# Patient Record
Sex: Female | Born: 1940
Health system: Southern US, Community
[De-identification: ages and names within clinical notes are randomized; demographics above are authoritative.]

## PROBLEM LIST (undated history)

## (undated) DIAGNOSIS — J309 Allergic rhinitis, unspecified: Secondary | ICD-10-CM

## (undated) DIAGNOSIS — E119 Type 2 diabetes mellitus without complications: Secondary | ICD-10-CM

## (undated) DIAGNOSIS — I10 Essential (primary) hypertension: Secondary | ICD-10-CM

## (undated) DIAGNOSIS — E785 Hyperlipidemia, unspecified: Secondary | ICD-10-CM

## (undated) HISTORY — DX: Allergic rhinitis, unspecified: J30.9

## (undated) HISTORY — PX: ABDOMINAL HYSTERECTOMY: SHX81

## (undated) HISTORY — PX: CATARACT EXTRACTION: SUR2

## (undated) HISTORY — DX: Hyperlipidemia, unspecified: E78.5

## (undated) HISTORY — PX: CHOLECYSTECTOMY: SHX55

## (undated) HISTORY — PX: ROTATOR CUFF REPAIR: SHX139

## (undated) HISTORY — DX: Type 2 diabetes mellitus without complications: E11.9

## (undated) HISTORY — DX: Essential (primary) hypertension: I10

---

## 2008-09-10 ENCOUNTER — Inpatient Hospital Stay (HOSPITAL_COMMUNITY): Admission: RE | Admit: 2008-09-10 | Discharge: 2008-09-13 | Payer: Self-pay | Admitting: Neurosurgery

## 2010-06-21 LAB — GLUCOSE, CAPILLARY
Glucose-Capillary: 138 mg/dL — ABNORMAL HIGH (ref 70–99)
Glucose-Capillary: 154 mg/dL — ABNORMAL HIGH (ref 70–99)
Glucose-Capillary: 196 mg/dL — ABNORMAL HIGH (ref 70–99)
Glucose-Capillary: 208 mg/dL — ABNORMAL HIGH (ref 70–99)
Glucose-Capillary: 243 mg/dL — ABNORMAL HIGH (ref 70–99)
Glucose-Capillary: 264 mg/dL — ABNORMAL HIGH (ref 70–99)

## 2010-06-22 LAB — CBC
MCV: 87.4 fL (ref 78.0–100.0)
Platelets: 278 10*3/uL (ref 150–400)
RBC: 4.63 MIL/uL (ref 3.87–5.11)
WBC: 7.7 10*3/uL (ref 4.0–10.5)

## 2010-06-22 LAB — GLUCOSE, CAPILLARY
Glucose-Capillary: 122 mg/dL — ABNORMAL HIGH (ref 70–99)
Glucose-Capillary: 125 mg/dL — ABNORMAL HIGH (ref 70–99)
Glucose-Capillary: 178 mg/dL — ABNORMAL HIGH (ref 70–99)
Glucose-Capillary: 381 mg/dL — ABNORMAL HIGH (ref 70–99)

## 2010-06-22 LAB — BASIC METABOLIC PANEL
BUN: 9 mg/dL (ref 6–23)
Chloride: 105 mEq/L (ref 96–112)
Creatinine, Ser: 0.68 mg/dL (ref 0.4–1.2)
GFR calc Af Amer: >60 mL/min (ref >60)
GFR calc non Af Amer: >60 mL/min (ref >60)
Potassium: 4.4 mEq/L (ref 3.5–5.1)

## 2010-07-28 NOTE — Discharge Summary (Signed)
NAME:  Rebecca Hodge, Rebecca Hodge               ACCOUNT NO.:  [id number]   MEDICAL RECORD NO.:  [id number]          PATIENT TYPE:  INP   LOCATION:  3031                         FACILITY:  [facility]   PHYSICIAN:  Hilda Lias, M.D.   DATE OF BIRTH:  06-15-1940   DATE OF ADMISSION:  09/10/2008  DATE OF DISCHARGE:  09/13/2008                               DISCHARGE SUMMARY   ADMISSION DIAGNOSIS:  Left L3-L4 herniated disk.   FINAL DIAGNOSIS:  Left L3-L4 herniated disk.   CLINICAL HISTORY:  The patient was in the office because of back and  left leg pain.  The patient has quite a bit of weakness of the left  quadriceps.  X-ray showed large herniated disk at L3-4.  Surgery was  advised.  Laboratory normal.   COURSE IN THE HOSPITAL:  The patient was taken to surgery and left L3-4  decompression was done.  After surgery, the patient had quite of pain  which resolved in 24 hours.  Today, she is walking, and she has minimal  back discomfort and no left leg pain.  She is being discharged today to  be followed by me in my office.   CONDITION ON DISCHARGE:  Improvement.   MEDICATIONS:  Percocet, diazepam, Neurontin.   DIET:  She will continue with preop diet.   ACTIVITY:  Not to drive for at least 2 weeks.   FOLLOWUP:  She will be seen in my office in 4 weeks or before as needed.            ______________________________  Hilda Lias, M.D.     EB/MEDQ  D:  09/13/2008  T:  09/14/2008  Job:  [id number]

## 2010-07-28 NOTE — Op Note (Signed)
NAME:  Rebecca Hodge, Rebecca Hodge               ACCOUNT NO.:  [id number]   MEDICAL RECORD NO.:  [id number]          PATIENT TYPE:  INP   LOCATION:  3031                         FACILITY:  [facility]   PHYSICIAN:  Hilda Lias, M.D.   DATE OF BIRTH:  05/26/1940   DATE OF PROCEDURE:  09/10/2008  DATE OF DISCHARGE:                               OPERATIVE REPORT   PREOPERATIVE DIAGNOSIS:  Left L3-L4 herniated disk.   POSTOPERATIVE DIAGNOSIS:  Left L3-L4 herniated disk.   PROCEDURES:  Left L3-L4 decompression of the L3 nerve root,  foraminotomy, removal of loose facet.  Microscope.   SURGEON:  Hilda Lias, MD.   CLINICAL HISTORY:  [name] is a lady who was seem in my office  yesterday complaining of back pain radiating to the left leg.  She had  failed conservative treatment.  X-ray showed that she has a herniated  disk at L3-4.  The patient wants to proceed with surgery because the  patient was telling me that the medication was not helping and she was  getting worse.  The surgery was fully explained to her and her daughter  including the possibility of no improvement, need for further surgery,  scar tissue, and CSF leak.  Also, possibility of infection.   PROCEDURE IN DETAIL:  The patient was taken to the OR and after  intubation, she was positioned on a prone manner.  The back was cleaned  with DuraPrep.  A midline incision from L3-L4 was made and muscle  retracted laterally.  X-ray was taken and showed that the clips were at  the level of L3-4 and the probe was at the level of L3-4.  We brought  the microscope into the area.  Immediately, we found that the L3 facet  was loose.  We did a laminotomy to remove the lower part of L3, the  upper of L4, and thick yellow ligament.  We tried to preserve the facet,  but it was loose and it was pushing backwards.  Removal was done.  Then,  we found that the L3 nerve root was displaced laterally.  There was a  fragment of disk plus a thick calcified  ligament compromising one of the  thecal sac as well that three L3 nerve root.  Removal of the ligament  plus some fragment was done.  We repeated the x-ray and indeed we were  in the right area.  With the probe, with look underneath and the thecal  sac of L3-1 one below L3 nerve root as well as down to L4, and there was  no evidence of any herniated disk or fragment.  There was quite a bit of  bulging disk and after we had the good decompression, there was no need  to do any diskectomy.  The area was irrigated.  Valsalva maneuver was  negative.  Then, fentanyl and Depo-Medrol were left in the epidural area  and the wound was closed with Vicryl and Steri-Strips.           ______________________________  Hilda Lias, M.D.     EB/MEDQ  D:  09/10/2008  T:  09/11/2008  Job:  [id number]

## 2010-08-13 IMAGING — CR DG CHEST 2V
2 series · 2 of 2 positions shown · non-contrast
Comparison: None

CLINICAL DATA: Preop for lumbar spine surgery

CHEST - 2 VIEW

[view not recorded (1 of 2)]
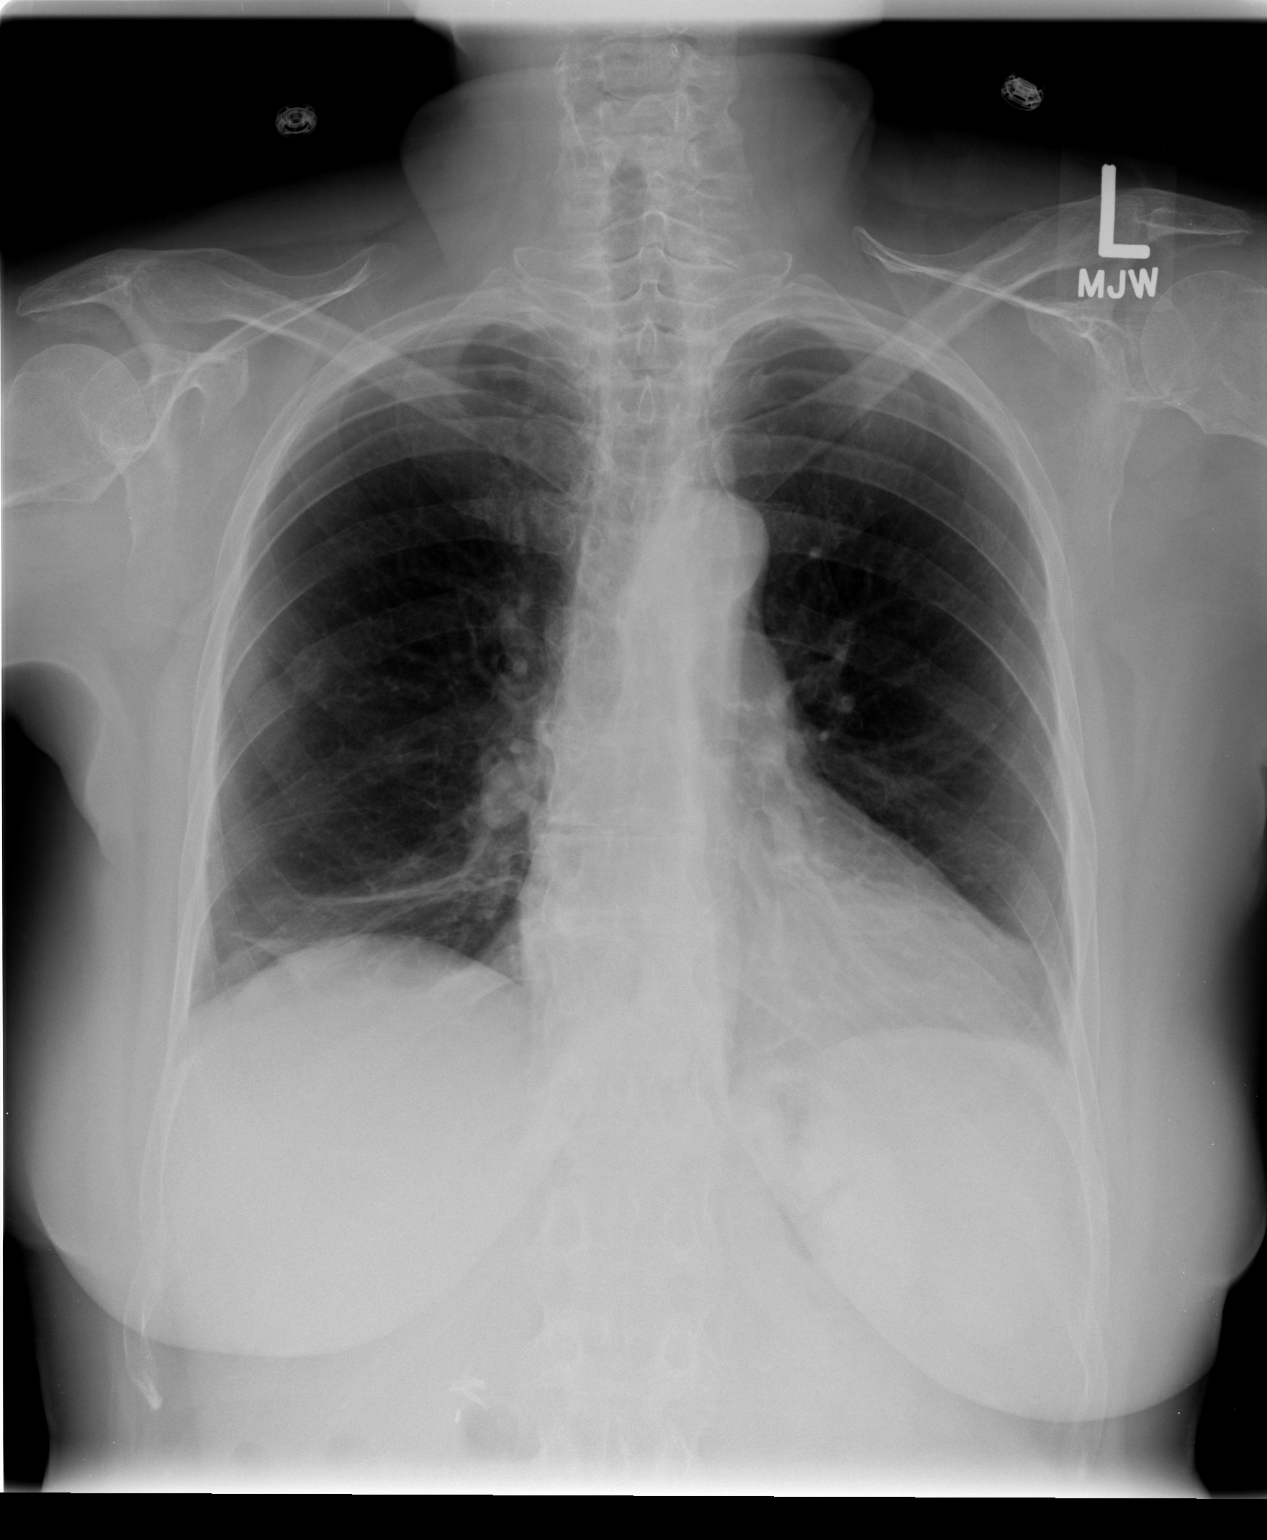

[view not recorded (2 of 2)]
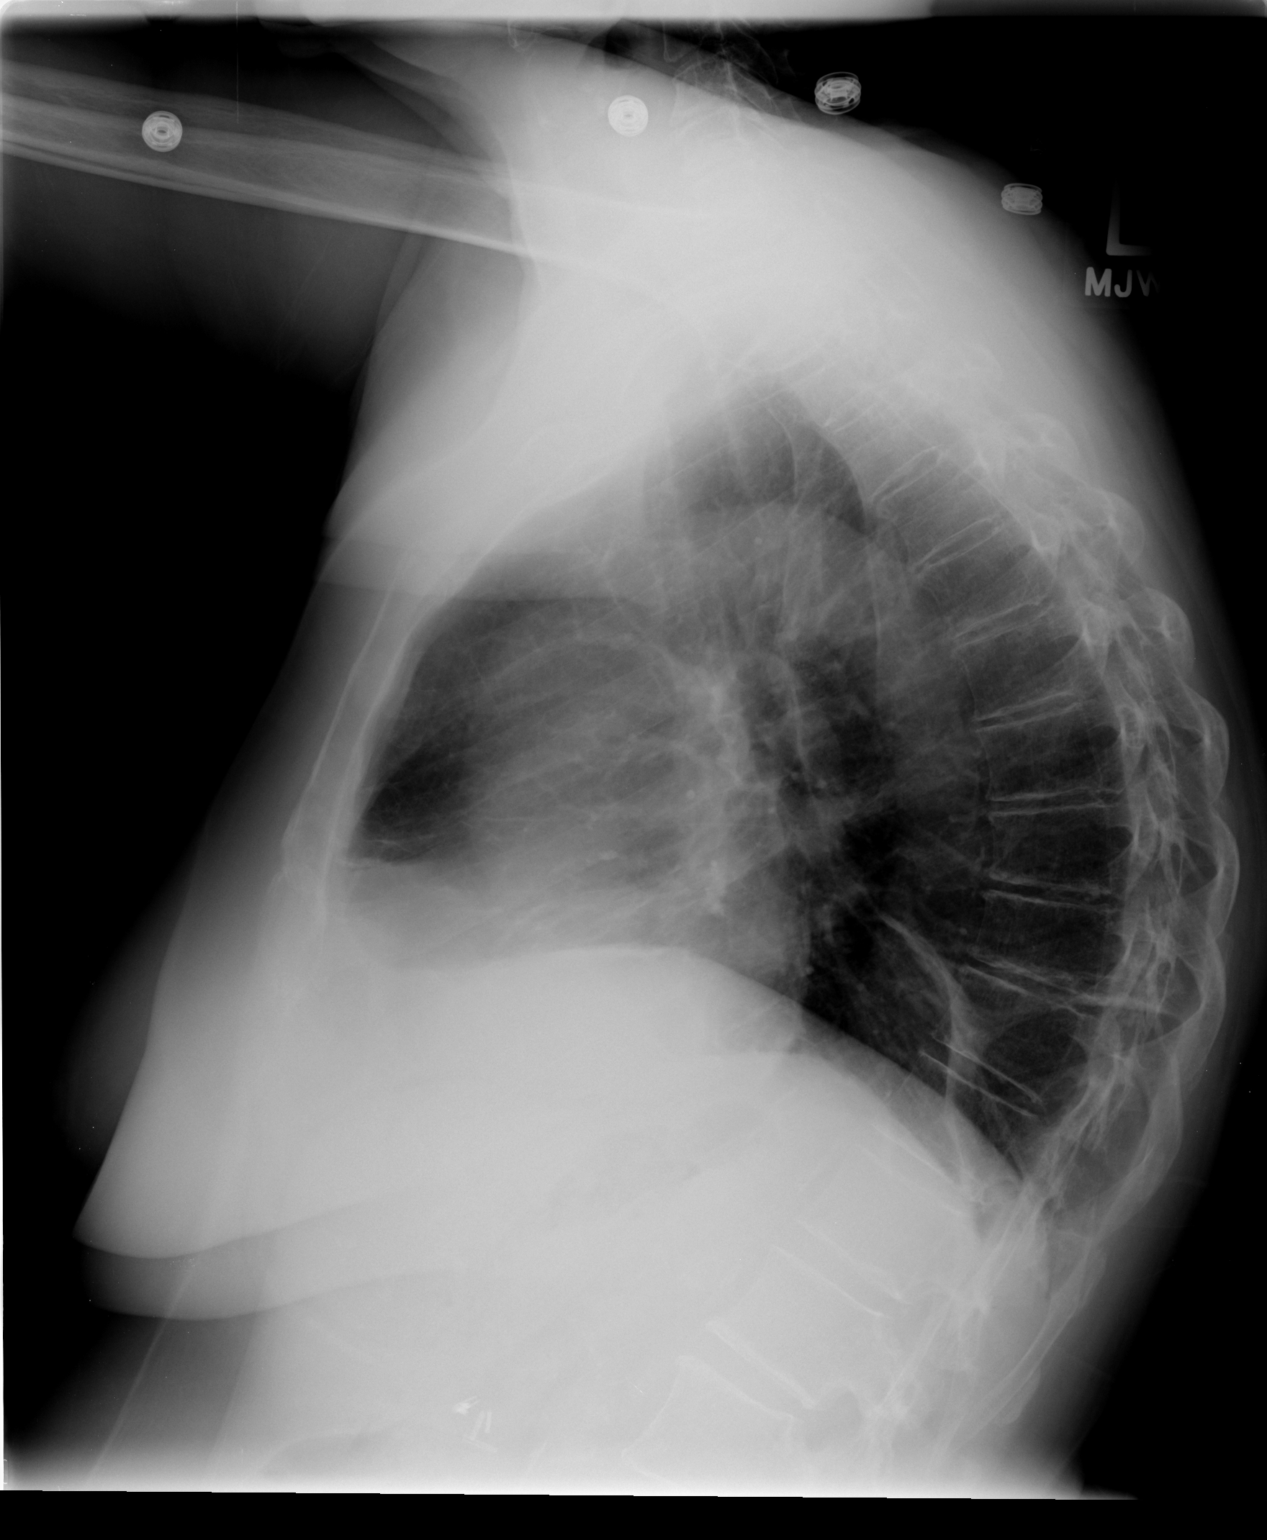

[2 of 2 positions shown; findings below may reference images not displayed]

FINDINGS: Linear opacity is noted at the lung bases most consistent
with atelectasis or scarring.  No focal infiltrate or effusion is
seen.  The heart is within upper limits of normal in size.  No
acute bony abnormality is seen.
IMPRESSION: Basilar opacities consistent with linear atelectasis or scarring.
No definite active process.

## 2010-08-13 IMAGING — CR DG LUMBAR SPINE 2-3V
1 series · 1 of 1 positions shown · non-contrast
Comparison: None

CLINICAL DATA: Herniated L3-4 disc.  Radiculopathy.

LUMBAR SPINE - 2-3 VIEW

[view not recorded]
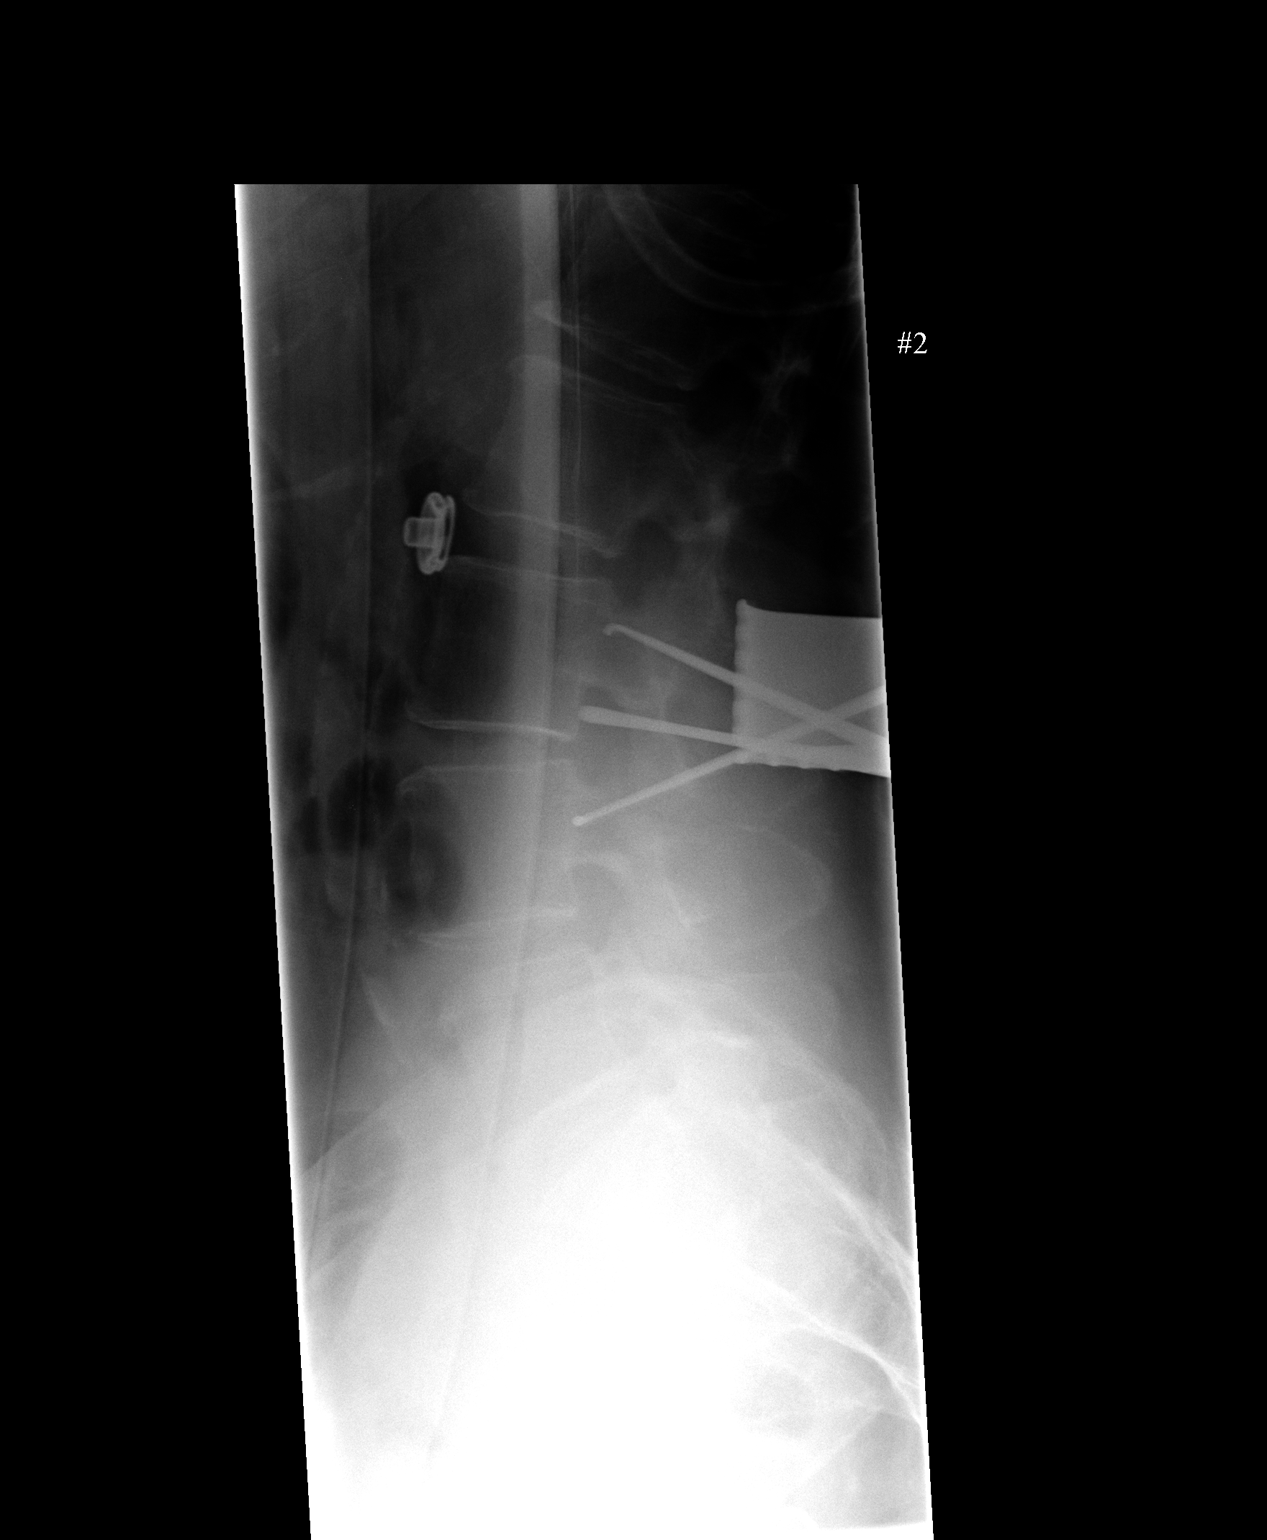

[1 of 1 positions shown; findings below may reference images not displayed]

FINDINGS: Radiograph #1 at [DATE] p.m. demonstrates instruments at
the L3-4 level.  Radiograph #2 at [DATE] p.m. demonstrates
instruments extending from the top of the pedicle of L3 to the mid
pedicle of L4.
IMPRESSION: Instruments at L3-4.

## 2011-03-17 DIAGNOSIS — E1169 Type 2 diabetes mellitus with other specified complication: Secondary | ICD-10-CM | POA: Diagnosis not present

## 2011-05-11 DIAGNOSIS — J441 Chronic obstructive pulmonary disease with (acute) exacerbation: Secondary | ICD-10-CM | POA: Diagnosis not present

## 2011-05-11 DIAGNOSIS — I10 Essential (primary) hypertension: Secondary | ICD-10-CM | POA: Diagnosis not present

## 2011-05-11 DIAGNOSIS — E785 Hyperlipidemia, unspecified: Secondary | ICD-10-CM | POA: Diagnosis not present

## 2011-05-11 DIAGNOSIS — E1169 Type 2 diabetes mellitus with other specified complication: Secondary | ICD-10-CM | POA: Diagnosis not present

## 2011-06-15 DIAGNOSIS — N390 Urinary tract infection, site not specified: Secondary | ICD-10-CM | POA: Diagnosis not present

## 2011-06-30 DIAGNOSIS — N390 Urinary tract infection, site not specified: Secondary | ICD-10-CM | POA: Diagnosis not present

## 2011-06-30 DIAGNOSIS — I739 Peripheral vascular disease, unspecified: Secondary | ICD-10-CM | POA: Diagnosis not present

## 2011-07-02 DIAGNOSIS — I739 Peripheral vascular disease, unspecified: Secondary | ICD-10-CM | POA: Diagnosis not present

## 2011-07-14 DIAGNOSIS — R252 Cramp and spasm: Secondary | ICD-10-CM | POA: Diagnosis not present

## 2011-08-13 DIAGNOSIS — E1149 Type 2 diabetes mellitus with other diabetic neurological complication: Secondary | ICD-10-CM | POA: Diagnosis not present

## 2011-08-13 DIAGNOSIS — Z79899 Other long term (current) drug therapy: Secondary | ICD-10-CM | POA: Diagnosis not present

## 2011-08-13 DIAGNOSIS — I10 Essential (primary) hypertension: Secondary | ICD-10-CM | POA: Diagnosis not present

## 2011-08-13 DIAGNOSIS — E1142 Type 2 diabetes mellitus with diabetic polyneuropathy: Secondary | ICD-10-CM | POA: Diagnosis not present

## 2011-08-13 DIAGNOSIS — E785 Hyperlipidemia, unspecified: Secondary | ICD-10-CM | POA: Diagnosis not present

## 2011-08-13 DIAGNOSIS — E782 Mixed hyperlipidemia: Secondary | ICD-10-CM | POA: Diagnosis not present

## 2011-08-31 DIAGNOSIS — I739 Peripheral vascular disease, unspecified: Secondary | ICD-10-CM | POA: Diagnosis not present

## 2011-08-31 DIAGNOSIS — I10 Essential (primary) hypertension: Secondary | ICD-10-CM | POA: Diagnosis not present

## 2011-11-17 DIAGNOSIS — E039 Hypothyroidism, unspecified: Secondary | ICD-10-CM | POA: Diagnosis not present

## 2011-11-17 DIAGNOSIS — I10 Essential (primary) hypertension: Secondary | ICD-10-CM | POA: Diagnosis not present

## 2011-11-17 DIAGNOSIS — Z79899 Other long term (current) drug therapy: Secondary | ICD-10-CM | POA: Diagnosis not present

## 2011-11-17 DIAGNOSIS — E1149 Type 2 diabetes mellitus with other diabetic neurological complication: Secondary | ICD-10-CM | POA: Diagnosis not present

## 2011-11-17 DIAGNOSIS — E785 Hyperlipidemia, unspecified: Secondary | ICD-10-CM | POA: Diagnosis not present

## 2011-11-17 DIAGNOSIS — E7800 Pure hypercholesterolemia, unspecified: Secondary | ICD-10-CM | POA: Diagnosis not present

## 2011-12-24 DIAGNOSIS — F0390 Unspecified dementia without behavioral disturbance: Secondary | ICD-10-CM | POA: Diagnosis not present

## 2012-02-23 DIAGNOSIS — I10 Essential (primary) hypertension: Secondary | ICD-10-CM | POA: Diagnosis not present

## 2012-02-23 DIAGNOSIS — E785 Hyperlipidemia, unspecified: Secondary | ICD-10-CM | POA: Diagnosis not present

## 2012-02-23 DIAGNOSIS — Z23 Encounter for immunization: Secondary | ICD-10-CM | POA: Diagnosis not present

## 2012-02-23 DIAGNOSIS — E1149 Type 2 diabetes mellitus with other diabetic neurological complication: Secondary | ICD-10-CM | POA: Diagnosis not present

## 2012-02-23 DIAGNOSIS — E119 Type 2 diabetes mellitus without complications: Secondary | ICD-10-CM | POA: Diagnosis not present

## 2012-02-23 DIAGNOSIS — I739 Peripheral vascular disease, unspecified: Secondary | ICD-10-CM | POA: Diagnosis not present

## 2012-02-23 DIAGNOSIS — E1142 Type 2 diabetes mellitus with diabetic polyneuropathy: Secondary | ICD-10-CM | POA: Diagnosis not present

## 2012-03-02 DIAGNOSIS — M542 Cervicalgia: Secondary | ICD-10-CM | POA: Diagnosis not present

## 2012-06-01 DIAGNOSIS — E785 Hyperlipidemia, unspecified: Secondary | ICD-10-CM | POA: Diagnosis not present

## 2012-06-01 DIAGNOSIS — E1149 Type 2 diabetes mellitus with other diabetic neurological complication: Secondary | ICD-10-CM | POA: Diagnosis not present

## 2012-06-01 DIAGNOSIS — I10 Essential (primary) hypertension: Secondary | ICD-10-CM | POA: Diagnosis not present

## 2012-06-01 DIAGNOSIS — E1142 Type 2 diabetes mellitus with diabetic polyneuropathy: Secondary | ICD-10-CM | POA: Diagnosis not present

## 2012-06-01 DIAGNOSIS — E119 Type 2 diabetes mellitus without complications: Secondary | ICD-10-CM | POA: Diagnosis not present

## 2012-06-02 DIAGNOSIS — Z1231 Encounter for screening mammogram for malignant neoplasm of breast: Secondary | ICD-10-CM | POA: Diagnosis not present

## 2012-06-07 DIAGNOSIS — E785 Hyperlipidemia, unspecified: Secondary | ICD-10-CM | POA: Diagnosis not present

## 2012-06-07 DIAGNOSIS — E119 Type 2 diabetes mellitus without complications: Secondary | ICD-10-CM | POA: Diagnosis not present

## 2012-06-07 DIAGNOSIS — I10 Essential (primary) hypertension: Secondary | ICD-10-CM | POA: Diagnosis not present

## 2012-06-07 DIAGNOSIS — E1149 Type 2 diabetes mellitus with other diabetic neurological complication: Secondary | ICD-10-CM | POA: Diagnosis not present

## 2012-06-13 DIAGNOSIS — K5289 Other specified noninfective gastroenteritis and colitis: Secondary | ICD-10-CM | POA: Diagnosis not present

## 2012-06-22 DIAGNOSIS — J309 Allergic rhinitis, unspecified: Secondary | ICD-10-CM | POA: Diagnosis not present

## 2012-07-03 DIAGNOSIS — R042 Hemoptysis: Secondary | ICD-10-CM | POA: Diagnosis not present

## 2012-07-24 DIAGNOSIS — J441 Chronic obstructive pulmonary disease with (acute) exacerbation: Secondary | ICD-10-CM | POA: Diagnosis not present

## 2012-07-31 DIAGNOSIS — J441 Chronic obstructive pulmonary disease with (acute) exacerbation: Secondary | ICD-10-CM | POA: Diagnosis not present

## 2012-08-02 DIAGNOSIS — I77810 Thoracic aortic ectasia: Secondary | ICD-10-CM | POA: Diagnosis not present

## 2012-08-02 DIAGNOSIS — R042 Hemoptysis: Secondary | ICD-10-CM | POA: Diagnosis not present

## 2012-10-06 DIAGNOSIS — E1142 Type 2 diabetes mellitus with diabetic polyneuropathy: Secondary | ICD-10-CM | POA: Diagnosis not present

## 2012-10-06 DIAGNOSIS — J449 Chronic obstructive pulmonary disease, unspecified: Secondary | ICD-10-CM | POA: Diagnosis not present

## 2012-10-06 DIAGNOSIS — E785 Hyperlipidemia, unspecified: Secondary | ICD-10-CM | POA: Diagnosis not present

## 2012-10-06 DIAGNOSIS — N390 Urinary tract infection, site not specified: Secondary | ICD-10-CM | POA: Diagnosis not present

## 2012-10-06 DIAGNOSIS — I10 Essential (primary) hypertension: Secondary | ICD-10-CM | POA: Diagnosis not present

## 2012-10-06 DIAGNOSIS — E1149 Type 2 diabetes mellitus with other diabetic neurological complication: Secondary | ICD-10-CM | POA: Diagnosis not present

## 2012-11-07 DIAGNOSIS — J449 Chronic obstructive pulmonary disease, unspecified: Secondary | ICD-10-CM | POA: Diagnosis not present

## 2012-11-07 DIAGNOSIS — I739 Peripheral vascular disease, unspecified: Secondary | ICD-10-CM | POA: Diagnosis not present

## 2012-11-07 DIAGNOSIS — K2970 Gastritis, unspecified, without bleeding: Secondary | ICD-10-CM | POA: Diagnosis not present

## 2012-11-07 DIAGNOSIS — N390 Urinary tract infection, site not specified: Secondary | ICD-10-CM | POA: Diagnosis not present

## 2012-11-07 DIAGNOSIS — IMO0001 Reserved for inherently not codable concepts without codable children: Secondary | ICD-10-CM | POA: Diagnosis not present

## 2012-11-07 DIAGNOSIS — R109 Unspecified abdominal pain: Secondary | ICD-10-CM | POA: Diagnosis not present

## 2012-11-07 DIAGNOSIS — R5381 Other malaise: Secondary | ICD-10-CM | POA: Diagnosis not present

## 2012-11-07 DIAGNOSIS — E1149 Type 2 diabetes mellitus with other diabetic neurological complication: Secondary | ICD-10-CM | POA: Diagnosis not present

## 2012-11-07 DIAGNOSIS — I10 Essential (primary) hypertension: Secondary | ICD-10-CM | POA: Diagnosis not present

## 2012-11-07 DIAGNOSIS — K449 Diaphragmatic hernia without obstruction or gangrene: Secondary | ICD-10-CM | POA: Diagnosis not present

## 2012-11-07 DIAGNOSIS — Z79899 Other long term (current) drug therapy: Secondary | ICD-10-CM | POA: Diagnosis not present

## 2012-11-07 DIAGNOSIS — E869 Volume depletion, unspecified: Secondary | ICD-10-CM | POA: Diagnosis not present

## 2012-11-07 DIAGNOSIS — R112 Nausea with vomiting, unspecified: Secondary | ICD-10-CM | POA: Diagnosis not present

## 2012-11-07 DIAGNOSIS — R1084 Generalized abdominal pain: Secondary | ICD-10-CM | POA: Diagnosis not present

## 2012-11-07 DIAGNOSIS — B91 Sequelae of poliomyelitis: Secondary | ICD-10-CM | POA: Diagnosis not present

## 2012-11-07 DIAGNOSIS — E0590 Thyrotoxicosis, unspecified without thyrotoxic crisis or storm: Secondary | ICD-10-CM | POA: Diagnosis not present

## 2012-11-07 DIAGNOSIS — R911 Solitary pulmonary nodule: Secondary | ICD-10-CM | POA: Diagnosis not present

## 2012-11-08 DIAGNOSIS — E869 Volume depletion, unspecified: Secondary | ICD-10-CM | POA: Diagnosis not present

## 2012-11-08 DIAGNOSIS — N390 Urinary tract infection, site not specified: Secondary | ICD-10-CM | POA: Diagnosis not present

## 2012-11-10 DIAGNOSIS — E869 Volume depletion, unspecified: Secondary | ICD-10-CM | POA: Diagnosis not present

## 2012-11-10 DIAGNOSIS — N390 Urinary tract infection, site not specified: Secondary | ICD-10-CM | POA: Diagnosis not present

## 2012-12-13 ENCOUNTER — Encounter: Payer: Self-pay | Admitting: Pulmonary Disease

## 2012-12-13 ENCOUNTER — Ambulatory Visit (INDEPENDENT_AMBULATORY_CARE_PROVIDER_SITE_OTHER): Payer: Medicare Other | Admitting: Pulmonary Disease

## 2012-12-13 VITALS — BP 120/82 | HR 62 | Temp 97.5°F | Ht 64.5 in | Wt 160.0 lb

## 2012-12-13 DIAGNOSIS — R911 Solitary pulmonary nodule: Secondary | ICD-10-CM | POA: Diagnosis not present

## 2012-12-13 NOTE — Assessment & Plan Note (Signed)
The patient has a 7 mm nodule noted in her right lower lobe on CT of her abdomen and pelvis, but it is well-circumscribed and does not have malignant characteristics.  The patient is essentially a never smoker, and is considered to be in a low risk fleischner group.  I have explained to her this is too small to biopsy, too small to characterize by PET scanning, and surgery would not be the best approach.  I think surveillance is her best option, and would recommend a followup scan in 6 months according to the Fleischner criteria.  Patient is agreeable to this approach.

## 2012-12-13 NOTE — Patient Instructions (Addendum)
Will schedule for a followup scan of your chest in 6mos.  If no change in the spot, would do one more a year or so after that.

## 2012-12-13 NOTE — Progress Notes (Signed)
  Subjective:    Patient ID: Rebecca Hodge, female    DOB: 1941/02/06, 72 y.o.   MRN: [id number]  HPI The patient is a 72 year old female who I've been asked to see for management of a pulmonary nodule.  She underwent a CT of her chest in May of this year, but did not show a definite pulmonary nodule.  She then had a CT of her abdomen and pelvis this month which clearly shows a 7 mm right lower lobe density.  In hindsight, this was probably present right at the top of the diaphragm on her chest CT in May.  The patient has only smoked for 5 years total in her life, and has never lived in [place] or [address].  She has no history of TB exposure, and has had a negative skin test in the past.  She tells me that her health maintenance is due at this time, but did have a colonoscopy approximately 5 years ago.  She does have a history of uterine cancer, but had a hysterectomy 20+ years ago.  The patient states that she is eating well, and has had stable weight.  She denies any cough or hemoptysis.   Review of Systems  Constitutional: Negative for fever and unexpected weight change.  HENT: Negative for ear pain, nosebleeds, congestion, sore throat, rhinorrhea, sneezing, trouble swallowing, dental problem, postnasal drip and sinus pressure.   Eyes: Negative for redness and itching.  Respiratory: Positive for shortness of breath. Negative for cough, chest tightness and wheezing.   Cardiovascular: Negative for palpitations and leg swelling.  Gastrointestinal: Negative for nausea and vomiting.  Genitourinary: Negative for dysuria.  Musculoskeletal: Positive for joint swelling.  Skin: Negative for rash.  Neurological: Negative for headaches.  Hematological: Does not bruise/bleed easily.  Psychiatric/Behavioral: Negative for dysphoric mood. The patient is not nervous/anxious.        Objective:   Physical Exam Constitutional:  Well developed, no acute distress  HENT:  Nares patent without  discharge  Oropharynx without exudate, palate and uvula are normal  Eyes:  Perrla, eomi, no scleral icterus  Neck:  No JVD, no TMG  Cardiovascular:  Normal rate, regular rhythm, no rubs or gallops.  No murmurs        Intact distal pulses  Pulmonary :  Normal breath sounds, no stridor or respiratory distress   No rales, rhonchi, or wheezing  Abdominal:  Soft, nondistended, bowel sounds present.  No tenderness noted.   Musculoskeletal: 1+ lower extremity edema noted.  Lymph Nodes:  No cervical lymphadenopathy noted  Skin:  No cyanosis noted  Neurologic:  Alert, appropriate, moves all 4 extremities without obvious deficit.         Assessment & Plan:

## 2013-01-18 DIAGNOSIS — S83429A Sprain of lateral collateral ligament of unspecified knee, initial encounter: Secondary | ICD-10-CM | POA: Diagnosis not present

## 2013-02-02 DIAGNOSIS — B9789 Other viral agents as the cause of diseases classified elsewhere: Secondary | ICD-10-CM | POA: Diagnosis not present

## 2013-02-06 DIAGNOSIS — J441 Chronic obstructive pulmonary disease with (acute) exacerbation: Secondary | ICD-10-CM | POA: Diagnosis not present

## 2013-02-12 DIAGNOSIS — M1710 Unilateral primary osteoarthritis, unspecified knee: Secondary | ICD-10-CM | POA: Diagnosis not present

## 2013-02-12 DIAGNOSIS — Z23 Encounter for immunization: Secondary | ICD-10-CM | POA: Diagnosis not present

## 2013-02-12 DIAGNOSIS — E785 Hyperlipidemia, unspecified: Secondary | ICD-10-CM | POA: Diagnosis not present

## 2013-02-12 DIAGNOSIS — N390 Urinary tract infection, site not specified: Secondary | ICD-10-CM | POA: Diagnosis not present

## 2013-02-12 DIAGNOSIS — R0789 Other chest pain: Secondary | ICD-10-CM | POA: Diagnosis not present

## 2013-02-12 DIAGNOSIS — I10 Essential (primary) hypertension: Secondary | ICD-10-CM | POA: Diagnosis not present

## 2013-02-12 DIAGNOSIS — E1149 Type 2 diabetes mellitus with other diabetic neurological complication: Secondary | ICD-10-CM | POA: Diagnosis not present

## 2013-02-19 DIAGNOSIS — Z0000 Encounter for general adult medical examination without abnormal findings: Secondary | ICD-10-CM | POA: Diagnosis not present

## 2013-02-19 DIAGNOSIS — R079 Chest pain, unspecified: Secondary | ICD-10-CM | POA: Diagnosis not present

## 2013-02-19 DIAGNOSIS — E785 Hyperlipidemia, unspecified: Secondary | ICD-10-CM | POA: Diagnosis not present

## 2013-02-19 DIAGNOSIS — K449 Diaphragmatic hernia without obstruction or gangrene: Secondary | ICD-10-CM | POA: Diagnosis not present

## 2013-02-19 DIAGNOSIS — Z809 Family history of malignant neoplasm, unspecified: Secondary | ICD-10-CM | POA: Diagnosis not present

## 2013-02-19 DIAGNOSIS — I10 Essential (primary) hypertension: Secondary | ICD-10-CM | POA: Diagnosis not present

## 2013-02-19 DIAGNOSIS — R0602 Shortness of breath: Secondary | ICD-10-CM | POA: Diagnosis not present

## 2013-02-19 DIAGNOSIS — E119 Type 2 diabetes mellitus without complications: Secondary | ICD-10-CM | POA: Diagnosis not present

## 2013-02-19 DIAGNOSIS — Z8249 Family history of ischemic heart disease and other diseases of the circulatory system: Secondary | ICD-10-CM | POA: Diagnosis not present

## 2013-02-23 DIAGNOSIS — E785 Hyperlipidemia, unspecified: Secondary | ICD-10-CM | POA: Diagnosis not present

## 2013-02-23 DIAGNOSIS — R0602 Shortness of breath: Secondary | ICD-10-CM | POA: Diagnosis not present

## 2013-02-23 DIAGNOSIS — Z886 Allergy status to analgesic agent status: Secondary | ICD-10-CM | POA: Diagnosis not present

## 2013-02-23 DIAGNOSIS — E119 Type 2 diabetes mellitus without complications: Secondary | ICD-10-CM | POA: Diagnosis not present

## 2013-02-23 DIAGNOSIS — I10 Essential (primary) hypertension: Secondary | ICD-10-CM | POA: Diagnosis not present

## 2013-02-23 DIAGNOSIS — R079 Chest pain, unspecified: Secondary | ICD-10-CM | POA: Diagnosis not present

## 2013-02-23 DIAGNOSIS — R072 Precordial pain: Secondary | ICD-10-CM | POA: Diagnosis not present

## 2013-02-23 DIAGNOSIS — M069 Rheumatoid arthritis, unspecified: Secondary | ICD-10-CM | POA: Diagnosis not present

## 2013-02-23 DIAGNOSIS — Z79899 Other long term (current) drug therapy: Secondary | ICD-10-CM | POA: Diagnosis not present

## 2013-02-23 DIAGNOSIS — Z9889 Other specified postprocedural states: Secondary | ICD-10-CM | POA: Diagnosis not present

## 2013-02-23 DIAGNOSIS — I209 Angina pectoris, unspecified: Secondary | ICD-10-CM | POA: Diagnosis not present

## 2013-02-23 DIAGNOSIS — Z882 Allergy status to sulfonamides status: Secondary | ICD-10-CM | POA: Diagnosis not present

## 2013-03-14 DIAGNOSIS — E119 Type 2 diabetes mellitus without complications: Secondary | ICD-10-CM | POA: Diagnosis not present

## 2013-03-14 DIAGNOSIS — R079 Chest pain, unspecified: Secondary | ICD-10-CM | POA: Diagnosis not present

## 2013-03-14 DIAGNOSIS — I2510 Atherosclerotic heart disease of native coronary artery without angina pectoris: Secondary | ICD-10-CM | POA: Diagnosis not present

## 2013-03-14 DIAGNOSIS — I10 Essential (primary) hypertension: Secondary | ICD-10-CM | POA: Diagnosis not present

## 2013-03-14 DIAGNOSIS — E785 Hyperlipidemia, unspecified: Secondary | ICD-10-CM | POA: Diagnosis not present

## 2013-03-27 DIAGNOSIS — L2089 Other atopic dermatitis: Secondary | ICD-10-CM | POA: Diagnosis not present

## 2013-04-30 DIAGNOSIS — E0590 Thyrotoxicosis, unspecified without thyrotoxic crisis or storm: Secondary | ICD-10-CM | POA: Diagnosis not present

## 2013-04-30 DIAGNOSIS — I10 Essential (primary) hypertension: Secondary | ICD-10-CM | POA: Diagnosis not present

## 2013-04-30 DIAGNOSIS — R1084 Generalized abdominal pain: Secondary | ICD-10-CM | POA: Diagnosis not present

## 2013-04-30 DIAGNOSIS — R197 Diarrhea, unspecified: Secondary | ICD-10-CM | POA: Diagnosis not present

## 2013-04-30 DIAGNOSIS — Z79899 Other long term (current) drug therapy: Secondary | ICD-10-CM | POA: Diagnosis not present

## 2013-04-30 DIAGNOSIS — N390 Urinary tract infection, site not specified: Secondary | ICD-10-CM | POA: Diagnosis not present

## 2013-04-30 DIAGNOSIS — K449 Diaphragmatic hernia without obstruction or gangrene: Secondary | ICD-10-CM | POA: Diagnosis not present

## 2013-04-30 DIAGNOSIS — J449 Chronic obstructive pulmonary disease, unspecified: Secondary | ICD-10-CM | POA: Diagnosis not present

## 2013-04-30 DIAGNOSIS — R112 Nausea with vomiting, unspecified: Secondary | ICD-10-CM | POA: Diagnosis not present

## 2013-05-14 DIAGNOSIS — A088 Other specified intestinal infections: Secondary | ICD-10-CM | POA: Diagnosis not present

## 2013-05-14 DIAGNOSIS — N390 Urinary tract infection, site not specified: Secondary | ICD-10-CM | POA: Diagnosis not present

## 2013-06-05 DIAGNOSIS — K449 Diaphragmatic hernia without obstruction or gangrene: Secondary | ICD-10-CM | POA: Diagnosis not present

## 2013-06-05 DIAGNOSIS — J984 Other disorders of lung: Secondary | ICD-10-CM | POA: Diagnosis not present

## 2013-06-05 DIAGNOSIS — I700 Atherosclerosis of aorta: Secondary | ICD-10-CM | POA: Diagnosis not present

## 2013-06-05 DIAGNOSIS — I77810 Thoracic aortic ectasia: Secondary | ICD-10-CM | POA: Diagnosis not present

## 2013-06-05 DIAGNOSIS — R911 Solitary pulmonary nodule: Secondary | ICD-10-CM | POA: Diagnosis not present

## 2013-06-11 DIAGNOSIS — L2089 Other atopic dermatitis: Secondary | ICD-10-CM | POA: Diagnosis not present

## 2013-06-19 DIAGNOSIS — L2089 Other atopic dermatitis: Secondary | ICD-10-CM | POA: Diagnosis not present

## 2013-06-20 DIAGNOSIS — R911 Solitary pulmonary nodule: Secondary | ICD-10-CM | POA: Diagnosis not present

## 2013-06-22 ENCOUNTER — Telehealth: Payer: Self-pay | Admitting: *Deleted

## 2013-06-22 ENCOUNTER — Encounter: Payer: Self-pay | Admitting: Pulmonary Disease

## 2013-06-22 NOTE — Telephone Encounter (Signed)
Unable to reach patient ATC x 2 # D/C-- no alternative contact.  Letter mailed to have patient contact our office ASAP for results.

## 2013-06-22 NOTE — Telephone Encounter (Signed)
CT report received from [facility] of patients 29mo f/u scan.  This is in your Brettney Ficken folder for review.

## 2013-06-22 NOTE — Telephone Encounter (Signed)
Let pt know that her ct chest shows the nodule to be unchanged.  Great news.  Needs one more followup in one year.  If stable, no further.  Have her call us next April to set up at [place].

## 2013-06-28 DIAGNOSIS — L981 Factitial dermatitis: Secondary | ICD-10-CM | POA: Diagnosis not present

## 2013-06-28 DIAGNOSIS — L259 Unspecified contact dermatitis, unspecified cause: Secondary | ICD-10-CM | POA: Diagnosis not present

## 2013-07-13 ENCOUNTER — Ambulatory Visit (INDEPENDENT_AMBULATORY_CARE_PROVIDER_SITE_OTHER): Payer: Medicare Other | Admitting: Pulmonary Disease

## 2013-07-13 ENCOUNTER — Encounter: Payer: Self-pay | Admitting: Pulmonary Disease

## 2013-07-13 VITALS — BP 142/86 | HR 77 | Ht 64.5 in | Wt 155.0 lb

## 2013-07-13 DIAGNOSIS — R911 Solitary pulmonary nodule: Secondary | ICD-10-CM | POA: Diagnosis not present

## 2013-07-13 NOTE — Patient Instructions (Signed)
You will need one more followup scan of your chest in one year.  Please call us next march/april and we can get this ordered.  Will call you with results.

## 2013-07-13 NOTE — Progress Notes (Signed)
   Subjective:    Patient ID: Rebecca Hodge, female    DOB: 07-Feb-1941, 73 y.o.   MRN: [id number]  HPI The patient comes in today for followup of her known pulmonary nodule. She has had a recent followup scan at the one-year [facility], and this shows no change in her right lower lobe nodule. I have reviewed the study with her in detail, and answered all of her questions.   Review of Systems  Constitutional: Negative for fever and unexpected weight change.  HENT: Negative for congestion, dental problem, ear pain, nosebleeds, postnasal drip, rhinorrhea, sinus pressure, sneezing, sore throat and trouble swallowing.   Eyes: Negative for redness and itching.  Respiratory: Positive for cough and shortness of breath. Negative for chest tightness and wheezing.   Cardiovascular: Negative for palpitations and leg swelling.  Gastrointestinal: Negative for nausea and vomiting.  Genitourinary: Negative for dysuria.  Musculoskeletal: Negative for joint swelling.  Skin: Negative for rash.  Neurological: Negative for headaches.  Hematological: Does not bruise/bleed easily.  Psychiatric/Behavioral: Negative for dysphoric mood. The patient is not nervous/anxious.        Objective:   Physical Exam Thin female in no acute distress Nose without purulence or discharge noted Lower extremities without edema, no cyanosis Alert and oriented, moves all 4 extremities.       Assessment & Plan:

## 2013-07-13 NOTE — Assessment & Plan Note (Signed)
The patient's nodule in the right lower lobe is stable from last year, and therefore needs one more followup next year. I've asked the patient to call us in March or April of next year to set this up, and also to keep up with her health maintenance.

## 2013-07-16 ENCOUNTER — Encounter: Payer: Self-pay | Admitting: Pulmonary Disease

## 2013-07-27 DIAGNOSIS — B91 Sequelae of poliomyelitis: Secondary | ICD-10-CM | POA: Diagnosis not present

## 2013-07-27 DIAGNOSIS — M1710 Unilateral primary osteoarthritis, unspecified knee: Secondary | ICD-10-CM | POA: Diagnosis not present

## 2013-07-27 DIAGNOSIS — Z9181 History of falling: Secondary | ICD-10-CM | POA: Diagnosis not present

## 2013-07-27 DIAGNOSIS — M6281 Muscle weakness (generalized): Secondary | ICD-10-CM | POA: Diagnosis not present

## 2013-07-27 DIAGNOSIS — IMO0002 Reserved for concepts with insufficient information to code with codable children: Secondary | ICD-10-CM | POA: Diagnosis not present

## 2013-08-13 DIAGNOSIS — I824Z9 Acute embolism and thrombosis of unspecified deep veins of unspecified distal lower extremity: Secondary | ICD-10-CM | POA: Diagnosis not present

## 2013-08-13 DIAGNOSIS — M79609 Pain in unspecified limb: Secondary | ICD-10-CM | POA: Diagnosis not present

## 2013-08-13 DIAGNOSIS — E119 Type 2 diabetes mellitus without complications: Secondary | ICD-10-CM | POA: Diagnosis not present

## 2013-08-13 DIAGNOSIS — Z79899 Other long term (current) drug therapy: Secondary | ICD-10-CM | POA: Diagnosis not present

## 2013-08-13 DIAGNOSIS — J449 Chronic obstructive pulmonary disease, unspecified: Secondary | ICD-10-CM | POA: Diagnosis not present

## 2013-08-13 DIAGNOSIS — Z794 Long term (current) use of insulin: Secondary | ICD-10-CM | POA: Diagnosis not present

## 2013-08-13 DIAGNOSIS — I10 Essential (primary) hypertension: Secondary | ICD-10-CM | POA: Diagnosis not present

## 2013-08-13 DIAGNOSIS — E0590 Thyrotoxicosis, unspecified without thyrotoxic crisis or storm: Secondary | ICD-10-CM | POA: Diagnosis not present

## 2013-08-13 DIAGNOSIS — I82409 Acute embolism and thrombosis of unspecified deep veins of unspecified lower extremity: Secondary | ICD-10-CM | POA: Diagnosis not present

## 2013-08-13 DIAGNOSIS — K219 Gastro-esophageal reflux disease without esophagitis: Secondary | ICD-10-CM | POA: Diagnosis not present

## 2013-08-15 DIAGNOSIS — I824Z9 Acute embolism and thrombosis of unspecified deep veins of unspecified distal lower extremity: Secondary | ICD-10-CM | POA: Diagnosis not present

## 2013-08-22 DIAGNOSIS — N390 Urinary tract infection, site not specified: Secondary | ICD-10-CM | POA: Diagnosis not present

## 2013-08-22 DIAGNOSIS — I82409 Acute embolism and thrombosis of unspecified deep veins of unspecified lower extremity: Secondary | ICD-10-CM | POA: Diagnosis not present

## 2013-08-30 DIAGNOSIS — I824Z9 Acute embolism and thrombosis of unspecified deep veins of unspecified distal lower extremity: Secondary | ICD-10-CM | POA: Diagnosis not present

## 2013-08-30 DIAGNOSIS — Z79899 Other long term (current) drug therapy: Secondary | ICD-10-CM | POA: Diagnosis not present

## 2013-09-07 DIAGNOSIS — L538 Other specified erythematous conditions: Secondary | ICD-10-CM | POA: Diagnosis not present

## 2013-09-07 DIAGNOSIS — Z79899 Other long term (current) drug therapy: Secondary | ICD-10-CM | POA: Diagnosis not present

## 2013-09-07 DIAGNOSIS — L0100 Impetigo, unspecified: Secondary | ICD-10-CM | POA: Diagnosis not present

## 2013-09-07 DIAGNOSIS — I824Z9 Acute embolism and thrombosis of unspecified deep veins of unspecified distal lower extremity: Secondary | ICD-10-CM | POA: Diagnosis not present

## 2013-09-19 DIAGNOSIS — I824Z9 Acute embolism and thrombosis of unspecified deep veins of unspecified distal lower extremity: Secondary | ICD-10-CM | POA: Diagnosis not present

## 2013-09-19 DIAGNOSIS — Z6829 Body mass index (BMI) 29.0-29.9, adult: Secondary | ICD-10-CM | POA: Diagnosis not present

## 2013-09-19 DIAGNOSIS — Z79899 Other long term (current) drug therapy: Secondary | ICD-10-CM | POA: Diagnosis not present

## 2013-10-16 DIAGNOSIS — Z1231 Encounter for screening mammogram for malignant neoplasm of breast: Secondary | ICD-10-CM | POA: Diagnosis not present

## 2013-10-17 DIAGNOSIS — I824Z9 Acute embolism and thrombosis of unspecified deep veins of unspecified distal lower extremity: Secondary | ICD-10-CM | POA: Diagnosis not present

## 2013-11-05 DIAGNOSIS — I824Z9 Acute embolism and thrombosis of unspecified deep veins of unspecified distal lower extremity: Secondary | ICD-10-CM | POA: Diagnosis not present

## 2013-12-27 DIAGNOSIS — E785 Hyperlipidemia, unspecified: Secondary | ICD-10-CM | POA: Diagnosis not present

## 2013-12-27 DIAGNOSIS — Z6828 Body mass index (BMI) 28.0-28.9, adult: Secondary | ICD-10-CM | POA: Diagnosis not present

## 2013-12-27 DIAGNOSIS — I10 Essential (primary) hypertension: Secondary | ICD-10-CM | POA: Diagnosis not present

## 2013-12-27 DIAGNOSIS — E1140 Type 2 diabetes mellitus with diabetic neuropathy, unspecified: Secondary | ICD-10-CM | POA: Diagnosis not present

## 2013-12-27 DIAGNOSIS — B91 Sequelae of poliomyelitis: Secondary | ICD-10-CM | POA: Diagnosis not present

## 2013-12-27 DIAGNOSIS — E1142 Type 2 diabetes mellitus with diabetic polyneuropathy: Secondary | ICD-10-CM | POA: Diagnosis not present

## 2013-12-27 DIAGNOSIS — Z23 Encounter for immunization: Secondary | ICD-10-CM | POA: Diagnosis not present

## 2013-12-29 DIAGNOSIS — Z23 Encounter for immunization: Secondary | ICD-10-CM | POA: Diagnosis not present

## 2014-01-21 DIAGNOSIS — I82409 Acute embolism and thrombosis of unspecified deep veins of unspecified lower extremity: Secondary | ICD-10-CM | POA: Diagnosis not present

## 2014-01-25 DIAGNOSIS — N390 Urinary tract infection, site not specified: Secondary | ICD-10-CM | POA: Diagnosis not present

## 2014-01-25 DIAGNOSIS — Z6828 Body mass index (BMI) 28.0-28.9, adult: Secondary | ICD-10-CM | POA: Diagnosis not present

## 2014-01-25 DIAGNOSIS — R413 Other amnesia: Secondary | ICD-10-CM | POA: Diagnosis not present

## 2014-01-25 DIAGNOSIS — R300 Dysuria: Secondary | ICD-10-CM | POA: Diagnosis not present

## 2014-01-25 DIAGNOSIS — R17 Unspecified jaundice: Secondary | ICD-10-CM | POA: Diagnosis not present

## 2014-02-08 DIAGNOSIS — K839 Disease of biliary tract, unspecified: Secondary | ICD-10-CM | POA: Diagnosis not present

## 2014-02-08 DIAGNOSIS — R17 Unspecified jaundice: Secondary | ICD-10-CM | POA: Diagnosis not present

## 2014-02-11 DIAGNOSIS — K862 Cyst of pancreas: Secondary | ICD-10-CM | POA: Diagnosis not present

## 2014-02-11 DIAGNOSIS — Z6828 Body mass index (BMI) 28.0-28.9, adult: Secondary | ICD-10-CM | POA: Diagnosis not present

## 2014-02-13 DIAGNOSIS — K449 Diaphragmatic hernia without obstruction or gangrene: Secondary | ICD-10-CM | POA: Diagnosis not present

## 2014-02-13 DIAGNOSIS — K862 Cyst of pancreas: Secondary | ICD-10-CM | POA: Diagnosis not present

## 2014-02-13 DIAGNOSIS — R1010 Upper abdominal pain, unspecified: Secondary | ICD-10-CM | POA: Diagnosis not present

## 2014-02-21 DIAGNOSIS — Z79899 Other long term (current) drug therapy: Secondary | ICD-10-CM | POA: Diagnosis not present

## 2014-02-21 DIAGNOSIS — Z7901 Long term (current) use of anticoagulants: Secondary | ICD-10-CM | POA: Diagnosis not present

## 2014-02-25 DIAGNOSIS — R300 Dysuria: Secondary | ICD-10-CM | POA: Diagnosis not present

## 2014-02-25 DIAGNOSIS — K862 Cyst of pancreas: Secondary | ICD-10-CM | POA: Diagnosis not present

## 2014-02-25 DIAGNOSIS — Z6828 Body mass index (BMI) 28.0-28.9, adult: Secondary | ICD-10-CM | POA: Diagnosis not present

## 2014-05-03 DIAGNOSIS — E785 Hyperlipidemia, unspecified: Secondary | ICD-10-CM | POA: Diagnosis not present

## 2014-05-03 DIAGNOSIS — E1142 Type 2 diabetes mellitus with diabetic polyneuropathy: Secondary | ICD-10-CM | POA: Diagnosis not present

## 2014-05-03 DIAGNOSIS — B91 Sequelae of poliomyelitis: Secondary | ICD-10-CM | POA: Diagnosis not present

## 2014-05-03 DIAGNOSIS — I10 Essential (primary) hypertension: Secondary | ICD-10-CM | POA: Diagnosis not present

## 2014-05-03 DIAGNOSIS — E1140 Type 2 diabetes mellitus with diabetic neuropathy, unspecified: Secondary | ICD-10-CM | POA: Diagnosis not present

## 2014-05-30 DIAGNOSIS — H268 Other specified cataract: Secondary | ICD-10-CM | POA: Diagnosis not present

## 2014-06-13 DIAGNOSIS — H2511 Age-related nuclear cataract, right eye: Secondary | ICD-10-CM | POA: Diagnosis not present

## 2014-06-13 DIAGNOSIS — H3531 Nonexudative age-related macular degeneration: Secondary | ICD-10-CM | POA: Diagnosis not present

## 2014-07-05 DIAGNOSIS — H269 Unspecified cataract: Secondary | ICD-10-CM | POA: Diagnosis not present

## 2014-07-22 DIAGNOSIS — H269 Unspecified cataract: Secondary | ICD-10-CM | POA: Diagnosis not present

## 2014-07-22 DIAGNOSIS — H25811 Combined forms of age-related cataract, right eye: Secondary | ICD-10-CM | POA: Diagnosis not present

## 2014-07-22 DIAGNOSIS — H268 Other specified cataract: Secondary | ICD-10-CM | POA: Diagnosis not present

## 2014-09-05 DIAGNOSIS — K219 Gastro-esophageal reflux disease without esophagitis: Secondary | ICD-10-CM | POA: Diagnosis not present

## 2014-09-05 DIAGNOSIS — J449 Chronic obstructive pulmonary disease, unspecified: Secondary | ICD-10-CM | POA: Diagnosis not present

## 2014-09-05 DIAGNOSIS — Z6827 Body mass index (BMI) 27.0-27.9, adult: Secondary | ICD-10-CM | POA: Diagnosis not present

## 2014-09-05 DIAGNOSIS — I10 Essential (primary) hypertension: Secondary | ICD-10-CM | POA: Diagnosis not present

## 2014-09-05 DIAGNOSIS — E1149 Type 2 diabetes mellitus with other diabetic neurological complication: Secondary | ICD-10-CM | POA: Diagnosis not present

## 2014-09-05 DIAGNOSIS — Z9189 Other specified personal risk factors, not elsewhere classified: Secondary | ICD-10-CM | POA: Diagnosis not present

## 2014-09-05 DIAGNOSIS — E785 Hyperlipidemia, unspecified: Secondary | ICD-10-CM | POA: Diagnosis not present

## 2014-09-09 DIAGNOSIS — H2512 Age-related nuclear cataract, left eye: Secondary | ICD-10-CM | POA: Diagnosis not present

## 2014-09-09 DIAGNOSIS — H25812 Combined forms of age-related cataract, left eye: Secondary | ICD-10-CM | POA: Diagnosis not present

## 2014-11-17 DIAGNOSIS — S098XXA Other specified injuries of head, initial encounter: Secondary | ICD-10-CM | POA: Diagnosis not present

## 2014-11-17 DIAGNOSIS — M542 Cervicalgia: Secondary | ICD-10-CM | POA: Diagnosis not present

## 2014-11-17 DIAGNOSIS — S0990XA Unspecified injury of head, initial encounter: Secondary | ICD-10-CM | POA: Diagnosis not present

## 2014-11-17 DIAGNOSIS — R51 Headache: Secondary | ICD-10-CM | POA: Diagnosis not present

## 2014-11-17 DIAGNOSIS — S161XXA Strain of muscle, fascia and tendon at neck level, initial encounter: Secondary | ICD-10-CM | POA: Diagnosis not present

## 2014-11-27 DIAGNOSIS — Z6827 Body mass index (BMI) 27.0-27.9, adult: Secondary | ICD-10-CM | POA: Diagnosis not present

## 2014-11-27 DIAGNOSIS — M47812 Spondylosis without myelopathy or radiculopathy, cervical region: Secondary | ICD-10-CM | POA: Diagnosis not present

## 2014-11-27 DIAGNOSIS — S199XXA Unspecified injury of neck, initial encounter: Secondary | ICD-10-CM | POA: Diagnosis not present

## 2014-12-11 DIAGNOSIS — Z23 Encounter for immunization: Secondary | ICD-10-CM | POA: Diagnosis not present

## 2014-12-11 DIAGNOSIS — K589 Irritable bowel syndrome without diarrhea: Secondary | ICD-10-CM | POA: Diagnosis not present

## 2014-12-16 DIAGNOSIS — E860 Dehydration: Secondary | ICD-10-CM | POA: Diagnosis not present

## 2014-12-16 DIAGNOSIS — R109 Unspecified abdominal pain: Secondary | ICD-10-CM | POA: Diagnosis not present

## 2014-12-16 DIAGNOSIS — J159 Unspecified bacterial pneumonia: Secondary | ICD-10-CM | POA: Diagnosis not present

## 2014-12-16 DIAGNOSIS — R531 Weakness: Secondary | ICD-10-CM | POA: Diagnosis not present

## 2014-12-16 DIAGNOSIS — E039 Hypothyroidism, unspecified: Secondary | ICD-10-CM | POA: Diagnosis present

## 2014-12-16 DIAGNOSIS — J449 Chronic obstructive pulmonary disease, unspecified: Secondary | ICD-10-CM | POA: Diagnosis present

## 2014-12-16 DIAGNOSIS — M79651 Pain in right thigh: Secondary | ICD-10-CM | POA: Diagnosis not present

## 2014-12-16 DIAGNOSIS — Z882 Allergy status to sulfonamides status: Secondary | ICD-10-CM | POA: Diagnosis not present

## 2014-12-16 DIAGNOSIS — A029 Salmonella infection, unspecified: Secondary | ICD-10-CM | POA: Diagnosis present

## 2014-12-16 DIAGNOSIS — Z6827 Body mass index (BMI) 27.0-27.9, adult: Secondary | ICD-10-CM | POA: Diagnosis not present

## 2014-12-16 DIAGNOSIS — Z885 Allergy status to narcotic agent status: Secondary | ICD-10-CM | POA: Diagnosis not present

## 2014-12-16 DIAGNOSIS — A419 Sepsis, unspecified organism: Secondary | ICD-10-CM | POA: Diagnosis not present

## 2014-12-16 DIAGNOSIS — R52 Pain, unspecified: Secondary | ICD-10-CM | POA: Diagnosis not present

## 2014-12-16 DIAGNOSIS — R410 Disorientation, unspecified: Secondary | ICD-10-CM | POA: Diagnosis present

## 2014-12-16 DIAGNOSIS — F329 Major depressive disorder, single episode, unspecified: Secondary | ICD-10-CM | POA: Diagnosis present

## 2014-12-16 DIAGNOSIS — Z87891 Personal history of nicotine dependence: Secondary | ICD-10-CM | POA: Diagnosis not present

## 2014-12-16 DIAGNOSIS — R509 Fever, unspecified: Secondary | ICD-10-CM | POA: Diagnosis not present

## 2014-12-16 DIAGNOSIS — M1990 Unspecified osteoarthritis, unspecified site: Secondary | ICD-10-CM | POA: Diagnosis present

## 2014-12-16 DIAGNOSIS — N10 Acute tubulo-interstitial nephritis: Secondary | ICD-10-CM | POA: Diagnosis not present

## 2014-12-16 DIAGNOSIS — R079 Chest pain, unspecified: Secondary | ICD-10-CM | POA: Diagnosis not present

## 2014-12-16 DIAGNOSIS — A09 Infectious gastroenteritis and colitis, unspecified: Secondary | ICD-10-CM | POA: Diagnosis not present

## 2014-12-16 DIAGNOSIS — Z888 Allergy status to other drugs, medicaments and biological substances status: Secondary | ICD-10-CM | POA: Diagnosis not present

## 2014-12-16 DIAGNOSIS — E119 Type 2 diabetes mellitus without complications: Secondary | ICD-10-CM | POA: Diagnosis present

## 2014-12-16 DIAGNOSIS — Z79899 Other long term (current) drug therapy: Secondary | ICD-10-CM | POA: Diagnosis not present

## 2014-12-16 DIAGNOSIS — E0590 Thyrotoxicosis, unspecified without thyrotoxic crisis or storm: Secondary | ICD-10-CM | POA: Diagnosis present

## 2014-12-16 DIAGNOSIS — J189 Pneumonia, unspecified organism: Secondary | ICD-10-CM | POA: Diagnosis not present

## 2014-12-16 DIAGNOSIS — K219 Gastro-esophageal reflux disease without esophagitis: Secondary | ICD-10-CM | POA: Diagnosis present

## 2014-12-16 DIAGNOSIS — J181 Lobar pneumonia, unspecified organism: Secondary | ICD-10-CM | POA: Diagnosis not present

## 2014-12-16 DIAGNOSIS — C55 Malignant neoplasm of uterus, part unspecified: Secondary | ICD-10-CM | POA: Diagnosis present

## 2014-12-16 DIAGNOSIS — Z794 Long term (current) use of insulin: Secondary | ICD-10-CM | POA: Diagnosis not present

## 2014-12-16 DIAGNOSIS — R197 Diarrhea, unspecified: Secondary | ICD-10-CM | POA: Diagnosis not present

## 2014-12-16 DIAGNOSIS — I10 Essential (primary) hypertension: Secondary | ICD-10-CM | POA: Diagnosis present

## 2014-12-17 DIAGNOSIS — R410 Disorientation, unspecified: Secondary | ICD-10-CM | POA: Diagnosis not present

## 2014-12-17 DIAGNOSIS — E860 Dehydration: Secondary | ICD-10-CM | POA: Diagnosis not present

## 2014-12-17 DIAGNOSIS — J159 Unspecified bacterial pneumonia: Secondary | ICD-10-CM | POA: Diagnosis not present

## 2014-12-17 DIAGNOSIS — A09 Infectious gastroenteritis and colitis, unspecified: Secondary | ICD-10-CM | POA: Diagnosis not present

## 2014-12-17 DIAGNOSIS — R079 Chest pain, unspecified: Secondary | ICD-10-CM | POA: Diagnosis not present

## 2014-12-18 DIAGNOSIS — A09 Infectious gastroenteritis and colitis, unspecified: Secondary | ICD-10-CM | POA: Diagnosis not present

## 2014-12-18 DIAGNOSIS — E860 Dehydration: Secondary | ICD-10-CM | POA: Diagnosis not present

## 2014-12-18 DIAGNOSIS — J159 Unspecified bacterial pneumonia: Secondary | ICD-10-CM | POA: Diagnosis not present

## 2014-12-18 DIAGNOSIS — J189 Pneumonia, unspecified organism: Secondary | ICD-10-CM | POA: Diagnosis not present

## 2014-12-18 DIAGNOSIS — R410 Disorientation, unspecified: Secondary | ICD-10-CM | POA: Diagnosis not present

## 2014-12-19 DIAGNOSIS — A09 Infectious gastroenteritis and colitis, unspecified: Secondary | ICD-10-CM | POA: Diagnosis not present

## 2014-12-19 DIAGNOSIS — R410 Disorientation, unspecified: Secondary | ICD-10-CM | POA: Diagnosis not present

## 2014-12-19 DIAGNOSIS — E860 Dehydration: Secondary | ICD-10-CM | POA: Diagnosis not present

## 2014-12-19 DIAGNOSIS — J159 Unspecified bacterial pneumonia: Secondary | ICD-10-CM | POA: Diagnosis not present

## 2014-12-25 DIAGNOSIS — K222 Esophageal obstruction: Secondary | ICD-10-CM | POA: Diagnosis not present

## 2014-12-25 DIAGNOSIS — A020 Salmonella enteritis: Secondary | ICD-10-CM | POA: Diagnosis not present

## 2014-12-31 DIAGNOSIS — K210 Gastro-esophageal reflux disease with esophagitis: Secondary | ICD-10-CM | POA: Diagnosis not present

## 2014-12-31 DIAGNOSIS — R1310 Dysphagia, unspecified: Secondary | ICD-10-CM | POA: Diagnosis not present

## 2015-01-02 DIAGNOSIS — S199XXD Unspecified injury of neck, subsequent encounter: Secondary | ICD-10-CM | POA: Diagnosis not present

## 2015-01-02 DIAGNOSIS — M542 Cervicalgia: Secondary | ICD-10-CM | POA: Diagnosis not present

## 2015-01-06 DIAGNOSIS — S199XXD Unspecified injury of neck, subsequent encounter: Secondary | ICD-10-CM | POA: Diagnosis not present

## 2015-01-06 DIAGNOSIS — M542 Cervicalgia: Secondary | ICD-10-CM | POA: Diagnosis not present

## 2015-01-08 DIAGNOSIS — E785 Hyperlipidemia, unspecified: Secondary | ICD-10-CM | POA: Diagnosis not present

## 2015-01-08 DIAGNOSIS — Z6827 Body mass index (BMI) 27.0-27.9, adult: Secondary | ICD-10-CM | POA: Diagnosis not present

## 2015-01-08 DIAGNOSIS — E1142 Type 2 diabetes mellitus with diabetic polyneuropathy: Secondary | ICD-10-CM | POA: Diagnosis not present

## 2015-01-08 DIAGNOSIS — J449 Chronic obstructive pulmonary disease, unspecified: Secondary | ICD-10-CM | POA: Diagnosis not present

## 2015-01-08 DIAGNOSIS — I10 Essential (primary) hypertension: Secondary | ICD-10-CM | POA: Diagnosis not present

## 2015-01-09 DIAGNOSIS — M542 Cervicalgia: Secondary | ICD-10-CM | POA: Diagnosis not present

## 2015-01-09 DIAGNOSIS — S199XXD Unspecified injury of neck, subsequent encounter: Secondary | ICD-10-CM | POA: Diagnosis not present

## 2015-01-13 DIAGNOSIS — Q393 Congenital stenosis and stricture of esophagus: Secondary | ICD-10-CM | POA: Diagnosis not present

## 2015-01-13 DIAGNOSIS — E119 Type 2 diabetes mellitus without complications: Secondary | ICD-10-CM | POA: Diagnosis not present

## 2015-01-13 DIAGNOSIS — J449 Chronic obstructive pulmonary disease, unspecified: Secondary | ICD-10-CM | POA: Diagnosis not present

## 2015-01-13 DIAGNOSIS — K219 Gastro-esophageal reflux disease without esophagitis: Secondary | ICD-10-CM | POA: Diagnosis not present

## 2015-01-13 DIAGNOSIS — R1310 Dysphagia, unspecified: Secondary | ICD-10-CM | POA: Diagnosis not present

## 2015-01-13 DIAGNOSIS — Z8542 Personal history of malignant neoplasm of other parts of uterus: Secondary | ICD-10-CM | POA: Diagnosis not present

## 2015-01-13 DIAGNOSIS — K449 Diaphragmatic hernia without obstruction or gangrene: Secondary | ICD-10-CM | POA: Diagnosis not present

## 2015-01-13 DIAGNOSIS — Z9049 Acquired absence of other specified parts of digestive tract: Secondary | ICD-10-CM | POA: Diagnosis not present

## 2015-01-13 DIAGNOSIS — Z87891 Personal history of nicotine dependence: Secondary | ICD-10-CM | POA: Diagnosis not present

## 2015-01-13 DIAGNOSIS — K2970 Gastritis, unspecified, without bleeding: Secondary | ICD-10-CM | POA: Diagnosis not present

## 2015-01-13 DIAGNOSIS — E7800 Pure hypercholesterolemia, unspecified: Secondary | ICD-10-CM | POA: Diagnosis not present

## 2015-01-13 DIAGNOSIS — Z7984 Long term (current) use of oral hypoglycemic drugs: Secondary | ICD-10-CM | POA: Diagnosis not present

## 2015-01-13 DIAGNOSIS — E0590 Thyrotoxicosis, unspecified without thyrotoxic crisis or storm: Secondary | ICD-10-CM | POA: Diagnosis not present

## 2015-01-13 DIAGNOSIS — K210 Gastro-esophageal reflux disease with esophagitis: Secondary | ICD-10-CM | POA: Diagnosis not present

## 2015-01-13 DIAGNOSIS — K222 Esophageal obstruction: Secondary | ICD-10-CM | POA: Diagnosis not present

## 2015-01-13 DIAGNOSIS — I10 Essential (primary) hypertension: Secondary | ICD-10-CM | POA: Diagnosis not present

## 2015-01-16 DIAGNOSIS — S199XXD Unspecified injury of neck, subsequent encounter: Secondary | ICD-10-CM | POA: Diagnosis not present

## 2015-01-16 DIAGNOSIS — M542 Cervicalgia: Secondary | ICD-10-CM | POA: Diagnosis not present

## 2015-01-23 DIAGNOSIS — Z6827 Body mass index (BMI) 27.0-27.9, adult: Secondary | ICD-10-CM | POA: Diagnosis not present

## 2015-01-23 DIAGNOSIS — S51802A Unspecified open wound of left forearm, initial encounter: Secondary | ICD-10-CM | POA: Diagnosis not present

## 2015-01-27 DIAGNOSIS — M542 Cervicalgia: Secondary | ICD-10-CM | POA: Diagnosis not present

## 2015-01-27 DIAGNOSIS — S199XXD Unspecified injury of neck, subsequent encounter: Secondary | ICD-10-CM | POA: Diagnosis not present

## 2015-02-03 DIAGNOSIS — Z6828 Body mass index (BMI) 28.0-28.9, adult: Secondary | ICD-10-CM | POA: Diagnosis not present

## 2015-02-03 DIAGNOSIS — S51802D Unspecified open wound of left forearm, subsequent encounter: Secondary | ICD-10-CM | POA: Diagnosis not present

## 2015-03-21 DIAGNOSIS — S51802A Unspecified open wound of left forearm, initial encounter: Secondary | ICD-10-CM | POA: Diagnosis not present

## 2015-04-01 DIAGNOSIS — H1010 Acute atopic conjunctivitis, unspecified eye: Secondary | ICD-10-CM | POA: Diagnosis not present

## 2015-04-01 DIAGNOSIS — J309 Allergic rhinitis, unspecified: Secondary | ICD-10-CM | POA: Diagnosis not present

## 2015-04-01 DIAGNOSIS — Z6828 Body mass index (BMI) 28.0-28.9, adult: Secondary | ICD-10-CM | POA: Diagnosis not present

## 2015-04-08 DIAGNOSIS — L820 Inflamed seborrheic keratosis: Secondary | ICD-10-CM | POA: Diagnosis not present

## 2015-04-10 DIAGNOSIS — H01001 Unspecified blepharitis right upper eyelid: Secondary | ICD-10-CM | POA: Diagnosis not present

## 2015-04-10 DIAGNOSIS — H01004 Unspecified blepharitis left upper eyelid: Secondary | ICD-10-CM | POA: Diagnosis not present

## 2015-05-07 DIAGNOSIS — D225 Melanocytic nevi of trunk: Secondary | ICD-10-CM | POA: Diagnosis not present

## 2015-05-07 DIAGNOSIS — L821 Other seborrheic keratosis: Secondary | ICD-10-CM | POA: Diagnosis not present

## 2015-05-07 DIAGNOSIS — L820 Inflamed seborrheic keratosis: Secondary | ICD-10-CM | POA: Diagnosis not present

## 2015-05-16 DIAGNOSIS — Z6828 Body mass index (BMI) 28.0-28.9, adult: Secondary | ICD-10-CM | POA: Diagnosis not present

## 2015-05-16 DIAGNOSIS — E1149 Type 2 diabetes mellitus with other diabetic neurological complication: Secondary | ICD-10-CM | POA: Diagnosis not present

## 2015-05-16 DIAGNOSIS — E785 Hyperlipidemia, unspecified: Secondary | ICD-10-CM | POA: Diagnosis not present

## 2015-05-16 DIAGNOSIS — I10 Essential (primary) hypertension: Secondary | ICD-10-CM | POA: Diagnosis not present

## 2015-05-16 DIAGNOSIS — B91 Sequelae of poliomyelitis: Secondary | ICD-10-CM | POA: Diagnosis not present

## 2015-05-16 DIAGNOSIS — I700 Atherosclerosis of aorta: Secondary | ICD-10-CM | POA: Diagnosis not present

## 2015-05-16 DIAGNOSIS — E1142 Type 2 diabetes mellitus with diabetic polyneuropathy: Secondary | ICD-10-CM | POA: Diagnosis not present

## 2015-08-21 DIAGNOSIS — Z6828 Body mass index (BMI) 28.0-28.9, adult: Secondary | ICD-10-CM | POA: Diagnosis not present

## 2015-08-21 DIAGNOSIS — J180 Bronchopneumonia, unspecified organism: Secondary | ICD-10-CM | POA: Diagnosis not present

## 2015-08-28 DIAGNOSIS — Z6829 Body mass index (BMI) 29.0-29.9, adult: Secondary | ICD-10-CM | POA: Diagnosis not present

## 2015-08-28 DIAGNOSIS — J019 Acute sinusitis, unspecified: Secondary | ICD-10-CM | POA: Diagnosis not present

## 2015-10-02 DIAGNOSIS — E1149 Type 2 diabetes mellitus with other diabetic neurological complication: Secondary | ICD-10-CM | POA: Diagnosis not present

## 2015-10-02 DIAGNOSIS — I10 Essential (primary) hypertension: Secondary | ICD-10-CM | POA: Diagnosis not present

## 2015-10-02 DIAGNOSIS — I739 Peripheral vascular disease, unspecified: Secondary | ICD-10-CM | POA: Diagnosis not present

## 2015-10-02 DIAGNOSIS — E785 Hyperlipidemia, unspecified: Secondary | ICD-10-CM | POA: Diagnosis not present

## 2015-10-02 DIAGNOSIS — E1151 Type 2 diabetes mellitus with diabetic peripheral angiopathy without gangrene: Secondary | ICD-10-CM | POA: Diagnosis not present

## 2015-10-02 DIAGNOSIS — Z6827 Body mass index (BMI) 27.0-27.9, adult: Secondary | ICD-10-CM | POA: Diagnosis not present

## 2015-10-02 DIAGNOSIS — I700 Atherosclerosis of aorta: Secondary | ICD-10-CM | POA: Diagnosis not present

## 2015-10-02 DIAGNOSIS — E1142 Type 2 diabetes mellitus with diabetic polyneuropathy: Secondary | ICD-10-CM | POA: Diagnosis not present

## 2015-11-19 DIAGNOSIS — J019 Acute sinusitis, unspecified: Secondary | ICD-10-CM | POA: Diagnosis not present

## 2015-11-19 DIAGNOSIS — J180 Bronchopneumonia, unspecified organism: Secondary | ICD-10-CM | POA: Diagnosis not present

## 2015-12-03 DIAGNOSIS — S51801A Unspecified open wound of right forearm, initial encounter: Secondary | ICD-10-CM | POA: Diagnosis not present

## 2015-12-31 DIAGNOSIS — J180 Bronchopneumonia, unspecified organism: Secondary | ICD-10-CM | POA: Diagnosis not present

## 2015-12-31 DIAGNOSIS — Z23 Encounter for immunization: Secondary | ICD-10-CM | POA: Diagnosis not present

## 2016-01-19 DIAGNOSIS — N390 Urinary tract infection, site not specified: Secondary | ICD-10-CM | POA: Diagnosis not present

## 2016-02-18 DIAGNOSIS — E782 Mixed hyperlipidemia: Secondary | ICD-10-CM | POA: Diagnosis not present

## 2016-02-18 DIAGNOSIS — N390 Urinary tract infection, site not specified: Secondary | ICD-10-CM | POA: Diagnosis not present

## 2016-02-18 DIAGNOSIS — E119 Type 2 diabetes mellitus without complications: Secondary | ICD-10-CM | POA: Diagnosis not present

## 2016-02-18 DIAGNOSIS — J449 Chronic obstructive pulmonary disease, unspecified: Secondary | ICD-10-CM | POA: Diagnosis not present

## 2016-02-18 DIAGNOSIS — I10 Essential (primary) hypertension: Secondary | ICD-10-CM | POA: Diagnosis not present

## 2016-03-12 DIAGNOSIS — E782 Mixed hyperlipidemia: Secondary | ICD-10-CM | POA: Diagnosis not present

## 2016-03-12 DIAGNOSIS — E1149 Type 2 diabetes mellitus with other diabetic neurological complication: Secondary | ICD-10-CM | POA: Diagnosis not present

## 2016-03-12 DIAGNOSIS — E1129 Type 2 diabetes mellitus with other diabetic kidney complication: Secondary | ICD-10-CM | POA: Diagnosis not present

## 2016-03-12 DIAGNOSIS — N183 Chronic kidney disease, stage 3 (moderate): Secondary | ICD-10-CM | POA: Diagnosis not present

## 2016-03-12 DIAGNOSIS — E1142 Type 2 diabetes mellitus with diabetic polyneuropathy: Secondary | ICD-10-CM | POA: Diagnosis not present

## 2016-03-12 DIAGNOSIS — I10 Essential (primary) hypertension: Secondary | ICD-10-CM | POA: Diagnosis not present

## 2016-03-24 DIAGNOSIS — N390 Urinary tract infection, site not specified: Secondary | ICD-10-CM | POA: Diagnosis not present

## 2016-03-24 DIAGNOSIS — L308 Other specified dermatitis: Secondary | ICD-10-CM | POA: Diagnosis not present

## 2016-03-24 DIAGNOSIS — N3000 Acute cystitis without hematuria: Secondary | ICD-10-CM | POA: Diagnosis not present

## 2016-03-29 DIAGNOSIS — I10 Essential (primary) hypertension: Secondary | ICD-10-CM | POA: Diagnosis not present

## 2016-03-29 DIAGNOSIS — R509 Fever, unspecified: Secondary | ICD-10-CM | POA: Diagnosis not present

## 2016-03-29 DIAGNOSIS — K529 Noninfective gastroenteritis and colitis, unspecified: Secondary | ICD-10-CM | POA: Diagnosis not present

## 2016-03-29 DIAGNOSIS — R197 Diarrhea, unspecified: Secondary | ICD-10-CM

## 2016-03-29 DIAGNOSIS — R4781 Slurred speech: Secondary | ICD-10-CM | POA: Diagnosis not present

## 2016-03-29 DIAGNOSIS — R1030 Lower abdominal pain, unspecified: Secondary | ICD-10-CM | POA: Diagnosis not present

## 2016-03-29 DIAGNOSIS — E860 Dehydration: Secondary | ICD-10-CM

## 2016-03-29 DIAGNOSIS — G9341 Metabolic encephalopathy: Secondary | ICD-10-CM | POA: Diagnosis not present

## 2016-03-29 DIAGNOSIS — E119 Type 2 diabetes mellitus without complications: Secondary | ICD-10-CM | POA: Diagnosis not present

## 2016-03-29 DIAGNOSIS — E785 Hyperlipidemia, unspecified: Secondary | ICD-10-CM | POA: Diagnosis not present

## 2016-03-29 DIAGNOSIS — N390 Urinary tract infection, site not specified: Secondary | ICD-10-CM

## 2016-03-30 DIAGNOSIS — Z87440 Personal history of urinary (tract) infections: Secondary | ICD-10-CM | POA: Diagnosis not present

## 2016-03-30 DIAGNOSIS — I361 Nonrheumatic tricuspid (valve) insufficiency: Secondary | ICD-10-CM | POA: Diagnosis not present

## 2016-03-30 DIAGNOSIS — A084 Viral intestinal infection, unspecified: Secondary | ICD-10-CM | POA: Diagnosis present

## 2016-03-30 DIAGNOSIS — Z885 Allergy status to narcotic agent status: Secondary | ICD-10-CM | POA: Diagnosis not present

## 2016-03-30 DIAGNOSIS — I639 Cerebral infarction, unspecified: Secondary | ICD-10-CM | POA: Diagnosis not present

## 2016-03-30 DIAGNOSIS — R4781 Slurred speech: Secondary | ICD-10-CM | POA: Diagnosis not present

## 2016-03-30 DIAGNOSIS — E860 Dehydration: Secondary | ICD-10-CM

## 2016-03-30 DIAGNOSIS — J449 Chronic obstructive pulmonary disease, unspecified: Secondary | ICD-10-CM | POA: Diagnosis present

## 2016-03-30 DIAGNOSIS — F329 Major depressive disorder, single episode, unspecified: Secondary | ICD-10-CM | POA: Diagnosis present

## 2016-03-30 DIAGNOSIS — Z882 Allergy status to sulfonamides status: Secondary | ICD-10-CM | POA: Diagnosis not present

## 2016-03-30 DIAGNOSIS — I10 Essential (primary) hypertension: Secondary | ICD-10-CM | POA: Diagnosis not present

## 2016-03-30 DIAGNOSIS — R4182 Altered mental status, unspecified: Secondary | ICD-10-CM | POA: Diagnosis not present

## 2016-03-30 DIAGNOSIS — E119 Type 2 diabetes mellitus without complications: Secondary | ICD-10-CM | POA: Diagnosis not present

## 2016-03-30 DIAGNOSIS — Z881 Allergy status to other antibiotic agents status: Secondary | ICD-10-CM | POA: Diagnosis not present

## 2016-03-30 DIAGNOSIS — I63233 Cerebral infarction due to unspecified occlusion or stenosis of bilateral carotid arteries: Secondary | ICD-10-CM | POA: Diagnosis not present

## 2016-03-30 DIAGNOSIS — N390 Urinary tract infection, site not specified: Secondary | ICD-10-CM | POA: Diagnosis not present

## 2016-03-30 DIAGNOSIS — Z79899 Other long term (current) drug therapy: Secondary | ICD-10-CM | POA: Diagnosis not present

## 2016-03-30 DIAGNOSIS — Z87891 Personal history of nicotine dependence: Secondary | ICD-10-CM | POA: Diagnosis not present

## 2016-03-30 DIAGNOSIS — N3000 Acute cystitis without hematuria: Secondary | ICD-10-CM | POA: Diagnosis not present

## 2016-03-30 DIAGNOSIS — R1030 Lower abdominal pain, unspecified: Secondary | ICD-10-CM | POA: Diagnosis not present

## 2016-03-30 DIAGNOSIS — G9341 Metabolic encephalopathy: Secondary | ICD-10-CM | POA: Diagnosis not present

## 2016-03-30 DIAGNOSIS — M1990 Unspecified osteoarthritis, unspecified site: Secondary | ICD-10-CM | POA: Diagnosis present

## 2016-03-30 DIAGNOSIS — Z8542 Personal history of malignant neoplasm of other parts of uterus: Secondary | ICD-10-CM | POA: Diagnosis not present

## 2016-03-30 DIAGNOSIS — R197 Diarrhea, unspecified: Secondary | ICD-10-CM

## 2016-03-30 DIAGNOSIS — R509 Fever, unspecified: Secondary | ICD-10-CM | POA: Diagnosis not present

## 2016-03-30 DIAGNOSIS — I350 Nonrheumatic aortic (valve) stenosis: Secondary | ICD-10-CM | POA: Diagnosis not present

## 2016-03-30 DIAGNOSIS — K219 Gastro-esophageal reflux disease without esophagitis: Secondary | ICD-10-CM | POA: Diagnosis present

## 2016-03-30 DIAGNOSIS — R29704 NIHSS score 4: Secondary | ICD-10-CM | POA: Diagnosis present

## 2016-03-30 DIAGNOSIS — K529 Noninfective gastroenteritis and colitis, unspecified: Secondary | ICD-10-CM | POA: Diagnosis not present

## 2016-03-30 DIAGNOSIS — E785 Hyperlipidemia, unspecified: Secondary | ICD-10-CM | POA: Diagnosis not present

## 2016-03-30 DIAGNOSIS — K6389 Other specified diseases of intestine: Secondary | ICD-10-CM | POA: Diagnosis not present

## 2016-03-31 DIAGNOSIS — I639 Cerebral infarction, unspecified: Secondary | ICD-10-CM

## 2016-03-31 DIAGNOSIS — N390 Urinary tract infection, site not specified: Secondary | ICD-10-CM

## 2016-03-31 DIAGNOSIS — G9341 Metabolic encephalopathy: Secondary | ICD-10-CM

## 2016-04-03 DIAGNOSIS — I69320 Aphasia following cerebral infarction: Secondary | ICD-10-CM | POA: Diagnosis not present

## 2016-04-03 DIAGNOSIS — E119 Type 2 diabetes mellitus without complications: Secondary | ICD-10-CM | POA: Diagnosis not present

## 2016-04-03 DIAGNOSIS — F329 Major depressive disorder, single episode, unspecified: Secondary | ICD-10-CM | POA: Diagnosis not present

## 2016-04-03 DIAGNOSIS — I69314 Frontal lobe and executive function deficit following cerebral infarction: Secondary | ICD-10-CM | POA: Diagnosis not present

## 2016-04-03 DIAGNOSIS — Z8542 Personal history of malignant neoplasm of other parts of uterus: Secondary | ICD-10-CM | POA: Diagnosis not present

## 2016-04-03 DIAGNOSIS — Z87440 Personal history of urinary (tract) infections: Secondary | ICD-10-CM | POA: Diagnosis not present

## 2016-04-03 DIAGNOSIS — I10 Essential (primary) hypertension: Secondary | ICD-10-CM | POA: Diagnosis not present

## 2016-04-03 DIAGNOSIS — M1990 Unspecified osteoarthritis, unspecified site: Secondary | ICD-10-CM | POA: Diagnosis not present

## 2016-04-03 DIAGNOSIS — Z794 Long term (current) use of insulin: Secondary | ICD-10-CM | POA: Diagnosis not present

## 2016-04-03 DIAGNOSIS — Z87891 Personal history of nicotine dependence: Secondary | ICD-10-CM | POA: Diagnosis not present

## 2016-04-07 DIAGNOSIS — I10 Essential (primary) hypertension: Secondary | ICD-10-CM | POA: Diagnosis not present

## 2016-04-07 DIAGNOSIS — I69320 Aphasia following cerebral infarction: Secondary | ICD-10-CM | POA: Diagnosis not present

## 2016-04-07 DIAGNOSIS — I69314 Frontal lobe and executive function deficit following cerebral infarction: Secondary | ICD-10-CM | POA: Diagnosis not present

## 2016-04-07 DIAGNOSIS — E119 Type 2 diabetes mellitus without complications: Secondary | ICD-10-CM | POA: Diagnosis not present

## 2016-04-07 DIAGNOSIS — F329 Major depressive disorder, single episode, unspecified: Secondary | ICD-10-CM | POA: Diagnosis not present

## 2016-04-07 DIAGNOSIS — M1990 Unspecified osteoarthritis, unspecified site: Secondary | ICD-10-CM | POA: Diagnosis not present

## 2016-04-09 DIAGNOSIS — F329 Major depressive disorder, single episode, unspecified: Secondary | ICD-10-CM | POA: Diagnosis not present

## 2016-04-09 DIAGNOSIS — E782 Mixed hyperlipidemia: Secondary | ICD-10-CM | POA: Diagnosis not present

## 2016-04-09 DIAGNOSIS — I69320 Aphasia following cerebral infarction: Secondary | ICD-10-CM | POA: Diagnosis not present

## 2016-04-09 DIAGNOSIS — E119 Type 2 diabetes mellitus without complications: Secondary | ICD-10-CM | POA: Diagnosis not present

## 2016-04-09 DIAGNOSIS — I10 Essential (primary) hypertension: Secondary | ICD-10-CM | POA: Diagnosis not present

## 2016-04-09 DIAGNOSIS — M1990 Unspecified osteoarthritis, unspecified site: Secondary | ICD-10-CM | POA: Diagnosis not present

## 2016-04-09 DIAGNOSIS — I69314 Frontal lobe and executive function deficit following cerebral infarction: Secondary | ICD-10-CM | POA: Diagnosis not present

## 2016-04-09 DIAGNOSIS — I6350 Cerebral infarction due to unspecified occlusion or stenosis of unspecified cerebral artery: Secondary | ICD-10-CM | POA: Diagnosis not present

## 2016-04-12 DIAGNOSIS — M1990 Unspecified osteoarthritis, unspecified site: Secondary | ICD-10-CM | POA: Diagnosis not present

## 2016-04-12 DIAGNOSIS — I69320 Aphasia following cerebral infarction: Secondary | ICD-10-CM | POA: Diagnosis not present

## 2016-04-12 DIAGNOSIS — F329 Major depressive disorder, single episode, unspecified: Secondary | ICD-10-CM | POA: Diagnosis not present

## 2016-04-12 DIAGNOSIS — I10 Essential (primary) hypertension: Secondary | ICD-10-CM | POA: Diagnosis not present

## 2016-04-12 DIAGNOSIS — I69314 Frontal lobe and executive function deficit following cerebral infarction: Secondary | ICD-10-CM | POA: Diagnosis not present

## 2016-04-12 DIAGNOSIS — E119 Type 2 diabetes mellitus without complications: Secondary | ICD-10-CM | POA: Diagnosis not present

## 2016-04-13 DIAGNOSIS — E119 Type 2 diabetes mellitus without complications: Secondary | ICD-10-CM | POA: Diagnosis not present

## 2016-04-13 DIAGNOSIS — I10 Essential (primary) hypertension: Secondary | ICD-10-CM | POA: Diagnosis not present

## 2016-04-13 DIAGNOSIS — F329 Major depressive disorder, single episode, unspecified: Secondary | ICD-10-CM | POA: Diagnosis not present

## 2016-04-13 DIAGNOSIS — M1990 Unspecified osteoarthritis, unspecified site: Secondary | ICD-10-CM | POA: Diagnosis not present

## 2016-04-13 DIAGNOSIS — I69320 Aphasia following cerebral infarction: Secondary | ICD-10-CM | POA: Diagnosis not present

## 2016-04-13 DIAGNOSIS — I69314 Frontal lobe and executive function deficit following cerebral infarction: Secondary | ICD-10-CM | POA: Diagnosis not present

## 2016-04-14 DIAGNOSIS — E119 Type 2 diabetes mellitus without complications: Secondary | ICD-10-CM | POA: Diagnosis not present

## 2016-04-14 DIAGNOSIS — I69320 Aphasia following cerebral infarction: Secondary | ICD-10-CM | POA: Diagnosis not present

## 2016-04-14 DIAGNOSIS — M1990 Unspecified osteoarthritis, unspecified site: Secondary | ICD-10-CM | POA: Diagnosis not present

## 2016-04-14 DIAGNOSIS — I69314 Frontal lobe and executive function deficit following cerebral infarction: Secondary | ICD-10-CM | POA: Diagnosis not present

## 2016-04-14 DIAGNOSIS — I10 Essential (primary) hypertension: Secondary | ICD-10-CM | POA: Diagnosis not present

## 2016-04-14 DIAGNOSIS — F329 Major depressive disorder, single episode, unspecified: Secondary | ICD-10-CM | POA: Diagnosis not present

## 2016-04-19 DIAGNOSIS — I69320 Aphasia following cerebral infarction: Secondary | ICD-10-CM | POA: Diagnosis not present

## 2016-04-19 DIAGNOSIS — I10 Essential (primary) hypertension: Secondary | ICD-10-CM | POA: Diagnosis not present

## 2016-04-19 DIAGNOSIS — M1990 Unspecified osteoarthritis, unspecified site: Secondary | ICD-10-CM | POA: Diagnosis not present

## 2016-04-19 DIAGNOSIS — I69314 Frontal lobe and executive function deficit following cerebral infarction: Secondary | ICD-10-CM | POA: Diagnosis not present

## 2016-04-19 DIAGNOSIS — E119 Type 2 diabetes mellitus without complications: Secondary | ICD-10-CM | POA: Diagnosis not present

## 2016-04-19 DIAGNOSIS — F329 Major depressive disorder, single episode, unspecified: Secondary | ICD-10-CM | POA: Diagnosis not present

## 2016-04-20 DIAGNOSIS — F329 Major depressive disorder, single episode, unspecified: Secondary | ICD-10-CM | POA: Diagnosis not present

## 2016-04-20 DIAGNOSIS — I10 Essential (primary) hypertension: Secondary | ICD-10-CM | POA: Diagnosis not present

## 2016-04-20 DIAGNOSIS — I69320 Aphasia following cerebral infarction: Secondary | ICD-10-CM | POA: Diagnosis not present

## 2016-04-20 DIAGNOSIS — M1990 Unspecified osteoarthritis, unspecified site: Secondary | ICD-10-CM | POA: Diagnosis not present

## 2016-04-20 DIAGNOSIS — E119 Type 2 diabetes mellitus without complications: Secondary | ICD-10-CM | POA: Diagnosis not present

## 2016-04-20 DIAGNOSIS — I69314 Frontal lobe and executive function deficit following cerebral infarction: Secondary | ICD-10-CM | POA: Diagnosis not present

## 2016-04-21 DIAGNOSIS — E119 Type 2 diabetes mellitus without complications: Secondary | ICD-10-CM | POA: Diagnosis not present

## 2016-04-21 DIAGNOSIS — F329 Major depressive disorder, single episode, unspecified: Secondary | ICD-10-CM | POA: Diagnosis not present

## 2016-04-21 DIAGNOSIS — I69314 Frontal lobe and executive function deficit following cerebral infarction: Secondary | ICD-10-CM | POA: Diagnosis not present

## 2016-04-21 DIAGNOSIS — I69320 Aphasia following cerebral infarction: Secondary | ICD-10-CM | POA: Diagnosis not present

## 2016-04-21 DIAGNOSIS — M1990 Unspecified osteoarthritis, unspecified site: Secondary | ICD-10-CM | POA: Diagnosis not present

## 2016-04-21 DIAGNOSIS — I10 Essential (primary) hypertension: Secondary | ICD-10-CM | POA: Diagnosis not present

## 2016-04-22 DIAGNOSIS — I6300 Cerebral infarction due to thrombosis of unspecified precerebral artery: Secondary | ICD-10-CM | POA: Diagnosis not present

## 2016-04-26 DIAGNOSIS — I69320 Aphasia following cerebral infarction: Secondary | ICD-10-CM | POA: Diagnosis not present

## 2016-04-26 DIAGNOSIS — I69314 Frontal lobe and executive function deficit following cerebral infarction: Secondary | ICD-10-CM | POA: Diagnosis not present

## 2016-04-26 DIAGNOSIS — E119 Type 2 diabetes mellitus without complications: Secondary | ICD-10-CM | POA: Diagnosis not present

## 2016-04-27 DIAGNOSIS — F329 Major depressive disorder, single episode, unspecified: Secondary | ICD-10-CM | POA: Diagnosis not present

## 2016-04-27 DIAGNOSIS — I69314 Frontal lobe and executive function deficit following cerebral infarction: Secondary | ICD-10-CM | POA: Diagnosis not present

## 2016-04-27 DIAGNOSIS — I10 Essential (primary) hypertension: Secondary | ICD-10-CM | POA: Diagnosis not present

## 2016-04-27 DIAGNOSIS — M1990 Unspecified osteoarthritis, unspecified site: Secondary | ICD-10-CM | POA: Diagnosis not present

## 2016-04-27 DIAGNOSIS — E119 Type 2 diabetes mellitus without complications: Secondary | ICD-10-CM | POA: Diagnosis not present

## 2016-04-27 DIAGNOSIS — I69320 Aphasia following cerebral infarction: Secondary | ICD-10-CM | POA: Diagnosis not present

## 2016-04-28 DIAGNOSIS — E119 Type 2 diabetes mellitus without complications: Secondary | ICD-10-CM | POA: Diagnosis not present

## 2016-04-28 DIAGNOSIS — I69314 Frontal lobe and executive function deficit following cerebral infarction: Secondary | ICD-10-CM | POA: Diagnosis not present

## 2016-04-28 DIAGNOSIS — F329 Major depressive disorder, single episode, unspecified: Secondary | ICD-10-CM | POA: Diagnosis not present

## 2016-04-28 DIAGNOSIS — M1990 Unspecified osteoarthritis, unspecified site: Secondary | ICD-10-CM | POA: Diagnosis not present

## 2016-04-28 DIAGNOSIS — I10 Essential (primary) hypertension: Secondary | ICD-10-CM | POA: Diagnosis not present

## 2016-04-28 DIAGNOSIS — I69320 Aphasia following cerebral infarction: Secondary | ICD-10-CM | POA: Diagnosis not present

## 2016-05-03 DIAGNOSIS — I10 Essential (primary) hypertension: Secondary | ICD-10-CM | POA: Diagnosis not present

## 2016-05-03 DIAGNOSIS — I69320 Aphasia following cerebral infarction: Secondary | ICD-10-CM | POA: Diagnosis not present

## 2016-05-03 DIAGNOSIS — F329 Major depressive disorder, single episode, unspecified: Secondary | ICD-10-CM | POA: Diagnosis not present

## 2016-05-03 DIAGNOSIS — I69314 Frontal lobe and executive function deficit following cerebral infarction: Secondary | ICD-10-CM | POA: Diagnosis not present

## 2016-05-03 DIAGNOSIS — M1990 Unspecified osteoarthritis, unspecified site: Secondary | ICD-10-CM | POA: Diagnosis not present

## 2016-05-03 DIAGNOSIS — E119 Type 2 diabetes mellitus without complications: Secondary | ICD-10-CM | POA: Diagnosis not present

## 2016-05-05 DIAGNOSIS — I69314 Frontal lobe and executive function deficit following cerebral infarction: Secondary | ICD-10-CM | POA: Diagnosis not present

## 2016-05-05 DIAGNOSIS — M1990 Unspecified osteoarthritis, unspecified site: Secondary | ICD-10-CM | POA: Diagnosis not present

## 2016-05-05 DIAGNOSIS — F329 Major depressive disorder, single episode, unspecified: Secondary | ICD-10-CM | POA: Diagnosis not present

## 2016-05-05 DIAGNOSIS — I10 Essential (primary) hypertension: Secondary | ICD-10-CM | POA: Diagnosis not present

## 2016-05-05 DIAGNOSIS — I69320 Aphasia following cerebral infarction: Secondary | ICD-10-CM | POA: Diagnosis not present

## 2016-05-05 DIAGNOSIS — E119 Type 2 diabetes mellitus without complications: Secondary | ICD-10-CM | POA: Diagnosis not present

## 2016-05-19 DIAGNOSIS — I69320 Aphasia following cerebral infarction: Secondary | ICD-10-CM | POA: Diagnosis not present

## 2016-05-19 DIAGNOSIS — I10 Essential (primary) hypertension: Secondary | ICD-10-CM | POA: Diagnosis not present

## 2016-05-19 DIAGNOSIS — M1990 Unspecified osteoarthritis, unspecified site: Secondary | ICD-10-CM | POA: Diagnosis not present

## 2016-05-19 DIAGNOSIS — F329 Major depressive disorder, single episode, unspecified: Secondary | ICD-10-CM | POA: Diagnosis not present

## 2016-05-19 DIAGNOSIS — I69314 Frontal lobe and executive function deficit following cerebral infarction: Secondary | ICD-10-CM | POA: Diagnosis not present

## 2016-05-19 DIAGNOSIS — E119 Type 2 diabetes mellitus without complications: Secondary | ICD-10-CM | POA: Diagnosis not present

## 2016-05-21 DIAGNOSIS — I69314 Frontal lobe and executive function deficit following cerebral infarction: Secondary | ICD-10-CM | POA: Diagnosis not present

## 2016-05-21 DIAGNOSIS — E119 Type 2 diabetes mellitus without complications: Secondary | ICD-10-CM | POA: Diagnosis not present

## 2016-05-21 DIAGNOSIS — I10 Essential (primary) hypertension: Secondary | ICD-10-CM | POA: Diagnosis not present

## 2016-05-21 DIAGNOSIS — F329 Major depressive disorder, single episode, unspecified: Secondary | ICD-10-CM | POA: Diagnosis not present

## 2016-05-21 DIAGNOSIS — I69320 Aphasia following cerebral infarction: Secondary | ICD-10-CM | POA: Diagnosis not present

## 2016-05-21 DIAGNOSIS — M1990 Unspecified osteoarthritis, unspecified site: Secondary | ICD-10-CM | POA: Diagnosis not present

## 2016-05-24 DIAGNOSIS — I10 Essential (primary) hypertension: Secondary | ICD-10-CM | POA: Diagnosis not present

## 2016-05-24 DIAGNOSIS — E119 Type 2 diabetes mellitus without complications: Secondary | ICD-10-CM | POA: Diagnosis not present

## 2016-05-24 DIAGNOSIS — M1990 Unspecified osteoarthritis, unspecified site: Secondary | ICD-10-CM | POA: Diagnosis not present

## 2016-05-24 DIAGNOSIS — F329 Major depressive disorder, single episode, unspecified: Secondary | ICD-10-CM | POA: Diagnosis not present

## 2016-05-24 DIAGNOSIS — I69320 Aphasia following cerebral infarction: Secondary | ICD-10-CM | POA: Diagnosis not present

## 2016-05-24 DIAGNOSIS — I69314 Frontal lobe and executive function deficit following cerebral infarction: Secondary | ICD-10-CM | POA: Diagnosis not present

## 2016-05-28 DIAGNOSIS — I69314 Frontal lobe and executive function deficit following cerebral infarction: Secondary | ICD-10-CM | POA: Diagnosis not present

## 2016-05-28 DIAGNOSIS — M1990 Unspecified osteoarthritis, unspecified site: Secondary | ICD-10-CM | POA: Diagnosis not present

## 2016-05-28 DIAGNOSIS — F329 Major depressive disorder, single episode, unspecified: Secondary | ICD-10-CM | POA: Diagnosis not present

## 2016-05-28 DIAGNOSIS — E119 Type 2 diabetes mellitus without complications: Secondary | ICD-10-CM | POA: Diagnosis not present

## 2016-05-28 DIAGNOSIS — I69320 Aphasia following cerebral infarction: Secondary | ICD-10-CM | POA: Diagnosis not present

## 2016-05-28 DIAGNOSIS — I10 Essential (primary) hypertension: Secondary | ICD-10-CM | POA: Diagnosis not present

## 2016-05-31 DIAGNOSIS — F329 Major depressive disorder, single episode, unspecified: Secondary | ICD-10-CM | POA: Diagnosis not present

## 2016-05-31 DIAGNOSIS — E119 Type 2 diabetes mellitus without complications: Secondary | ICD-10-CM | POA: Diagnosis not present

## 2016-05-31 DIAGNOSIS — I69320 Aphasia following cerebral infarction: Secondary | ICD-10-CM | POA: Diagnosis not present

## 2016-05-31 DIAGNOSIS — M1990 Unspecified osteoarthritis, unspecified site: Secondary | ICD-10-CM | POA: Diagnosis not present

## 2016-05-31 DIAGNOSIS — I10 Essential (primary) hypertension: Secondary | ICD-10-CM | POA: Diagnosis not present

## 2016-05-31 DIAGNOSIS — I69314 Frontal lobe and executive function deficit following cerebral infarction: Secondary | ICD-10-CM | POA: Diagnosis not present

## 2016-06-01 DIAGNOSIS — I69320 Aphasia following cerebral infarction: Secondary | ICD-10-CM | POA: Diagnosis not present

## 2016-06-01 DIAGNOSIS — I10 Essential (primary) hypertension: Secondary | ICD-10-CM | POA: Diagnosis not present

## 2016-06-01 DIAGNOSIS — M1990 Unspecified osteoarthritis, unspecified site: Secondary | ICD-10-CM | POA: Diagnosis not present

## 2016-06-01 DIAGNOSIS — F329 Major depressive disorder, single episode, unspecified: Secondary | ICD-10-CM | POA: Diagnosis not present

## 2016-06-01 DIAGNOSIS — I69314 Frontal lobe and executive function deficit following cerebral infarction: Secondary | ICD-10-CM | POA: Diagnosis not present

## 2016-06-01 DIAGNOSIS — E119 Type 2 diabetes mellitus without complications: Secondary | ICD-10-CM | POA: Diagnosis not present

## 2016-06-02 DIAGNOSIS — Z87440 Personal history of urinary (tract) infections: Secondary | ICD-10-CM | POA: Diagnosis not present

## 2016-06-02 DIAGNOSIS — Z87891 Personal history of nicotine dependence: Secondary | ICD-10-CM | POA: Diagnosis not present

## 2016-06-02 DIAGNOSIS — Z8542 Personal history of malignant neoplasm of other parts of uterus: Secondary | ICD-10-CM | POA: Diagnosis not present

## 2016-06-02 DIAGNOSIS — M1990 Unspecified osteoarthritis, unspecified site: Secondary | ICD-10-CM | POA: Diagnosis not present

## 2016-06-02 DIAGNOSIS — E119 Type 2 diabetes mellitus without complications: Secondary | ICD-10-CM | POA: Diagnosis not present

## 2016-06-02 DIAGNOSIS — F329 Major depressive disorder, single episode, unspecified: Secondary | ICD-10-CM | POA: Diagnosis not present

## 2016-06-02 DIAGNOSIS — I69320 Aphasia following cerebral infarction: Secondary | ICD-10-CM | POA: Diagnosis not present

## 2016-06-02 DIAGNOSIS — I10 Essential (primary) hypertension: Secondary | ICD-10-CM | POA: Diagnosis not present

## 2016-06-02 DIAGNOSIS — Z794 Long term (current) use of insulin: Secondary | ICD-10-CM | POA: Diagnosis not present

## 2016-06-02 DIAGNOSIS — I69314 Frontal lobe and executive function deficit following cerebral infarction: Secondary | ICD-10-CM | POA: Diagnosis not present

## 2016-06-07 DIAGNOSIS — F329 Major depressive disorder, single episode, unspecified: Secondary | ICD-10-CM | POA: Diagnosis not present

## 2016-06-07 DIAGNOSIS — E119 Type 2 diabetes mellitus without complications: Secondary | ICD-10-CM | POA: Diagnosis not present

## 2016-06-07 DIAGNOSIS — M1990 Unspecified osteoarthritis, unspecified site: Secondary | ICD-10-CM | POA: Diagnosis not present

## 2016-06-07 DIAGNOSIS — I10 Essential (primary) hypertension: Secondary | ICD-10-CM | POA: Diagnosis not present

## 2016-06-07 DIAGNOSIS — I69314 Frontal lobe and executive function deficit following cerebral infarction: Secondary | ICD-10-CM | POA: Diagnosis not present

## 2016-06-07 DIAGNOSIS — I69320 Aphasia following cerebral infarction: Secondary | ICD-10-CM | POA: Diagnosis not present

## 2016-06-10 DIAGNOSIS — M1990 Unspecified osteoarthritis, unspecified site: Secondary | ICD-10-CM | POA: Diagnosis not present

## 2016-06-10 DIAGNOSIS — I69320 Aphasia following cerebral infarction: Secondary | ICD-10-CM | POA: Diagnosis not present

## 2016-06-10 DIAGNOSIS — I69314 Frontal lobe and executive function deficit following cerebral infarction: Secondary | ICD-10-CM | POA: Diagnosis not present

## 2016-06-10 DIAGNOSIS — F329 Major depressive disorder, single episode, unspecified: Secondary | ICD-10-CM | POA: Diagnosis not present

## 2016-06-10 DIAGNOSIS — I10 Essential (primary) hypertension: Secondary | ICD-10-CM | POA: Diagnosis not present

## 2016-06-10 DIAGNOSIS — E119 Type 2 diabetes mellitus without complications: Secondary | ICD-10-CM | POA: Diagnosis not present

## 2016-06-11 DIAGNOSIS — Z23 Encounter for immunization: Secondary | ICD-10-CM | POA: Diagnosis not present

## 2016-06-25 DIAGNOSIS — I63311 Cerebral infarction due to thrombosis of right middle cerebral artery: Secondary | ICD-10-CM | POA: Diagnosis not present

## 2016-06-25 DIAGNOSIS — B91 Sequelae of poliomyelitis: Secondary | ICD-10-CM | POA: Diagnosis not present

## 2016-06-25 DIAGNOSIS — E782 Mixed hyperlipidemia: Secondary | ICD-10-CM | POA: Diagnosis not present

## 2016-06-25 DIAGNOSIS — E119 Type 2 diabetes mellitus without complications: Secondary | ICD-10-CM | POA: Diagnosis not present

## 2016-06-25 DIAGNOSIS — K219 Gastro-esophageal reflux disease without esophagitis: Secondary | ICD-10-CM | POA: Diagnosis not present

## 2016-06-25 DIAGNOSIS — I10 Essential (primary) hypertension: Secondary | ICD-10-CM | POA: Diagnosis not present

## 2016-06-25 DIAGNOSIS — J449 Chronic obstructive pulmonary disease, unspecified: Secondary | ICD-10-CM | POA: Diagnosis not present

## 2016-10-13 DIAGNOSIS — R197 Diarrhea, unspecified: Secondary | ICD-10-CM | POA: Diagnosis not present

## 2016-10-19 DIAGNOSIS — R197 Diarrhea, unspecified: Secondary | ICD-10-CM | POA: Diagnosis not present

## 2016-10-26 DIAGNOSIS — B91 Sequelae of poliomyelitis: Secondary | ICD-10-CM | POA: Diagnosis not present

## 2016-10-26 DIAGNOSIS — I10 Essential (primary) hypertension: Secondary | ICD-10-CM | POA: Diagnosis not present

## 2016-10-26 DIAGNOSIS — E782 Mixed hyperlipidemia: Secondary | ICD-10-CM | POA: Diagnosis not present

## 2016-10-26 DIAGNOSIS — E119 Type 2 diabetes mellitus without complications: Secondary | ICD-10-CM | POA: Diagnosis not present

## 2016-10-26 DIAGNOSIS — K219 Gastro-esophageal reflux disease without esophagitis: Secondary | ICD-10-CM | POA: Diagnosis not present

## 2016-10-26 DIAGNOSIS — I69321 Dysphasia following cerebral infarction: Secondary | ICD-10-CM | POA: Diagnosis not present

## 2016-10-26 DIAGNOSIS — J449 Chronic obstructive pulmonary disease, unspecified: Secondary | ICD-10-CM | POA: Diagnosis not present

## 2016-11-30 DIAGNOSIS — Z23 Encounter for immunization: Secondary | ICD-10-CM | POA: Diagnosis not present

## 2017-01-17 DIAGNOSIS — N390 Urinary tract infection, site not specified: Secondary | ICD-10-CM | POA: Diagnosis not present

## 2017-01-17 DIAGNOSIS — Z6824 Body mass index (BMI) 24.0-24.9, adult: Secondary | ICD-10-CM | POA: Diagnosis not present

## 2017-01-17 DIAGNOSIS — Z1211 Encounter for screening for malignant neoplasm of colon: Secondary | ICD-10-CM | POA: Diagnosis not present

## 2017-01-17 DIAGNOSIS — Z0000 Encounter for general adult medical examination without abnormal findings: Secondary | ICD-10-CM | POA: Diagnosis not present

## 2017-02-28 DIAGNOSIS — J441 Chronic obstructive pulmonary disease with (acute) exacerbation: Secondary | ICD-10-CM | POA: Diagnosis not present

## 2017-02-28 DIAGNOSIS — J0101 Acute recurrent maxillary sinusitis: Secondary | ICD-10-CM | POA: Diagnosis not present

## 2017-04-01 DIAGNOSIS — N6321 Unspecified lump in the left breast, upper outer quadrant: Secondary | ICD-10-CM | POA: Diagnosis not present

## 2017-04-07 DIAGNOSIS — N6322 Unspecified lump in the left breast, upper inner quadrant: Secondary | ICD-10-CM | POA: Diagnosis not present

## 2017-04-07 DIAGNOSIS — N6321 Unspecified lump in the left breast, upper outer quadrant: Secondary | ICD-10-CM | POA: Diagnosis not present

## 2017-04-07 DIAGNOSIS — R928 Other abnormal and inconclusive findings on diagnostic imaging of breast: Secondary | ICD-10-CM | POA: Diagnosis not present

## 2017-04-19 DIAGNOSIS — R1310 Dysphagia, unspecified: Secondary | ICD-10-CM | POA: Diagnosis not present

## 2017-04-19 DIAGNOSIS — K219 Gastro-esophageal reflux disease without esophagitis: Secondary | ICD-10-CM | POA: Diagnosis not present

## 2017-05-02 DIAGNOSIS — H26493 Other secondary cataract, bilateral: Secondary | ICD-10-CM | POA: Diagnosis not present

## 2017-05-02 DIAGNOSIS — E113293 Type 2 diabetes mellitus with mild nonproliferative diabetic retinopathy without macular edema, bilateral: Secondary | ICD-10-CM | POA: Diagnosis not present

## 2017-05-02 DIAGNOSIS — H04123 Dry eye syndrome of bilateral lacrimal glands: Secondary | ICD-10-CM | POA: Diagnosis not present

## 2017-06-09 DIAGNOSIS — K449 Diaphragmatic hernia without obstruction or gangrene: Secondary | ICD-10-CM | POA: Diagnosis not present

## 2017-06-09 DIAGNOSIS — R51 Headache: Secondary | ICD-10-CM | POA: Diagnosis not present

## 2017-06-09 DIAGNOSIS — Z79899 Other long term (current) drug therapy: Secondary | ICD-10-CM | POA: Diagnosis not present

## 2017-06-09 DIAGNOSIS — K219 Gastro-esophageal reflux disease without esophagitis: Secondary | ICD-10-CM | POA: Diagnosis not present

## 2017-06-09 DIAGNOSIS — Z794 Long term (current) use of insulin: Secondary | ICD-10-CM | POA: Diagnosis not present

## 2017-06-09 DIAGNOSIS — J449 Chronic obstructive pulmonary disease, unspecified: Secondary | ICD-10-CM | POA: Diagnosis not present

## 2017-06-09 DIAGNOSIS — R1110 Vomiting, unspecified: Secondary | ICD-10-CM | POA: Diagnosis not present

## 2017-06-09 DIAGNOSIS — G459 Transient cerebral ischemic attack, unspecified: Secondary | ICD-10-CM | POA: Diagnosis not present

## 2017-06-09 DIAGNOSIS — I10 Essential (primary) hypertension: Secondary | ICD-10-CM | POA: Diagnosis not present

## 2017-06-09 DIAGNOSIS — Z7984 Long term (current) use of oral hypoglycemic drugs: Secondary | ICD-10-CM | POA: Diagnosis not present

## 2017-06-09 DIAGNOSIS — R079 Chest pain, unspecified: Secondary | ICD-10-CM | POA: Diagnosis not present

## 2017-06-09 DIAGNOSIS — E119 Type 2 diabetes mellitus without complications: Secondary | ICD-10-CM | POA: Diagnosis not present

## 2017-06-09 DIAGNOSIS — R4701 Aphasia: Secondary | ICD-10-CM | POA: Diagnosis not present

## 2017-06-09 DIAGNOSIS — Z8673 Personal history of transient ischemic attack (TIA), and cerebral infarction without residual deficits: Secondary | ICD-10-CM | POA: Diagnosis not present

## 2017-06-10 DIAGNOSIS — R9430 Abnormal result of cardiovascular function study, unspecified: Secondary | ICD-10-CM | POA: Diagnosis not present

## 2017-06-10 DIAGNOSIS — R531 Weakness: Secondary | ICD-10-CM | POA: Diagnosis not present

## 2017-06-10 DIAGNOSIS — I6789 Other cerebrovascular disease: Secondary | ICD-10-CM | POA: Diagnosis not present

## 2017-06-10 DIAGNOSIS — G459 Transient cerebral ischemic attack, unspecified: Secondary | ICD-10-CM | POA: Diagnosis not present

## 2017-06-10 DIAGNOSIS — R079 Chest pain, unspecified: Secondary | ICD-10-CM | POA: Diagnosis not present

## 2017-06-10 DIAGNOSIS — E785 Hyperlipidemia, unspecified: Secondary | ICD-10-CM | POA: Diagnosis not present

## 2017-06-10 DIAGNOSIS — R4701 Aphasia: Secondary | ICD-10-CM | POA: Diagnosis not present

## 2017-06-11 DIAGNOSIS — R079 Chest pain, unspecified: Secondary | ICD-10-CM | POA: Diagnosis not present

## 2017-06-11 DIAGNOSIS — G459 Transient cerebral ischemic attack, unspecified: Secondary | ICD-10-CM | POA: Diagnosis not present

## 2017-06-11 DIAGNOSIS — I6789 Other cerebrovascular disease: Secondary | ICD-10-CM | POA: Diagnosis not present

## 2017-06-17 DIAGNOSIS — N390 Urinary tract infection, site not specified: Secondary | ICD-10-CM | POA: Diagnosis not present

## 2017-06-17 DIAGNOSIS — B373 Candidiasis of vulva and vagina: Secondary | ICD-10-CM | POA: Diagnosis not present

## 2017-06-21 DIAGNOSIS — G458 Other transient cerebral ischemic attacks and related syndromes: Secondary | ICD-10-CM | POA: Diagnosis not present

## 2017-06-21 DIAGNOSIS — R079 Chest pain, unspecified: Secondary | ICD-10-CM | POA: Diagnosis not present

## 2017-06-21 DIAGNOSIS — G459 Transient cerebral ischemic attack, unspecified: Secondary | ICD-10-CM | POA: Diagnosis not present

## 2017-07-21 DIAGNOSIS — N390 Urinary tract infection, site not specified: Secondary | ICD-10-CM | POA: Diagnosis not present

## 2017-07-21 DIAGNOSIS — G458 Other transient cerebral ischemic attacks and related syndromes: Secondary | ICD-10-CM | POA: Diagnosis not present

## 2017-07-21 DIAGNOSIS — N3946 Mixed incontinence: Secondary | ICD-10-CM | POA: Diagnosis not present

## 2017-08-01 DIAGNOSIS — R21 Rash and other nonspecific skin eruption: Secondary | ICD-10-CM | POA: Diagnosis not present

## 2017-08-01 DIAGNOSIS — I80291 Phlebitis and thrombophlebitis of other deep vessels of right lower extremity: Secondary | ICD-10-CM | POA: Diagnosis not present

## 2017-08-01 DIAGNOSIS — M79661 Pain in right lower leg: Secondary | ICD-10-CM | POA: Diagnosis not present

## 2017-08-31 DIAGNOSIS — I10 Essential (primary) hypertension: Secondary | ICD-10-CM | POA: Diagnosis not present

## 2017-08-31 DIAGNOSIS — K219 Gastro-esophageal reflux disease without esophagitis: Secondary | ICD-10-CM | POA: Diagnosis not present

## 2017-08-31 DIAGNOSIS — I69321 Dysphasia following cerebral infarction: Secondary | ICD-10-CM | POA: Diagnosis not present

## 2017-08-31 DIAGNOSIS — Z23 Encounter for immunization: Secondary | ICD-10-CM | POA: Diagnosis not present

## 2017-08-31 DIAGNOSIS — E782 Mixed hyperlipidemia: Secondary | ICD-10-CM | POA: Diagnosis not present

## 2017-08-31 DIAGNOSIS — E1169 Type 2 diabetes mellitus with other specified complication: Secondary | ICD-10-CM | POA: Diagnosis not present

## 2017-09-21 DIAGNOSIS — M85852 Other specified disorders of bone density and structure, left thigh: Secondary | ICD-10-CM | POA: Diagnosis not present

## 2017-09-21 DIAGNOSIS — M85851 Other specified disorders of bone density and structure, right thigh: Secondary | ICD-10-CM | POA: Diagnosis not present

## 2017-09-21 DIAGNOSIS — M810 Age-related osteoporosis without current pathological fracture: Secondary | ICD-10-CM | POA: Diagnosis not present

## 2017-10-10 DIAGNOSIS — N3001 Acute cystitis with hematuria: Secondary | ICD-10-CM | POA: Diagnosis not present

## 2017-11-16 DIAGNOSIS — R1310 Dysphagia, unspecified: Secondary | ICD-10-CM | POA: Diagnosis not present

## 2017-11-16 DIAGNOSIS — K219 Gastro-esophageal reflux disease without esophagitis: Secondary | ICD-10-CM | POA: Diagnosis not present

## 2017-11-16 DIAGNOSIS — K222 Esophageal obstruction: Secondary | ICD-10-CM | POA: Diagnosis not present

## 2017-12-02 DIAGNOSIS — Z794 Long term (current) use of insulin: Secondary | ICD-10-CM | POA: Diagnosis not present

## 2017-12-02 DIAGNOSIS — I10 Essential (primary) hypertension: Secondary | ICD-10-CM | POA: Diagnosis not present

## 2017-12-02 DIAGNOSIS — K219 Gastro-esophageal reflux disease without esophagitis: Secondary | ICD-10-CM | POA: Diagnosis not present

## 2017-12-02 DIAGNOSIS — Z7982 Long term (current) use of aspirin: Secondary | ICD-10-CM | POA: Diagnosis not present

## 2017-12-02 DIAGNOSIS — R1310 Dysphagia, unspecified: Secondary | ICD-10-CM | POA: Diagnosis not present

## 2017-12-02 DIAGNOSIS — K449 Diaphragmatic hernia without obstruction or gangrene: Secondary | ICD-10-CM | POA: Diagnosis not present

## 2017-12-02 DIAGNOSIS — K222 Esophageal obstruction: Secondary | ICD-10-CM | POA: Diagnosis not present

## 2017-12-02 DIAGNOSIS — E119 Type 2 diabetes mellitus without complications: Secondary | ICD-10-CM | POA: Diagnosis not present

## 2017-12-02 DIAGNOSIS — Z79899 Other long term (current) drug therapy: Secondary | ICD-10-CM | POA: Diagnosis not present

## 2017-12-02 DIAGNOSIS — Z8673 Personal history of transient ischemic attack (TIA), and cerebral infarction without residual deficits: Secondary | ICD-10-CM | POA: Diagnosis not present

## 2017-12-02 DIAGNOSIS — E7800 Pure hypercholesterolemia, unspecified: Secondary | ICD-10-CM | POA: Diagnosis not present

## 2017-12-02 DIAGNOSIS — J449 Chronic obstructive pulmonary disease, unspecified: Secondary | ICD-10-CM | POA: Diagnosis not present

## 2017-12-07 DIAGNOSIS — H01004 Unspecified blepharitis left upper eyelid: Secondary | ICD-10-CM | POA: Diagnosis not present

## 2017-12-07 DIAGNOSIS — H01001 Unspecified blepharitis right upper eyelid: Secondary | ICD-10-CM | POA: Diagnosis not present

## 2017-12-07 DIAGNOSIS — E119 Type 2 diabetes mellitus without complications: Secondary | ICD-10-CM | POA: Diagnosis not present

## 2017-12-07 DIAGNOSIS — H353131 Nonexudative age-related macular degeneration, bilateral, early dry stage: Secondary | ICD-10-CM | POA: Diagnosis not present

## 2017-12-19 DIAGNOSIS — G458 Other transient cerebral ischemic attacks and related syndromes: Secondary | ICD-10-CM | POA: Diagnosis not present

## 2017-12-19 DIAGNOSIS — E1169 Type 2 diabetes mellitus with other specified complication: Secondary | ICD-10-CM | POA: Diagnosis not present

## 2017-12-19 DIAGNOSIS — J449 Chronic obstructive pulmonary disease, unspecified: Secondary | ICD-10-CM | POA: Diagnosis not present

## 2017-12-19 DIAGNOSIS — E782 Mixed hyperlipidemia: Secondary | ICD-10-CM | POA: Diagnosis not present

## 2017-12-19 DIAGNOSIS — Z6824 Body mass index (BMI) 24.0-24.9, adult: Secondary | ICD-10-CM | POA: Diagnosis not present

## 2017-12-19 DIAGNOSIS — Z23 Encounter for immunization: Secondary | ICD-10-CM | POA: Diagnosis not present

## 2017-12-19 DIAGNOSIS — K219 Gastro-esophageal reflux disease without esophagitis: Secondary | ICD-10-CM | POA: Diagnosis not present

## 2017-12-19 DIAGNOSIS — Z794 Long term (current) use of insulin: Secondary | ICD-10-CM | POA: Diagnosis not present

## 2017-12-19 DIAGNOSIS — I10 Essential (primary) hypertension: Secondary | ICD-10-CM | POA: Diagnosis not present

## 2017-12-19 DIAGNOSIS — N3946 Mixed incontinence: Secondary | ICD-10-CM | POA: Diagnosis not present

## 2018-01-19 DIAGNOSIS — N3000 Acute cystitis without hematuria: Secondary | ICD-10-CM | POA: Diagnosis not present

## 2018-01-31 DIAGNOSIS — J441 Chronic obstructive pulmonary disease with (acute) exacerbation: Secondary | ICD-10-CM | POA: Diagnosis not present

## 2018-01-31 DIAGNOSIS — N3000 Acute cystitis without hematuria: Secondary | ICD-10-CM | POA: Diagnosis not present

## 2018-05-08 DIAGNOSIS — Z6825 Body mass index (BMI) 25.0-25.9, adult: Secondary | ICD-10-CM | POA: Diagnosis not present

## 2018-05-08 DIAGNOSIS — N952 Postmenopausal atrophic vaginitis: Secondary | ICD-10-CM | POA: Diagnosis not present

## 2018-05-08 DIAGNOSIS — N3000 Acute cystitis without hematuria: Secondary | ICD-10-CM | POA: Diagnosis not present

## 2018-05-22 DIAGNOSIS — R300 Dysuria: Secondary | ICD-10-CM | POA: Diagnosis not present

## 2018-05-22 DIAGNOSIS — Z6825 Body mass index (BMI) 25.0-25.9, adult: Secondary | ICD-10-CM | POA: Diagnosis not present

## 2018-05-22 DIAGNOSIS — J029 Acute pharyngitis, unspecified: Secondary | ICD-10-CM | POA: Diagnosis not present

## 2018-05-22 DIAGNOSIS — E1169 Type 2 diabetes mellitus with other specified complication: Secondary | ICD-10-CM | POA: Diagnosis not present

## 2018-05-29 DIAGNOSIS — J1008 Influenza due to other identified influenza virus with other specified pneumonia: Secondary | ICD-10-CM | POA: Diagnosis not present

## 2018-05-29 DIAGNOSIS — R197 Diarrhea, unspecified: Secondary | ICD-10-CM | POA: Diagnosis not present

## 2018-05-31 DIAGNOSIS — R197 Diarrhea, unspecified: Secondary | ICD-10-CM | POA: Diagnosis not present

## 2018-08-21 DIAGNOSIS — N3000 Acute cystitis without hematuria: Secondary | ICD-10-CM | POA: Diagnosis not present

## 2018-08-21 DIAGNOSIS — R634 Abnormal weight loss: Secondary | ICD-10-CM | POA: Diagnosis not present

## 2018-08-30 DIAGNOSIS — S299XXA Unspecified injury of thorax, initial encounter: Secondary | ICD-10-CM | POA: Diagnosis not present

## 2018-08-30 DIAGNOSIS — S2232XA Fracture of one rib, left side, initial encounter for closed fracture: Secondary | ICD-10-CM | POA: Diagnosis not present

## 2018-08-30 DIAGNOSIS — Z1159 Encounter for screening for other viral diseases: Secondary | ICD-10-CM | POA: Diagnosis not present

## 2018-09-06 DIAGNOSIS — N3946 Mixed incontinence: Secondary | ICD-10-CM | POA: Diagnosis not present

## 2018-09-06 DIAGNOSIS — J449 Chronic obstructive pulmonary disease, unspecified: Secondary | ICD-10-CM | POA: Diagnosis not present

## 2018-09-06 DIAGNOSIS — I6350 Cerebral infarction due to unspecified occlusion or stenosis of unspecified cerebral artery: Secondary | ICD-10-CM | POA: Diagnosis not present

## 2018-09-06 DIAGNOSIS — B91 Sequelae of poliomyelitis: Secondary | ICD-10-CM | POA: Diagnosis not present

## 2018-09-06 DIAGNOSIS — E782 Mixed hyperlipidemia: Secondary | ICD-10-CM | POA: Diagnosis not present

## 2018-09-06 DIAGNOSIS — Z1159 Encounter for screening for other viral diseases: Secondary | ICD-10-CM | POA: Diagnosis not present

## 2018-09-06 DIAGNOSIS — K219 Gastro-esophageal reflux disease without esophagitis: Secondary | ICD-10-CM | POA: Diagnosis not present

## 2018-09-06 DIAGNOSIS — Z794 Long term (current) use of insulin: Secondary | ICD-10-CM | POA: Diagnosis not present

## 2018-09-06 DIAGNOSIS — Z6823 Body mass index (BMI) 23.0-23.9, adult: Secondary | ICD-10-CM | POA: Diagnosis not present

## 2018-09-06 DIAGNOSIS — E1169 Type 2 diabetes mellitus with other specified complication: Secondary | ICD-10-CM | POA: Diagnosis not present

## 2018-09-06 DIAGNOSIS — M810 Age-related osteoporosis without current pathological fracture: Secondary | ICD-10-CM | POA: Diagnosis not present

## 2018-09-19 DIAGNOSIS — Z0000 Encounter for general adult medical examination without abnormal findings: Secondary | ICD-10-CM | POA: Diagnosis not present

## 2018-09-19 DIAGNOSIS — Z6823 Body mass index (BMI) 23.0-23.9, adult: Secondary | ICD-10-CM | POA: Diagnosis not present

## 2018-11-09 DIAGNOSIS — Z23 Encounter for immunization: Secondary | ICD-10-CM | POA: Diagnosis not present

## 2018-11-16 DIAGNOSIS — Z1159 Encounter for screening for other viral diseases: Secondary | ICD-10-CM | POA: Diagnosis not present

## 2018-11-16 DIAGNOSIS — N3000 Acute cystitis without hematuria: Secondary | ICD-10-CM | POA: Diagnosis not present

## 2018-11-30 DIAGNOSIS — Z1231 Encounter for screening mammogram for malignant neoplasm of breast: Secondary | ICD-10-CM | POA: Diagnosis not present

## 2018-12-19 DIAGNOSIS — I69321 Dysphasia following cerebral infarction: Secondary | ICD-10-CM | POA: Diagnosis not present

## 2018-12-19 DIAGNOSIS — Z6823 Body mass index (BMI) 23.0-23.9, adult: Secondary | ICD-10-CM | POA: Diagnosis not present

## 2018-12-19 DIAGNOSIS — N3946 Mixed incontinence: Secondary | ICD-10-CM | POA: Diagnosis not present

## 2018-12-19 DIAGNOSIS — K219 Gastro-esophageal reflux disease without esophagitis: Secondary | ICD-10-CM | POA: Diagnosis not present

## 2018-12-19 DIAGNOSIS — Z1159 Encounter for screening for other viral diseases: Secondary | ICD-10-CM | POA: Diagnosis not present

## 2018-12-19 DIAGNOSIS — M810 Age-related osteoporosis without current pathological fracture: Secondary | ICD-10-CM | POA: Diagnosis not present

## 2018-12-19 DIAGNOSIS — E1169 Type 2 diabetes mellitus with other specified complication: Secondary | ICD-10-CM | POA: Diagnosis not present

## 2018-12-19 DIAGNOSIS — B91 Sequelae of poliomyelitis: Secondary | ICD-10-CM | POA: Diagnosis not present

## 2019-01-22 DIAGNOSIS — Z1159 Encounter for screening for other viral diseases: Secondary | ICD-10-CM | POA: Diagnosis not present

## 2019-01-22 DIAGNOSIS — R1084 Generalized abdominal pain: Secondary | ICD-10-CM | POA: Diagnosis not present

## 2019-01-22 DIAGNOSIS — R197 Diarrhea, unspecified: Secondary | ICD-10-CM | POA: Diagnosis not present

## 2019-02-15 DIAGNOSIS — K5712 Diverticulitis of small intestine without perforation or abscess without bleeding: Secondary | ICD-10-CM | POA: Diagnosis not present

## 2019-02-15 DIAGNOSIS — Z1159 Encounter for screening for other viral diseases: Secondary | ICD-10-CM | POA: Diagnosis not present

## 2019-02-15 DIAGNOSIS — N3000 Acute cystitis without hematuria: Secondary | ICD-10-CM | POA: Diagnosis not present

## 2019-03-22 DIAGNOSIS — K5712 Diverticulitis of small intestine without perforation or abscess without bleeding: Secondary | ICD-10-CM | POA: Diagnosis not present

## 2019-03-22 DIAGNOSIS — R1084 Generalized abdominal pain: Secondary | ICD-10-CM | POA: Diagnosis not present

## 2019-04-23 ENCOUNTER — Other Ambulatory Visit: Payer: Self-pay

## 2019-04-24 MED ORDER — ESTRADIOL 0.1 MG/GM VA CREA: 1.0000 | TOPICAL_CREAM | VAGINAL | 3 refills | Status: DC

## 2019-06-06 ENCOUNTER — Other Ambulatory Visit: Payer: Self-pay | Admitting: Legal Medicine

## 2019-06-11 ENCOUNTER — Other Ambulatory Visit: Payer: Self-pay

## 2019-06-11 MED ORDER — PANTOPRAZOLE SODIUM 40 MG PO TBEC: 40.0000 mg | DELAYED_RELEASE_TABLET | Freq: Every day | ORAL | 2 refills | Status: DC

## 2019-06-15 DIAGNOSIS — R32 Unspecified urinary incontinence: Secondary | ICD-10-CM | POA: Diagnosis not present

## 2019-06-18 ENCOUNTER — Ambulatory Visit (INDEPENDENT_AMBULATORY_CARE_PROVIDER_SITE_OTHER): Payer: Medicare HMO | Admitting: Legal Medicine

## 2019-06-18 ENCOUNTER — Other Ambulatory Visit: Payer: Self-pay

## 2019-06-18 ENCOUNTER — Encounter: Payer: Self-pay | Admitting: Legal Medicine

## 2019-06-18 VITALS — BP 116/60 | HR 55 | Temp 95.4°F | Resp 17 | Ht 59.45 in | Wt 139.4 lb

## 2019-06-18 DIAGNOSIS — M810 Age-related osteoporosis without current pathological fracture: Secondary | ICD-10-CM | POA: Insufficient documentation

## 2019-06-18 DIAGNOSIS — E782 Mixed hyperlipidemia: Secondary | ICD-10-CM

## 2019-06-18 DIAGNOSIS — E1169 Type 2 diabetes mellitus with other specified complication: Secondary | ICD-10-CM | POA: Diagnosis not present

## 2019-06-18 DIAGNOSIS — I69321 Dysphasia following cerebral infarction: Secondary | ICD-10-CM

## 2019-06-18 DIAGNOSIS — B91 Sequelae of poliomyelitis: Secondary | ICD-10-CM

## 2019-06-18 DIAGNOSIS — R0789 Other chest pain: Secondary | ICD-10-CM | POA: Insufficient documentation

## 2019-06-18 DIAGNOSIS — S299XXA Unspecified injury of thorax, initial encounter: Secondary | ICD-10-CM | POA: Diagnosis not present

## 2019-06-18 DIAGNOSIS — R0781 Pleurodynia: Secondary | ICD-10-CM | POA: Insufficient documentation

## 2019-06-18 DIAGNOSIS — Z794 Long term (current) use of insulin: Secondary | ICD-10-CM

## 2019-06-18 DIAGNOSIS — N952 Postmenopausal atrophic vaginitis: Secondary | ICD-10-CM | POA: Insufficient documentation

## 2019-06-18 DIAGNOSIS — N3946 Mixed incontinence: Secondary | ICD-10-CM

## 2019-06-18 HISTORY — DX: Mixed hyperlipidemia: E78.2

## 2019-06-18 HISTORY — DX: Mixed incontinence: N39.46

## 2019-06-18 HISTORY — DX: Dysphasia following cerebral infarction: I69.321

## 2019-06-18 HISTORY — DX: Age-related osteoporosis without current pathological fracture: M81.0

## 2019-06-18 HISTORY — DX: Type 2 diabetes mellitus with other specified complication: E11.69

## 2019-06-18 HISTORY — DX: Sequelae of poliomyelitis: B91

## 2019-06-18 NOTE — Patient Instructions (Signed)
Rib Fracture  A rib fracture is a break or crack in one of the bones of the ribs. The ribs are like a cage that goes around your upper chest. A broken or cracked rib is often painful, but most do not cause other problems. Most rib fractures usually heal on their own in 1-3 months. Follow these instructions at home: Managing pain, stiffness, and swelling  If directed, apply ice to the injured area. ? Put ice in a plastic bag. ? Place a towel between your skin and the bag. ? Leave the ice on for 20 minutes, 2-3 times a day.  Take over-the-counter and prescription medicines only as told by your doctor. Activity  Avoid activities that cause pain to the injured area. Protect your injured area.  Slowly increase activity as told by your doctor. General instructions  Do deep breathing as told by your doctor. You may be told to: ? Take deep breaths many times a day. ? Cough many times a day while hugging a pillow. ? Use a device (incentive spirometer) to do deep breathing many times a day.  Drink enough fluid to keep your pee (urine) clear or pale yellow.  Do not wear a rib belt or binder. These do not allow you to breathe deeply.  Keep all follow-up visits as told by your doctor. This is important. Contact a doctor if:  You have a fever. Get help right away if:  You have trouble breathing.  You are short of breath.  You cannot stop coughing.  You cough up thick or bloody spit (sputum).  You feel sick to your stomach (nauseous), throw up (vomit), or have belly (abdominal) pain.  Your pain gets worse and medicine does not help. Summary  A rib fracture is a break or crack in one of the bones of the ribs.  Apply ice to the injured area and take medicines for pain as told by your doctor.  Take deep breaths and cough many times a day. Hug a pillow every time you cough. This information is not intended to replace advice given to you by your health care provider. Make sure you  discuss any questions you have with your health care provider. Document Revised: 02/11/2017 Document Reviewed: 06/01/2016 Elsevier Patient Education  2020 Elsevier Inc.  

## 2019-06-18 NOTE — Assessment & Plan Note (Signed)
Patient fell on right ribs and possibly has right rib fractures.  She is to get rib x-rays today.  He has pain medicines at home.

## 2019-06-18 NOTE — Progress Notes (Signed)
Acute Office Visit  Subjective:    Patient ID: Rebecca Hodge, female    DOB: 1940-10-05, 79 y.o.   MRN: [id number]  Chief Complaint  Patient presents with  . Right Flank pain    Since yesterday.  . Fall    Patient fell down yesterday  . Memory Loss    Since 3 months ago daughter had noticed    HPI Patient is in today for Patient fell 06/16/2019 in yard when stepped into a hole in lawn. She fel on right side and hurt in ribs.  No LOC.  Can move all extremities.    Past Medical History:  Diagnosis Date  . Age-related osteoporosis without current pathological fracture 06/18/2019  . Allergic rhinitis   . Diabetes ([place])   . Dysphasia following cerebral infarction 06/18/2019  . Hyperlipidemia   . Hypertension   . Mixed hyperlipidemia 06/18/2019  . Mixed incontinence 06/18/2019  . Sequelae of poliomyelitis 06/18/2019  . Type 2 diabetes mellitus with other specified complication ([place]) [id number]    Past Surgical History:  Procedure Laterality Date  . ABDOMINAL HYSTERECTOMY    . CATARACT EXTRACTION Right   . CHOLECYSTECTOMY    . ROTATOR CUFF REPAIR Bilateral     History reviewed. No pertinent family history.  Social History   Socioeconomic History  . Marital status: Widowed    Spouse name: Not on file  . Number of children: Not on file  . Years of education: Not on file  . Highest education level: Not on file  Occupational History  . Occupation: Disabled  Tobacco Use  . Smoking status: Former Smoker    Packs/day: 1.00    Years: 5.00    Pack years: 5.00    Types: Cigarettes    Quit date: 03/16/1987    Years since quitting: 32.2  . Smokeless tobacco: Never Used  Substance and Sexual Activity  . Alcohol use: No  . Drug use: No  . Sexual activity: Not on file  Other Topics Concern  . Not on file  Social History Narrative  . Not on file   Social Determinants of Health   Financial Resource Strain:   . Difficulty of Paying Living Expenses:   Food Insecurity:   .  Worried About Charity fundraiser in the Last Year:   . Arboriculturist in the Last Year:   Transportation Needs:   . Film/video editor (Medical):   Marland Kitchen Lack of Transportation (Non-Medical):   Physical Activity:   . Days of Exercise per Week:   . Minutes of Exercise per Session:   Stress:   . Feeling of Stress :   Social Connections:   . Frequency of Communication with Friends and Family:   . Frequency of Social Gatherings with Friends and Family:   . Attends Religious Services:   . Active Member of Clubs or Organizations:   . Attends Archivist Meetings:   Marland Kitchen Marital Status:   Intimate Partner Violence:   . Fear of Current or Ex-Partner:   . Emotionally Abused:   Marland Kitchen Physically Abused:   . Sexually Abused:     Outpatient Medications Prior to Visit  Medication Sig Dispense Refill  . alendronate (FOSAMAX) 70 MG tablet Take 70 mg by mouth once a week.    . BD PEN NEEDLE NANO U/F 32G X 4 MM MISC See admin instructions.    Marland Kitchen esomeprazole (NEXIUM) 40 MG capsule Take 40 mg by mouth daily before breakfast.    .  estradiol (ESTRACE) 0.1 MG/GM vaginal cream Place 1 Applicatorful vaginally 3 (three) times a week. 42.5 g 3  . ezetimibe (ZETIA) 10 MG tablet Take 10 mg by mouth daily.    Marland Kitchen gabapentin (NEURONTIN) 300 MG capsule Take 300 mg by mouth 3 (three) times daily.    Marland Kitchen glimepiride (AMARYL) 1 MG tablet Take 1 mg by mouth daily before breakfast.    . insulin detemir (LEVEMIR) 100 UNIT/ML injection Inject 20 Units into the skin at bedtime.    . Liraglutide (VICTOZA) 18 MG/3ML SOPN Inject into the skin daily.    Marland Kitchen lisinopril (PRINIVIL,ZESTRIL) 20 MG tablet Take 20 mg by mouth daily.    . metoprolol tartrate (LOPRESSOR) 25 MG tablet Take 25 mg by mouth 2 (two) times daily.    . nabumetone (RELAFEN) 500 MG tablet Take 500 mg by mouth 2 (two) times daily.    . pantoprazole (PROTONIX) 40 MG tablet Take 1 tablet (40 mg total) by mouth daily. 90 tablet 2  . rosuvastatin (CRESTOR) 20 MG  tablet Take 20 mg by mouth daily.    Marland Kitchen azelastine (ASTELIN) 0.1 % nasal spray USE 1 SPRAY IN EACH NOSTRIL TWICE DAILY 30 mL 4  . metoprolol succinate (TOPROL-XL) 25 MG 24 hr tablet Take 25 mg by mouth 2 (two) times daily.     No facility-administered medications prior to visit.    Allergies  Allergen Reactions  . Codeine   . Morphine And Related   . Sulfa Antibiotics     Review of Systems  Constitutional: Negative.   HENT: Negative.   Eyes: Negative.   Respiratory:       Right chest wall pain  Cardiovascular: Negative.   Gastrointestinal: Negative.   Genitourinary: Negative.   Musculoskeletal: Negative.   Skin: Negative.   Psychiatric/Behavioral: Negative.        Objective:    Physical Exam Vitals reviewed.  Constitutional:      Appearance: Normal appearance.  Cardiovascular:     Rate and Rhythm: Normal rate and regular rhythm.     Pulses: Normal pulses.     Heart sounds: Normal heart sounds.  Pulmonary:     Comments: Pain over right lower chest wall., lungs clear. Chest:     Chest wall: Tenderness present.  Musculoskeletal:        General: Normal range of motion.     Cervical back: Normal range of motion and neck supple.  Neurological:     General: No focal deficit present.     Mental Status: She is alert and oriented to person, place, and time.     BP 116/60 (BP Location: Right Arm, Patient Position: Sitting)   Pulse (!) 55   Temp (!) 95.4 F (35.2 C) (Temporal)   Resp 17   Ht 4' 11.45" (1.51 m)   Wt 139 lb 6.4 oz (63.2 kg)   SpO2 95%   BMI 27.73 kg/m  Wt Readings from Last 3 Encounters:  06/18/19 139 lb 6.4 oz (63.2 kg)  07/13/13 155 lb (70.3 kg)  12/13/12 160 lb (72.6 kg)    Health Maintenance Due  Topic Date Due  . FOOT EXAM  Never done  . OPHTHALMOLOGY EXAM  Never done  . TETANUS/TDAP  Never done    There are no preventive care reminders to display for this patient.   No results found for: TSH Lab Results  Component Value Date    WBC 7.7 09/10/2008   HGB 13.6 09/10/2008   HCT 40.5 09/10/2008  MCV 87.4 09/10/2008   PLT 278 09/10/2008   Lab Results  Component Value Date   NA 139 09/10/2008   K 4.4 09/10/2008   CO2 29 09/10/2008   GLUCOSE 139 (H) 09/10/2008   BUN 9 09/10/2008   CREATININE 0.68 09/10/2008   CALCIUM 9.5 09/10/2008   No results found for: CHOL No results found for: HDL No results found for: LDLCALC No results found for: TRIG No results found for: CHOLHDL No results found for: HGBA1C     Assessment & Plan:   Problem List Items Addressed This Visit      Endocrine   Type 2 diabetes mellitus with other specified complication [facility])    An individual care plan was established and reinforced today for diabetes.  The patient's status was assessed using clinical findings on exam, labs and diagnostic testing. Patient success at meeting goals based on disease specific evidence-based guidelines and found to be good controlled. Medications were assessed and patient's understanding of the medical issues , including barriers were assessed. Recommend adherence to a diabetic diet, a graduated exercise program, HgbA1c level is checked quarterly, and urine microalbumin performed yearly .  Annual mono-filament sensation testing performed. Lower blood pressure and control hyperlipidemia is important. Get annual eye exams and annual flu shots and smoking cessation discussed.  Self management goals were discussed.        Musculoskeletal and Integument   Age-related osteoporosis without current pathological fracture    Patient has osteoporosis and possible right rib fractures.      Relevant Medications   alendronate (FOSAMAX) 70 MG tablet     Other   Mixed hyperlipidemia    AN INDIVIDUAL CARE PLAN for hyperlipidemia/ cholesterol was established and reinforced today.  The patient's status was assessed using clinical findings on exam, lab and other diagnostic tests. The patient's disease status was assessed  based on evidence-based guidelines and found to be well controlled. MEDICATIONS were reviewed. SELF MANAGEMENT GOALS have been discussed and patient's success at attaining the goal of low cholesterol was assessed. RECOMMENDATION given include regular exercise 3 days a week and low cholesterol/low fat diet. CLINICAL SUMMARY including written plan to identify barriers unique to the patient due to social or economic  reasons was discussed.      Rib pain    Patient fell on right ribs and possibly has right rib fractures.  She is to get rib x-rays today.  He has pain medicines at home.       Other Visit Diagnoses    Rib pain on right side    -  Primary   Relevant Orders   DG Ribs Unilateral Right     Return in about 1 week (around 06/25/2019).  No orders of the defined types were placed in this encounter.    Reinaldo Meeker, MD

## 2019-06-18 NOTE — Assessment & Plan Note (Signed)
Patient has osteoporosis and possible right rib fractures.

## 2019-06-18 NOTE — Assessment & Plan Note (Addendum)
>>  ASSESSMENT AND PLAN FOR COMBINED HYPERLIPIDEMIA ASSOCIATED WITH TYPE 2 DIABETES MELLITUS (HCC) WRITTEN ON 06/18/2019  4:20 PM BY Lauriana Denes, Mickle Mallory, MD  An individual care plan was established and reinforced today for diabetes.  The patient's status was assessed using clinical findings on exam, labs and diagnostic testing. Patient success at meeting goals based on disease specific evidence-based guidelines and found to be good controlled. Medications were assessed and patient's understanding of the medical issues , including barriers were assessed. Recommend adherence to a diabetic diet, a graduated exercise program, HgbA1c level is checked quarterly, and urine microalbumin performed yearly .  Annual mono-filament sensation testing performed. Lower blood pressure and control hyperlipidemia is important. Get annual eye exams and annual flu shots and smoking cessation discussed.  Self management goals were discussed.   >>ASSESSMENT AND PLAN FOR MIXED HYPERLIPIDEMIA WRITTEN ON 06/18/2019  4:23 PM BY Hasana Alcorta, Mickle Mallory, MD  AN INDIVIDUAL CARE PLAN for hyperlipidemia/ cholesterol was established and reinforced today.  The patient's status was assessed using clinical findings on exam, lab and other diagnostic tests. The patient's disease status was assessed based on evidence-based guidelines and found to be well controlled. MEDICATIONS were reviewed. SELF MANAGEMENT GOALS have been discussed and patient's success at attaining the goal of low cholesterol was assessed. RECOMMENDATION given include regular exercise 3 days a week and low cholesterol/low fat diet. CLINICAL SUMMARY including written plan to identify barriers unique to the patient due to social or economic  reasons was discussed.

## 2019-06-18 NOTE — Assessment & Plan Note (Signed)

## 2019-06-19 ENCOUNTER — Other Ambulatory Visit: Payer: Self-pay | Admitting: Legal Medicine

## 2019-06-19 MED ORDER — IPRATROPIUM BROMIDE 0.03 % NA SOLN: 2.0000 | Freq: Two times a day (BID) | NASAL | 3 refills | Status: DC

## 2019-06-25 ENCOUNTER — Encounter: Payer: Self-pay | Admitting: Legal Medicine

## 2019-06-25 ENCOUNTER — Other Ambulatory Visit: Payer: Self-pay

## 2019-06-25 ENCOUNTER — Ambulatory Visit (INDEPENDENT_AMBULATORY_CARE_PROVIDER_SITE_OTHER): Payer: Medicare HMO | Admitting: Legal Medicine

## 2019-06-25 VITALS — BP 118/80 | HR 74 | Temp 97.5°F | Resp 17 | Ht 59.45 in | Wt 140.6 lb

## 2019-06-25 DIAGNOSIS — N3946 Mixed incontinence: Secondary | ICD-10-CM

## 2019-06-25 DIAGNOSIS — M810 Age-related osteoporosis without current pathological fracture: Secondary | ICD-10-CM

## 2019-06-25 DIAGNOSIS — R0781 Pleurodynia: Secondary | ICD-10-CM

## 2019-06-25 DIAGNOSIS — E1169 Type 2 diabetes mellitus with other specified complication: Secondary | ICD-10-CM

## 2019-06-25 DIAGNOSIS — Z794 Long term (current) use of insulin: Secondary | ICD-10-CM | POA: Diagnosis not present

## 2019-06-25 DIAGNOSIS — E782 Mixed hyperlipidemia: Secondary | ICD-10-CM

## 2019-06-25 LAB — POCT UA - MICROALBUMIN: Microalbumin Ur, POC: 30 mg/L

## 2019-06-25 NOTE — Assessment & Plan Note (Signed)
AN INDIVIDUAL CARE PLAN for osteoporosis was established and reinforced today.  The patient's status was assessed using clinical findings on exam, labs, and other diagnostic testing. Patient's success at meeting treatment goals based on disease specific evidence-bassed guidelines and found to be in good control. RECOMMENDATIONS include maintaining present medicines and treatment.

## 2019-06-25 NOTE — Assessment & Plan Note (Signed)
Patient still having rib pain but x-ray demonstrates no fractures.

## 2019-06-25 NOTE — Assessment & Plan Note (Signed)
Patient doing well on insulin.  No hypoglycemia.

## 2019-06-25 NOTE — Assessment & Plan Note (Signed)

## 2019-06-25 NOTE — Assessment & Plan Note (Signed)
AN INDIVIDUAL CARE PLAN for incontinence was established and reinforced today.  The patient's status was assessed using clinical findings on exam, labs, and other diagnostic testing. Patient's success at meeting treatment goals based on disease specific evidence-bassed guidelines and found to be in good control. RECOMMENDATIONS include maintaining present medicines and treatment.

## 2019-06-25 NOTE — Assessment & Plan Note (Addendum)
>>  ASSESSMENT AND PLAN FOR COMBINED HYPERLIPIDEMIA ASSOCIATED WITH TYPE 2 DIABETES MELLITUS (HCC) WRITTEN ON 06/25/2019  6:42 PM BY Antwane Grose, Mickle Mallory, MD  An individual care plan for diabetes was established and reinforced today.  The patient's status was assessed using clinical findings on exam, labs and diagnostic testing. Patient success at meeting goals based on disease specific evidence-based guidelines and found to be good controlled. Medications were assessed and patient's understanding of the medical issues , including barriers were assessed. Recommend adherence to a diabetic diet, a graduated exercise program, HgbA1c level is checked quarterly, and urine microalbumin performed yearly .  Annual mono-filament sensation testing performed. Lower blood pressure and control hyperlipidemia is important. Get annual eye exams and annual flu shots and smoking cessation discussed.  Self management goals were discussed.   >>ASSESSMENT AND PLAN FOR MIXED HYPERLIPIDEMIA WRITTEN ON 06/25/2019  6:44 PM BY Fuller Makin, Mickle Mallory, MD  AN INDIVIDUAL CARE PLAN for hyperlipidemia/ cholesterol was established and reinforced today.  The patient's status was assessed using clinical findings on exam, lab and other diagnostic tests. The patient's disease status was assessed based on evidence-based guidelines and found to be well controlled. MEDICATIONS were reviewed. SELF MANAGEMENT GOALS have been discussed and patient's success at attaining the goal of low cholesterol was assessed. RECOMMENDATION given include regular exercise 3 days a week and low cholesterol/low fat diet. CLINICAL SUMMARY including written plan to identify barriers unique to the patient due to social or economic  reasons was discussed.

## 2019-06-25 NOTE — Progress Notes (Signed)
Established Patient Office Visit  Subjective:  Patient ID: Rebecca Hodge, female    DOB: 11/01/1940  Age: 79 y.o. MRN: [id number]  CC:  Chief Complaint  Patient presents with  . Diabetes  . Hyperlipidemia  . Hypertension    HPI Rebecca Hodge presents for Chronic visit  Patient present with type 2 diabetes.  Specifically, this is type 2, insulin dependent requiring diabetes, compliplicated Hypertension and hypercholesterolemia .  Compliance with treatment has been good; patient take medicines as directed, maintains diet and exercise regimen, follows up as directed, and is keeping glucose diary.  Date of  diagnosis 2010.  Rebecca Hodge screen has been performed.Rebecca Hodge screen 10. Current medicines for diabetes liraglutide,levemir .  Patient is on lisinopril for renal protection and zetia for cholesterol control.  Patient performs foot exams daily and last ophthalmologic exam was one year  Patient presents for follow up of hypertension.  Patient tolerating lsinopril, metorprolol well with side effects.  Patient was diagnosed with hypertension 2010 so has been treated for hypertension for 10 years.Patient is working on maintaining diet and exercise regimen and follows up as directed. Complication include none.  Patient presents with hyperlipidemia.  Compliance with treatment has been good; patient takes medicines as directed, maintains low cholesterol diet, follows up as directed, and maintains exercise regimen.  Patient is using zetia without problems..  Past Medical History:  Diagnosis Date  . Age-related osteoporosis without current pathological fracture 06/18/2019  . Allergic rhinitis   . Diabetes (Rebecca Hodge)   . Dysphasia following cerebral infarction 06/18/2019  . Hyperlipidemia   . Hypertension   . Mixed hyperlipidemia 06/18/2019  . Mixed incontinence 06/18/2019  . Sequelae of poliomyelitis 06/18/2019  . Type 2 diabetes mellitus with other specified complication (Rebecca Hodge) [id number]    Past  Surgical History:  Procedure Laterality Date  . ABDOMINAL HYSTERECTOMY    . CATARACT EXTRACTION Right   . CHOLECYSTECTOMY    . ROTATOR CUFF REPAIR Bilateral     No family history on file.  Social History   Socioeconomic History  . Marital status: Widowed    Spouse name: Not on file  . Number of children: Not on file  . Years of education: Not on file  . Highest education level: Not on file  Occupational History  . Occupation: Disabled  Rebecca Hodge Use  . Smoking status: Former Smoker    Packs/day: 1.00    Years: 5.00    Pack years: 5.00    Types: Cigarettes    Quit date: 03/16/1987    Years since quitting: 32.2  . Smokeless Rebecca Hodge: Never Used  Substance and Sexual Activity  . Alcohol use: No  . Drug use: No  . Sexual activity: Not Currently  Other Topics Concern  . Not on file  Social History Narrative  . Not on file   Social Determinants of Health   Financial Resource Strain:   . Difficulty of Paying Living Expenses:   Food Insecurity:   . Worried About Charity fundraiser in the Last Year:   . Arboriculturist in the Last Year:   Transportation Needs:   . Film/video editor (Medical):   Rebecca Hodge Lack of Transportation (Non-Medical):   Physical Activity:   . Days of Exercise per Week:   . Minutes of Exercise per Session:   Stress:   . Feeling of Stress :   Social Connections:   . Frequency of Communication with Friends and Family:   . Frequency of Social Gatherings  with Friends and Family:   . Attends Religious Services:   . Active Member of Clubs or Organizations:   . Attends Archivist Meetings:   Rebecca Hodge Marital Status:   Intimate Partner Violence:   . Fear of Current or Ex-Partner:   . Emotionally Abused:   Rebecca Hodge Physically Abused:   . Sexually Abused:     Outpatient Medications Prior to Visit  Medication Sig Dispense Refill  . alendronate (FOSAMAX) 70 MG tablet Take 70 mg by mouth once a week.    . BD PEN NEEDLE NANO U/F 32G X 4 MM MISC See admin  instructions.    Rebecca Hodge esomeprazole (NEXIUM) 40 MG capsule Take 40 mg by mouth daily before breakfast.    . estradiol (ESTRACE) 0.1 MG/GM vaginal cream Place 1 Applicatorful vaginally 3 (three) times a week. 42.5 g 3  . ezetimibe (ZETIA) 10 MG tablet Take 10 mg by mouth daily.    Rebecca Hodge gabapentin (NEURONTIN) 300 MG capsule Take 300 mg by mouth 3 (three) times daily.    Rebecca Hodge glimepiride (AMARYL) 1 MG tablet Take 1 mg by mouth daily before breakfast.    . insulin detemir (LEVEMIR) 100 UNIT/ML injection Inject 20 Units into the skin at bedtime.    . Liraglutide (VICTOZA) 18 MG/3ML SOPN Inject into the skin daily.    Rebecca Hodge lisinopril (PRINIVIL,ZESTRIL) 20 MG tablet Take 20 mg by mouth daily.    . metoprolol tartrate (LOPRESSOR) 25 MG tablet Take 25 mg by mouth 2 (two) times daily.    . nabumetone (RELAFEN) 500 MG tablet Take 500 mg by mouth 2 (two) times daily.    . pantoprazole (PROTONIX) 40 MG tablet Take 1 tablet (40 mg total) by mouth daily. 90 tablet 2  . rosuvastatin (CRESTOR) 20 MG tablet Take 20 mg by mouth daily.    Rebecca Hodge ipratropium (ATROVENT) 0.03 % nasal spray Place 2 sprays into both nostrils every 12 (twelve) hours. 30 mL 3   No facility-administered medications prior to visit.    Allergies  Allergen Reactions  . Codeine   . Morphine And Related   . Sulfa Antibiotics     ROS Review of Systems  Constitutional: Negative.   HENT: Negative.   Eyes: Negative.   Respiratory: Negative.   Cardiovascular: Negative.        Right chest wall pain  Gastrointestinal: Negative.   Endocrine: Negative.   Genitourinary: Negative.   Musculoskeletal: Negative.   Skin: Negative.   Neurological: Negative.   Hematological: Negative.   Psychiatric/Behavioral: Negative.       Objective:    Physical Exam  Constitutional: She appears well-developed and well-nourished.  HENT:  Head: Normocephalic and atraumatic.  Right Ear: External ear normal.  Left Ear: External ear normal.  Eyes: Pupils are equal,  round, and reactive to light. Conjunctivae are normal.  Cardiovascular: Normal rate, regular rhythm, normal heart sounds and intact distal pulses.  Pulmonary/Chest: Effort normal and breath sounds normal.  Right chest wall pain from fall  Abdominal: Soft. Bowel sounds are normal.  Musculoskeletal:        General: Normal range of motion.     Cervical back: Normal range of motion and neck supple.  Neurological: She has normal reflexes.  Skin: Skin is warm and dry.  Psychiatric: She has a normal mood and affect.  Vitals reviewed.   BP 118/80 (BP Location: Right Arm, Patient Position: Sitting)   Pulse 74   Temp (!) 97.5 F (36.4 C) (Temporal)   Resp 17  Ht 4' 11.45" (1.51 m)   Wt 140 lb 9.6 oz (63.8 kg)   BMI 27.97 kg/m  Wt Readings from Last 3 Encounters:  06/25/19 140 lb 9.6 oz (63.8 kg)  06/18/19 139 lb 6.4 oz (63.2 kg)  07/13/13 155 lb (70.3 kg)     Health Maintenance Due  Topic Date Due  . OPHTHALMOLOGY EXAM  Never done  . TETANUS/TDAP  Never done  . HEMOGLOBIN A1C  06/19/2019    There are no preventive care reminders to display for this patient.  No results found for: TSH Lab Results  Component Value Date   WBC 7.7 09/10/2008   HGB 13.6 09/10/2008   HCT 40.5 09/10/2008   MCV 87.4 09/10/2008   PLT 278 09/10/2008   Lab Results  Component Value Date   NA 139 09/10/2008   K 4.4 09/10/2008   CO2 29 09/10/2008   GLUCOSE 139 (H) 09/10/2008   BUN 9 09/10/2008   CREATININE 0.68 09/10/2008   CALCIUM 9.5 09/10/2008   No results found for: CHOL No results found for: HDL No results found for: LDLCALC No results found for: TRIG No results found for: CHOLHDL No results found for: HGBA1C    Assessment & Plan:   Problem List Items Addressed This Visit      Endocrine   Type 2 diabetes mellitus with other specified complication [facility])    An individual care plan for diabetes was established and reinforced today.  The patient's status was assessed using clinical  findings on exam, labs and diagnostic testing. Patient success at meeting goals based on disease specific evidence-based guidelines and found to be good controlled. Medications were assessed and patient's understanding of the medical issues , including barriers were assessed. Recommend adherence to a diabetic diet, a graduated exercise program, HgbA1c level is checked quarterly, and urine microalbumin performed yearly .  Annual mono-filament sensation testing performed. Lower blood pressure and control hyperlipidemia is important. Get annual eye exams and annual flu shots and smoking cessation discussed.  Self management goals were discussed.      Relevant Orders   POCT UA - Microalbumin (Completed)   CBC with Differential   Comprehensive metabolic panel     Musculoskeletal and Integument   Age-related osteoporosis without current pathological fracture    AN INDIVIDUAL CARE PLAN for osteoporosis was established and reinforced today.  The patient's status was assessed using clinical findings on exam, labs, and other diagnostic testing. Patient's success at meeting treatment goals based on disease specific evidence-bassed guidelines and found to be in good control. RECOMMENDATIONS include maintaining present medicines and treatment.        Other   Mixed incontinence    AN INDIVIDUAL CARE PLAN for incontinence was established and reinforced today.  The patient's status was assessed using clinical findings on exam, labs, and other diagnostic testing. Patient's success at meeting treatment goals based on disease specific evidence-bassed guidelines and found to be in good control. RECOMMENDATIONS include maintaining present medicines and treatment.      Long term (current) use of insulin ([place])    Patient doing well on insulin.  No hypoglycemia.      Relevant Orders   Hemoglobin A1c   Mixed hyperlipidemia - Primary    AN INDIVIDUAL CARE PLAN for hyperlipidemia/ cholesterol was established and  reinforced today.  The patient's status was assessed using clinical findings on exam, lab and other diagnostic tests. The patient's disease status was assessed based on evidence-based guidelines and found to be  well controlled. MEDICATIONS were reviewed. SELF MANAGEMENT GOALS have been discussed and patient's success at attaining the goal of low cholesterol was assessed. RECOMMENDATION given include regular exercise 3 days a week and low cholesterol/low fat diet. CLINICAL SUMMARY including written plan to identify barriers unique to the patient due to social or economic  reasons was discussed.      Relevant Orders   Lipid Panel   Rib pain    Patient still having rib pain but x-ray demonstrates no fractures.         No orders of the defined types were placed in this encounter.   Follow-up: Return in about 4 months (around 10/25/2019) for fasting.    Reinaldo Meeker, MD

## 2019-06-26 LAB — COMPREHENSIVE METABOLIC PANEL
ALT: 14 IU/L (ref 0–32)
AST: 22 IU/L (ref 0–40)
Albumin/Globulin Ratio: 2 (ref 1.2–2.2)
Albumin: 4.4 g/dL (ref 3.7–4.7)
Alkaline Phosphatase: 78 IU/L (ref 39–117)
BUN/Creatinine Ratio: 20 (ref 12–28)
BUN: 19 mg/dL (ref 8–27)
Bilirubin Total: 0.5 mg/dL (ref 0.0–1.2)
CO2: 26 mmol/L (ref 20–29)
Calcium: 9.8 mg/dL (ref 8.7–10.3)
Chloride: 104 mmol/L (ref 96–106)
Creatinine, Ser: 0.97 mg/dL (ref 0.57–1.00)
GFR calc Af Amer: 64 mL/min/{1.73_m2} (ref >59)
GFR calc non Af Amer: 55 mL/min/{1.73_m2} — ABNORMAL LOW (ref >59)
Globulin, Total: 2.2 g/dL (ref 1.5–4.5)
Glucose: 139 mg/dL — ABNORMAL HIGH (ref 65–99)
Potassium: 5.4 mmol/L — ABNORMAL HIGH (ref 3.5–5.2)
Sodium: 142 mmol/L (ref 134–144)
Total Protein: 6.6 g/dL (ref 6.0–8.5)

## 2019-06-26 LAB — CBC WITH DIFFERENTIAL/PLATELET
Basophils Absolute: 0.1 10*3/uL (ref 0.0–0.2)
Basos: 1 %
EOS (ABSOLUTE): 0.2 10*3/uL (ref 0.0–0.4)
Eos: 3 %
Hematocrit: 44.4 % (ref 34.0–46.6)
Hemoglobin: 14.3 g/dL (ref 11.1–15.9)
Immature Grans (Abs): 0 10*3/uL (ref 0.0–0.1)
Immature Granulocytes: 0 %
Lymphocytes Absolute: 2.7 10*3/uL (ref 0.7–3.1)
Lymphs: 42 %
MCH: 27.9 pg (ref 26.6–33.0)
MCHC: 32.2 g/dL (ref 31.5–35.7)
MCV: 87 fL (ref 79–97)
Monocytes Absolute: 0.6 10*3/uL (ref 0.1–0.9)
Monocytes: 9 %
Neutrophils Absolute: 2.9 10*3/uL (ref 1.4–7.0)
Neutrophils: 45 %
Platelets: 249 10*3/uL (ref 150–450)
RBC: 5.12 x10E6/uL (ref 3.77–5.28)
RDW: 14.5 % (ref 11.7–15.4)
WBC: 6.5 10*3/uL (ref 3.4–10.8)

## 2019-06-26 LAB — HEMOGLOBIN A1C
Est. average glucose Bld gHb Est-mCnc: 163 mg/dL
Hgb A1c MFr Bld: 7.3 % — ABNORMAL HIGH (ref 4.8–5.6)

## 2019-06-26 LAB — LIPID PANEL
Chol/HDL Ratio: 2.5 ratio (ref 0.0–4.4)
Cholesterol, Total: 162 mg/dL (ref 100–199)
HDL: 65 mg/dL (ref >39)
LDL Chol Calc (NIH): 80 mg/dL (ref 0–99)
Triglycerides: 91 mg/dL (ref 0–149)
VLDL Cholesterol Cal: 17 mg/dL (ref 5–40)

## 2019-06-26 LAB — CARDIOVASCULAR RISK ASSESSMENT

## 2019-06-26 NOTE — Progress Notes (Signed)
Cbc normal, glucose 139, kidney tests slightly high, potassium 5.7 high- recheck one week, cholesterol normalA 1c 7.3 OK lp

## 2019-07-16 DIAGNOSIS — R32 Unspecified urinary incontinence: Secondary | ICD-10-CM | POA: Diagnosis not present

## 2019-07-20 ENCOUNTER — Other Ambulatory Visit: Payer: Self-pay | Admitting: Legal Medicine

## 2019-08-14 DIAGNOSIS — R32 Unspecified urinary incontinence: Secondary | ICD-10-CM | POA: Diagnosis not present

## 2019-08-15 ENCOUNTER — Other Ambulatory Visit: Payer: Self-pay

## 2019-08-15 ENCOUNTER — Other Ambulatory Visit: Payer: Self-pay | Admitting: Legal Medicine

## 2019-08-15 MED ORDER — GLIMEPIRIDE 1 MG PO TABS: 1.0000 mg | ORAL_TABLET | Freq: Every day | ORAL | 2 refills | Status: DC

## 2019-08-15 MED ORDER — GABAPENTIN 300 MG PO CAPS: 300.0000 mg | ORAL_CAPSULE | Freq: Three times a day (TID) | ORAL | 2 refills | Status: DC

## 2019-08-15 MED ORDER — METOPROLOL TARTRATE 25 MG PO TABS: 25.0000 mg | ORAL_TABLET | Freq: Two times a day (BID) | ORAL | 0 refills | Status: DC

## 2019-08-15 MED ORDER — EZETIMIBE 10 MG PO TABS: 10.0000 mg | ORAL_TABLET | Freq: Every day | ORAL | 0 refills | Status: DC

## 2019-08-15 MED ORDER — VICTOZA 18 MG/3ML ~~LOC~~ SOPN: 1.2000 mg | PEN_INJECTOR | Freq: Every day | SUBCUTANEOUS | 6 refills | Status: DC

## 2019-08-15 MED ORDER — FLUTICASONE PROPIONATE 50 MCG/ACT NA SUSP: 1.0000 | Freq: Every day | NASAL | 6 refills | Status: DC

## 2019-08-17 ENCOUNTER — Ambulatory Visit (INDEPENDENT_AMBULATORY_CARE_PROVIDER_SITE_OTHER): Payer: Medicare HMO | Admitting: Legal Medicine

## 2019-08-17 ENCOUNTER — Other Ambulatory Visit: Payer: Self-pay

## 2019-08-17 ENCOUNTER — Encounter: Payer: Self-pay | Admitting: Legal Medicine

## 2019-08-17 VITALS — BP 120/78 | HR 69 | Temp 97.4°F | Resp 16 | Ht 63.0 in | Wt 142.2 lb

## 2019-08-17 DIAGNOSIS — E876 Hypokalemia: Secondary | ICD-10-CM | POA: Diagnosis not present

## 2019-08-17 DIAGNOSIS — Z6825 Body mass index (BMI) 25.0-25.9, adult: Secondary | ICD-10-CM

## 2019-08-17 DIAGNOSIS — N952 Postmenopausal atrophic vaginitis: Secondary | ICD-10-CM | POA: Diagnosis not present

## 2019-08-17 DIAGNOSIS — R109 Unspecified abdominal pain: Secondary | ICD-10-CM

## 2019-08-17 DIAGNOSIS — Z6827 Body mass index (BMI) 27.0-27.9, adult: Secondary | ICD-10-CM | POA: Insufficient documentation

## 2019-08-17 DIAGNOSIS — R140 Abdominal distension (gaseous): Secondary | ICD-10-CM | POA: Diagnosis not present

## 2019-08-17 DIAGNOSIS — R1084 Generalized abdominal pain: Secondary | ICD-10-CM | POA: Diagnosis not present

## 2019-08-17 DIAGNOSIS — R1032 Left lower quadrant pain: Secondary | ICD-10-CM | POA: Diagnosis not present

## 2019-08-17 HISTORY — DX: Body mass index (BMI) 27.0-27.9, adult: Z68.27

## 2019-08-17 LAB — COMPREHENSIVE METABOLIC PANEL
ALT: 24 IU/L (ref 0–32)
AST: 35 IU/L (ref 0–40)
Albumin/Globulin Ratio: 1.7 (ref 1.2–2.2)
Albumin: 4.2 g/dL (ref 3.7–4.7)
Alkaline Phosphatase: 65 IU/L (ref 48–121)
BUN/Creatinine Ratio: 27 (ref 12–28)
BUN: 23 mg/dL (ref 8–27)
Bilirubin Total: 0.5 mg/dL (ref 0.0–1.2)
CO2: 24 mmol/L (ref 20–29)
Calcium: 9.1 mg/dL (ref 8.7–10.3)
Chloride: 105 mmol/L (ref 96–106)
Creatinine, Ser: 0.85 mg/dL (ref 0.57–1.00)
GFR calc Af Amer: 75 mL/min/{1.73_m2} (ref >59)
GFR calc non Af Amer: 65 mL/min/{1.73_m2} (ref >59)
Globulin, Total: 2.5 g/dL (ref 1.5–4.5)
Glucose: 104 mg/dL — ABNORMAL HIGH (ref 65–99)
Potassium: 5 mmol/L (ref 3.5–5.2)
Sodium: 143 mmol/L (ref 134–144)
Total Protein: 6.7 g/dL (ref 6.0–8.5)

## 2019-08-17 LAB — POCT URINALYSIS DIPSTICK
Bilirubin, UA: NEGATIVE
Blood, UA: NEGATIVE
Glucose, UA: NEGATIVE
Ketones, UA: NEGATIVE
Leukocytes, UA: NEGATIVE
Nitrite, UA: NEGATIVE
Protein, UA: NEGATIVE
Spec Grav, UA: 1.025 (ref 1.010–1.025)
Urobilinogen, UA: 0.2 E.U./dL
pH, UA: 5.5 (ref 5.0–8.0)

## 2019-08-17 MED ORDER — ESTRADIOL 0.1 MG/GM VA CREA: 1.0000 | TOPICAL_CREAM | VAGINAL | 3 refills | Status: DC

## 2019-08-17 MED ORDER — BENZONATATE 100 MG PO CAPS
100.0000 mg | ORAL_CAPSULE | Freq: Two times a day (BID) | ORAL | 2 refills | Status: DC | PRN
Start: 2019-08-17 — End: 2020-03-03

## 2019-08-17 NOTE — Progress Notes (Signed)
Established Patient Office Visit  Subjective:  Patient ID: Rebecca Hodge, female    DOB: 08-17-1940  Age: 79 y.o. MRN: [id number]  CC:  Chief Complaint  Patient presents with  . Urinary Tract Infection    HPI Rebecca Hodge presents for follow up, urine is clear but still having abdominal pain suprapubic and epigastric. Increased with eating  Past Medical History:  Diagnosis Date  . Age-related osteoporosis without current pathological fracture 06/18/2019  . Allergic rhinitis   . Diabetes ([place])   . Dysphasia following cerebral infarction 06/18/2019  . Hyperlipidemia   . Hypertension   . Mixed hyperlipidemia 06/18/2019  . Mixed incontinence 06/18/2019  . Sequelae of poliomyelitis 06/18/2019  . Type 2 diabetes mellitus with other specified complication ([place]) 7/0/6237    Past Surgical History:  Procedure Laterality Date  . ABDOMINAL HYSTERECTOMY    . CATARACT EXTRACTION Right   . CHOLECYSTECTOMY    . ROTATOR CUFF REPAIR Bilateral     History reviewed. No pertinent family history.  Social History   Socioeconomic History  . Marital status: Widowed    Spouse name: Not on file  . Number of children: Not on file  . Years of education: Not on file  . Highest education level: Not on file  Occupational History  . Occupation: Disabled  Tobacco Use  . Smoking status: Former Smoker    Packs/day: 1.00    Years: 5.00    Pack years: 5.00    Types: Cigarettes    Quit date: 03/16/1987    Years since quitting: 32.4  . Smokeless tobacco: Never Used  Substance and Sexual Activity  . Alcohol use: No  . Drug use: No  . Sexual activity: Not Currently  Other Topics Concern  . Not on file  Social History Narrative  . Not on file   Social Determinants of Health   Financial Resource Strain:   . Difficulty of Paying Living Expenses:   Food Insecurity:   . Worried About Charity fundraiser in the Last Year:   . Arboriculturist in the Last Year:   Transportation Needs:   . Lexicographer (Medical):   Marland Kitchen Lack of Transportation (Non-Medical):   Physical Activity:   . Days of Exercise per Week:   . Minutes of Exercise per Session:   Stress:   . Feeling of Stress :   Social Connections:   . Frequency of Communication with Friends and Family:   . Frequency of Social Gatherings with Friends and Family:   . Attends Religious Services:   . Active Member of Clubs or Organizations:   . Attends Archivist Meetings:   Marland Kitchen Marital Status:   Intimate Partner Violence:   . Fear of Current or Ex-Partner:   . Emotionally Abused:   Marland Kitchen Physically Abused:   . Sexually Abused:     Outpatient Medications Prior to Visit  Medication Sig Dispense Refill  . alendronate (FOSAMAX) 70 MG tablet Take 70 mg by mouth once a week.    . BD PEN NEEDLE NANO U/F 32G X 4 MM MISC See admin instructions.    Marland Kitchen esomeprazole (NEXIUM) 40 MG capsule Take 40 mg by mouth daily before breakfast.    . ezetimibe (ZETIA) 10 MG tablet Take 1 tablet (10 mg total) by mouth daily. 90 tablet 0  . fluticasone (FLONASE) 50 MCG/ACT nasal spray Place 1 spray into both nostrils daily. 16 mL 6  . gabapentin (NEURONTIN) 300 MG  capsule Take 1 capsule (300 mg total) by mouth 3 (three) times daily. 270 capsule 2  . glimepiride (AMARYL) 1 MG tablet Take 1 tablet (1 mg total) by mouth daily before breakfast. 90 tablet 2  . insulin detemir (LEVEMIR) 100 UNIT/ML injection Inject 20 Units into the skin at bedtime.    . liraglutide (VICTOZA) 18 MG/3ML SOPN Inject 0.2 mLs (1.2 mg total) into the skin daily. 18 mL 6  . lisinopril (PRINIVIL,ZESTRIL) 20 MG tablet Take 20 mg by mouth daily.    . metoprolol tartrate (LOPRESSOR) 25 MG tablet Take 1 tablet (25 mg total) by mouth 2 (two) times daily. 180 tablet 0  . nabumetone (RELAFEN) 500 MG tablet Take 500 mg by mouth 2 (two) times daily.    . pantoprazole (PROTONIX) 40 MG tablet Take 1 tablet (40 mg total) by mouth daily. 90 tablet 2  . rosuvastatin (CRESTOR) 40 MG  tablet TAKE 1 TABLET BY MOUTH EVERY NIGHT AT BEDTIME 30 tablet 6  . estradiol (ESTRACE) 0.1 MG/GM vaginal cream Place 1 Applicatorful vaginally 3 (three) times a week. 42.5 g 3  . metoprolol succinate (TOPROL-XL) 25 MG 24 hr tablet     . rosuvastatin (CRESTOR) 20 MG tablet Take 20 mg by mouth daily.     No facility-administered medications prior to visit.    Allergies  Allergen Reactions  . Codeine   . Morphine And Related   . Sulfa Antibiotics     ROS Review of Systems  Constitutional: Negative.   HENT: Negative.   Eyes: Negative.   Respiratory: Negative.   Cardiovascular: Negative.   Gastrointestinal: Negative.   Endocrine: Negative.   Genitourinary: Negative.   Neurological: Negative.       Objective:    Physical Exam  Constitutional: She appears well-developed and well-nourished.  HENT:  Head: Normocephalic and atraumatic.  Right Ear: External ear normal.  Left Ear: External ear normal.  Nose: Nose normal.  Mouth/Throat: Oropharynx is clear and moist.  Eyes: Conjunctivae and EOM are normal.  Cardiovascular: Normal rate, regular rhythm, normal heart sounds and intact distal pulses.  Pulmonary/Chest: Effort normal and breath sounds normal.  Abdominal: There is abdominal tenderness (epigastric and suprapubic).  Vitals reviewed.   BP 120/78   Pulse 69   Temp (!) 97.4 F (36.3 C)   Resp 16   Ht 5\' 3"  (1.6 m)   Wt 142 lb 3.2 oz (64.5 kg)   SpO2 96%   BMI 25.19 kg/m  Wt Readings from Last 3 Encounters:  08/17/19 142 lb 3.2 oz (64.5 kg)  06/25/19 140 lb 9.6 oz (63.8 kg)  06/18/19 139 lb 6.4 oz (63.2 kg)     Health Maintenance Due  Topic Date Due  . OPHTHALMOLOGY EXAM  Never done  . COVID-19 Vaccine (1) Never done  . TETANUS/TDAP  Never done    There are no preventive care reminders to display for this patient.  No results found for: TSH Lab Results  Component Value Date   WBC 6.5 06/25/2019   HGB 14.3 06/25/2019   HCT 44.4 06/25/2019   MCV 87  06/25/2019   PLT 249 06/25/2019   Lab Results  Component Value Date   NA 142 06/25/2019   K 5.4 (H) 06/25/2019   CO2 26 06/25/2019   GLUCOSE 139 (H) 06/25/2019   BUN 19 06/25/2019   CREATININE 0.97 06/25/2019   BILITOT 0.5 06/25/2019   ALKPHOS 78 06/25/2019   AST 22 06/25/2019   ALT 14 06/25/2019  PROT 6.6 06/25/2019   ALBUMIN 4.4 06/25/2019   CALCIUM 9.8 06/25/2019   Lab Results  Component Value Date   CHOL 162 06/25/2019   Lab Results  Component Value Date   HDL 65 06/25/2019   Lab Results  Component Value Date   LDLCALC 80 06/25/2019   Lab Results  Component Value Date   TRIG 91 06/25/2019   Lab Results  Component Value Date   CHOLHDL 2.5 06/25/2019   Lab Results  Component Value Date   HGBA1C 7.3 (H) 06/25/2019      Assessment & Plan:   Hypertension: An individual hypertension care plan was established and reinforced today.  The patient's status was assessed using clinical findings on exam and labs or diagnostic tests. The patient's success at meeting treatment goals on disease specific evidence-based guidelines and found to be well controlled. SELF MANAGEMENT: The patient and I together assessed ways to personally work towards obtaining the recommended goals. RECOMMENDATIONS: avoid decongestants found in common cold remedies, decrease consumption of alcohol, perform routine monitoring of BP with home BP cuff, exercise, reduction of dietary salt, take medicines as prescribed, try not to miss doses and quit smoking.  Regular exercise and maintaining a healthy weight is needed.  Stress reduction may help. A CLINICAL SUMMARY including written plan identify barriers to care unique to individual due to social or financial issues.  We attempt to mutually creat solutions for individual and family understanding.  Diabetes with hypertension and hyperlipidemia: AN INDIVIDUAL CARE PLAN for hyperlipidemia/ cholesterol was established and reinforced today.  The patient's  status was assessed using clinical findings on exam, lab and other diagnostic tests. The patient's disease status was assessed based on evidence-based guidelines and found to be well controlled. MEDICATIONS were reviewed. SELF MANAGEMENT GOALS have been discussed and patient's success at attaining the goal of low cholesterol was assessed. RECOMMENDATION given include regular exercise 3 days a week and low cholesterol/low fat diet. CLINICAL SUMMARY including written plan to identify barriers unique to the patient due to social or economic  reasons was discussed.  Sequella of Polio: PATIENT still wears brace on right leg to walk.  Marland KitchenlHyperlipidemia: AN INDIVIDUAL CARE PLAN for hyperlipidemia/ cholesterol was established and reinforced today.  The patient's status was assessed using clinical findings on exam, lab and other diagnostic tests. The patient's disease status was assessed based on evidence-based guidelines and found to be well controlled. MEDICATIONS were reviewed. SELF MANAGEMENT GOALS have been discussed and patient's success at attaining the goal of low cholesterol was assessed. RECOMMENDATION given include regular exercise 3 days a week and low cholesterol/low fat diet. CLINICAL SUMMARY including written plan to identify barriers unique to the patient due to social or economic  reasons was discussed.  Long term use of insulin: AN INDIVIDUAL CARE PLAN was established and reinforced today.  The patient's status was assessed using clinical findings on exam, labs, and other diagnostic testing. Patient's success at meeting treatment goals based on disease specific evidence-bassed guidelines and found to be in good control. RECOMMENDATIONS include maintaining present medicines and treatment. No hypoglycemia.  Mixed incontinence: AN INDIVIDUAL CARE PLAN was established and reinforced today.  The patient's status was assessed using clinical findings on exam, labs, and other diagnostic testing.  Patient's success at meeting treatment goals based on disease specific evidence-bassed guidelines and found to be in good control. RECOMMENDATIONS include maintaining present medicines and treatment. She still wears diapers.  BMI 25: Continue diet and exercise at present level.  Follow-up: Return in about 3 weeks (around 09/07/2019) for follow up abdominal pain.    Reinaldo Meeker, MD

## 2019-08-19 NOTE — Progress Notes (Signed)
Glucose 104, potasium normal,  lp

## 2019-08-28 DIAGNOSIS — H524 Presbyopia: Secondary | ICD-10-CM | POA: Diagnosis not present

## 2019-08-28 DIAGNOSIS — Z794 Long term (current) use of insulin: Secondary | ICD-10-CM | POA: Diagnosis not present

## 2019-08-28 DIAGNOSIS — E119 Type 2 diabetes mellitus without complications: Secondary | ICD-10-CM | POA: Diagnosis not present

## 2019-08-28 DIAGNOSIS — Z961 Presence of intraocular lens: Secondary | ICD-10-CM | POA: Diagnosis not present

## 2019-09-13 ENCOUNTER — Other Ambulatory Visit: Payer: Self-pay

## 2019-09-13 MED ORDER — FLUTICASONE PROPIONATE 50 MCG/ACT NA SUSP: 1.0000 | Freq: Every day | NASAL | 6 refills | Status: DC

## 2019-09-13 MED ORDER — GABAPENTIN 300 MG PO CAPS: 300.0000 mg | ORAL_CAPSULE | Freq: Three times a day (TID) | ORAL | 2 refills | Status: DC

## 2019-09-13 MED ORDER — EZETIMIBE 10 MG PO TABS: 10.0000 mg | ORAL_TABLET | Freq: Every day | ORAL | 2 refills | Status: DC

## 2019-09-13 MED ORDER — LANSOPRAZOLE 30 MG PO CPDR: 30.0000 mg | DELAYED_RELEASE_CAPSULE | Freq: Every day | ORAL | 2 refills | Status: DC

## 2019-09-14 DIAGNOSIS — R32 Unspecified urinary incontinence: Secondary | ICD-10-CM | POA: Diagnosis not present

## 2019-10-01 ENCOUNTER — Encounter: Payer: Self-pay | Admitting: Legal Medicine

## 2019-10-01 ENCOUNTER — Other Ambulatory Visit: Payer: Self-pay

## 2019-10-01 ENCOUNTER — Ambulatory Visit (INDEPENDENT_AMBULATORY_CARE_PROVIDER_SITE_OTHER): Payer: Medicare HMO | Admitting: Legal Medicine

## 2019-10-01 VITALS — BP 122/84 | HR 95 | Temp 97.3°F | Resp 16 | Ht 64.0 in | Wt 142.2 lb

## 2019-10-01 DIAGNOSIS — Z794 Long term (current) use of insulin: Secondary | ICD-10-CM | POA: Diagnosis not present

## 2019-10-01 DIAGNOSIS — R319 Hematuria, unspecified: Secondary | ICD-10-CM | POA: Diagnosis not present

## 2019-10-01 DIAGNOSIS — E1169 Type 2 diabetes mellitus with other specified complication: Secondary | ICD-10-CM

## 2019-10-01 DIAGNOSIS — J0100 Acute maxillary sinusitis, unspecified: Secondary | ICD-10-CM | POA: Diagnosis not present

## 2019-10-01 LAB — POCT URINALYSIS DIPSTICK
Bilirubin, UA: NEGATIVE
Glucose, UA: NEGATIVE
Ketones, UA: NEGATIVE
Leukocytes, UA: NEGATIVE
Nitrite, UA: NEGATIVE
Protein, UA: POSITIVE — AB
Spec Grav, UA: >=1.03 — AB (ref 1.010–1.025)
Urobilinogen, UA: 0.2 E.U./dL
pH, UA: 5.5 (ref 5.0–8.0)

## 2019-10-01 MED ORDER — CEPHALEXIN 500 MG PO CAPS: 500.0000 mg | ORAL_CAPSULE | Freq: Four times a day (QID) | ORAL | 0 refills | Status: DC

## 2019-10-01 NOTE — Progress Notes (Addendum)
Subjective:  Patient ID: Rebecca Hodge, female    DOB: February 02, 1941  Age: 79 y.o. MRN: [id number]  Chief Complaint  Patient presents with  . Sinusitis  . Urinary Tract Infection  . Generalized Body Aches    HPI: patient presents with abdominal pain and dysuria and frequency for one week.  She has sinus congestion and pain for one week.  No fever or chills.   Current Outpatient Medications on File Prior to Visit  Medication Sig Dispense Refill  . alendronate (FOSAMAX) 70 MG tablet Take 70 mg by mouth once a week.    Marland Kitchen azelastine (ASTELIN) 0.1 % nasal spray     . BD PEN NEEDLE NANO U/F 32G X 4 MM MISC See admin instructions.    . benzonatate (TESSALON) 100 MG capsule Take 1 capsule (100 mg total) by mouth 2 (two) times daily as needed for cough. 20 capsule 2  . esomeprazole (NEXIUM) 40 MG capsule Take 40 mg by mouth daily before breakfast.    . estradiol (ESTRACE) 0.1 MG/GM vaginal cream Place 1 Applicatorful vaginally 3 (three) times a week. 42.5 g 3  . ezetimibe (ZETIA) 10 MG tablet Take 1 tablet (10 mg total) by mouth daily. 90 tablet 2  . fluticasone (FLONASE) 50 MCG/ACT nasal spray Place 1 spray into both nostrils daily. 16 mL 6  . gabapentin (NEURONTIN) 300 MG capsule Take 1 capsule (300 mg total) by mouth 3 (three) times daily. 270 capsule 2  . glimepiride (AMARYL) 1 MG tablet Take 1 tablet (1 mg total) by mouth daily before breakfast. 90 tablet 2  . insulin detemir (LEVEMIR) 100 UNIT/ML injection Inject 20 Units into the skin at bedtime.    . lansoprazole (PREVACID) 30 MG capsule Take 1 capsule (30 mg total) by mouth daily at 12 noon. 90 capsule 2  . liraglutide (VICTOZA) 18 MG/3ML SOPN Inject 0.2 mLs (1.2 mg total) into the skin daily. 18 mL 6  . lisinopril (PRINIVIL,ZESTRIL) 20 MG tablet Take 20 mg by mouth daily.    . metoprolol succinate (TOPROL-XL) 25 MG 24 hr tablet     . metoprolol tartrate (LOPRESSOR) 25 MG tablet Take 1 tablet (25 mg total) by mouth 2 (two) times daily.  180 tablet 0  . nabumetone (RELAFEN) 500 MG tablet Take 500 mg by mouth 2 (two) times daily.    . pantoprazole (PROTONIX) 40 MG tablet Take 1 tablet (40 mg total) by mouth daily. 90 tablet 2  . rosuvastatin (CRESTOR) 40 MG tablet TAKE 1 TABLET BY MOUTH EVERY NIGHT AT BEDTIME 30 tablet 6   No current facility-administered medications on file prior to visit.   Past Medical History:  Diagnosis Date  . Age-related osteoporosis without current pathological fracture 06/18/2019  . Allergic rhinitis   . Diabetes ([place])   . Dysphasia following cerebral infarction 06/18/2019  . Hyperlipidemia   . Hypertension   . Mixed hyperlipidemia 06/18/2019  . Mixed incontinence 06/18/2019  . Sequelae of poliomyelitis 06/18/2019  . Type 2 diabetes mellitus with other specified complication ([place]) 03/21/4942   Past Surgical History:  Procedure Laterality Date  . ABDOMINAL HYSTERECTOMY    . CATARACT EXTRACTION Right   . CHOLECYSTECTOMY    . ROTATOR CUFF REPAIR Bilateral     History reviewed. No pertinent family history. Social History   Socioeconomic History  . Marital status: Widowed    Spouse name: Not on file  . Number of children: Not on file  . Years of education: Not on file  .  Highest education level: Not on file  Occupational History  . Occupation: Disabled  Tobacco Use  . Smoking status: Former Smoker    Packs/day: 1.00    Years: 5.00    Pack years: 5.00    Types: Cigarettes    Quit date: 03/16/1987    Years since quitting: 32.5  . Smokeless tobacco: Never Used  Substance and Sexual Activity  . Alcohol use: No  . Drug use: No  . Sexual activity: Not Currently  Other Topics Concern  . Not on file  Social History Narrative  . Not on file   Social Determinants of Health   Financial Resource Strain:   . Difficulty of Paying Living Expenses:   Food Insecurity:   . Worried About Charity fundraiser in the Last Year:   . Arboriculturist in the Last Year:   Transportation Needs:   . Lexicographer (Medical):   Marland Kitchen Lack of Transportation (Non-Medical):   Physical Activity:   . Days of Exercise per Week:   . Minutes of Exercise per Session:   Stress:   . Feeling of Stress :   Social Connections:   . Frequency of Communication with Friends and Family:   . Frequency of Social Gatherings with Friends and Family:   . Attends Religious Services:   . Active Member of Clubs or Organizations:   . Attends Archivist Meetings:   Marland Kitchen Marital Status:     Review of Systems  Constitutional: Negative.   HENT: Positive for sinus pressure and sinus pain.   Eyes: Negative.   Respiratory: Negative.   Cardiovascular: Negative.   Gastrointestinal: Positive for abdominal pain.  Genitourinary: Positive for dysuria and frequency.  Musculoskeletal: Negative.   Neurological: Negative.   Psychiatric/Behavioral: Negative.      Objective:  BP 122/84   Pulse 95   Temp (!) 97.3 F (36.3 C)   Resp 16   Ht 5\' 4"  (1.626 m)   Wt 142 lb 3.2 oz (64.5 kg)   SpO2 95%   BMI 24.41 kg/m   BP/Weight 10/01/2019 08/17/2019 5/37/4827  Systolic BP 078 675 449  Diastolic BP 84 78 80  Wt. (Lbs) 142.2 142.2 140.6  BMI 24.41 25.19 27.97    Physical Exam Vitals reviewed.  Constitutional:      Appearance: Normal appearance.  HENT:     Head: Atraumatic.     Right Ear: Tympanic membrane, ear canal and external ear normal.     Left Ear: Tympanic membrane, ear canal and external ear normal.     Nose: Nose normal.     Mouth/Throat:     Mouth: Mucous membranes are moist.  Cardiovascular:     Rate and Rhythm: Normal rate and regular rhythm.     Pulses: Normal pulses.     Heart sounds: Normal heart sounds.  Pulmonary:     Effort: Pulmonary effort is normal.     Breath sounds: Normal breath sounds.  Abdominal:     Tenderness: There is abdominal tenderness.  Musculoskeletal:        General: Normal range of motion.     Cervical back: Normal range of motion and neck supple.  Skin:     General: Skin is warm.  Neurological:     General: No focal deficit present.     Mental Status: She is alert and oriented to person, place, and time.  Psychiatric:        Mood and Affect: Mood normal.  Diabetic Foot Exam - Simple   No data filed       Lab Results  Component Value Date   WBC 6.5 06/25/2019   HGB 14.3 06/25/2019   HCT 44.4 06/25/2019   PLT 249 06/25/2019   GLUCOSE 104 (H) 08/17/2019   CHOL 162 06/25/2019   TRIG 91 06/25/2019   HDL 65 06/25/2019   LDLCALC 80 06/25/2019   ALT 24 08/17/2019   AST 35 08/17/2019   NA 143 08/17/2019   K 5.0 08/17/2019   CL 105 08/17/2019   CREATININE 0.85 08/17/2019   BUN 23 08/17/2019   CO2 24 08/17/2019   HGBA1C 7.3 (H) 06/25/2019   MICROALBUR 30 06/25/2019      Assessment & Plan:   1. Hematuria, unspecified type - POCT urinalysis dipstick Treat with keflex 500mg  Qid   Patient has mixed incontinence with the need for adult diapers for the incontinence.  The diapers and supplies are needed to maintain present condition and to prevent further deterioration of condition  Meds ordered this encounter  Medications  . cephALEXin (KEFLEX) 500 MG capsule    Sig: Take 1 capsule (500 mg total) by mouth 4 (four) times daily.    Dispense:  40 capsule    Refill:  0    Orders Placed This Encounter  Procedures  . Urine Culture  . POCT urinalysis dipstick     Follow-up: Return in about 3 months (around 01/01/2020) for fasting chronic.  An After Visit Summary was printed and given to the patient.  [place] [phone]

## 2019-10-02 ENCOUNTER — Other Ambulatory Visit: Payer: Self-pay

## 2019-10-02 ENCOUNTER — Telehealth: Payer: Self-pay

## 2019-10-02 MED ORDER — FREESTYLE LIBRE 2 SENSOR MISC: 1.0000 | 6 refills | Status: DC

## 2019-10-02 MED ORDER — FREESTYLE LIBRE 2 READER DEVI: 1.0000 | Freq: Three times a day (TID) | 0 refills | Status: DC

## 2019-10-02 NOTE — Telephone Encounter (Signed)
Patient called and checked with pharmacy, she would like a rx for a dexcom g6 or a freestyle libre 2

## 2019-10-02 NOTE — Addendum Note (Signed)
Addended by: Reinaldo Meeker on: 10/02/2019 09:43 AM   Modules accepted: Orders

## 2019-10-03 LAB — URINE CULTURE

## 2019-10-03 NOTE — Progress Notes (Signed)
Mixed flora on culture lp

## 2019-10-09 ENCOUNTER — Other Ambulatory Visit: Payer: Self-pay | Admitting: Legal Medicine

## 2019-10-10 ENCOUNTER — Other Ambulatory Visit: Payer: Self-pay | Admitting: Legal Medicine

## 2019-10-14 DIAGNOSIS — R32 Unspecified urinary incontinence: Secondary | ICD-10-CM | POA: Diagnosis not present

## 2019-10-15 ENCOUNTER — Telehealth: Payer: Self-pay

## 2019-10-15 ENCOUNTER — Other Ambulatory Visit: Payer: Self-pay | Admitting: Legal Medicine

## 2019-10-15 MED ORDER — DOXYCYCLINE HYCLATE 100 MG PO TABS: 100.0000 mg | ORAL_TABLET | Freq: Two times a day (BID) | ORAL | 0 refills | Status: DC

## 2019-10-15 NOTE — Telephone Encounter (Signed)
Patient states she has finished antibiotic buy is still having symptoms

## 2019-11-09 ENCOUNTER — Other Ambulatory Visit: Payer: Self-pay | Admitting: Legal Medicine

## 2019-11-14 DIAGNOSIS — R32 Unspecified urinary incontinence: Secondary | ICD-10-CM | POA: Diagnosis not present

## 2019-11-16 ENCOUNTER — Ambulatory Visit (INDEPENDENT_AMBULATORY_CARE_PROVIDER_SITE_OTHER): Payer: Medicare HMO | Admitting: Legal Medicine

## 2019-11-16 ENCOUNTER — Other Ambulatory Visit: Payer: Self-pay

## 2019-11-16 ENCOUNTER — Encounter: Payer: Self-pay | Admitting: Legal Medicine

## 2019-11-16 VITALS — BP 126/80 | HR 60 | Temp 97.0°F | Resp 16 | Ht 64.0 in | Wt 145.0 lb

## 2019-11-16 DIAGNOSIS — R1084 Generalized abdominal pain: Secondary | ICD-10-CM

## 2019-11-16 DIAGNOSIS — N390 Urinary tract infection, site not specified: Secondary | ICD-10-CM

## 2019-11-16 MED ORDER — NITROFURANTOIN MACROCRYSTAL 50 MG PO CAPS: 50.0000 mg | ORAL_CAPSULE | Freq: Every day | ORAL | 2 refills | Status: DC

## 2019-11-16 NOTE — Progress Notes (Signed)
Subjective:  Patient ID: Rebecca Hodge, female    DOB: 1940-10-05  Age: 79 y.o. MRN: [id number]  Chief Complaint  Patient presents with  . Abdominal Pain    Spicy foods makes it worse    HPI: follow up on abdominal pain.  She has improved lower abdominal pain on antibiotics.  She may need chronic suppression. She has been having recurrent cystitis with lower abdominal pain.  She does not want to see urology.   Current Outpatient Medications on File Prior to Visit  Medication Sig Dispense Refill  . alendronate (FOSAMAX) 70 MG tablet TAKE 1 TABLET BY MOUTH WEEKLY ON THE SAME DAY EACH WEEK, WITH A FULL GLASS OF WATER AT LEAST 30 MINUTES BEFORE FIRST FOOD OR BEVERAGE OF THE DAY. REMAIN UPRIGHT FOR AT LEAST 30 MINUTES AFTER TAKING 4 tablet 6  . azelastine (ASTELIN) 0.1 % nasal spray     . BD PEN NEEDLE NANO U/F 32G X 4 MM MISC See admin instructions.    . benzonatate (TESSALON) 100 MG capsule Take 1 capsule (100 mg total) by mouth 2 (two) times daily as needed for cough. 20 capsule 2  . cephALEXin (KEFLEX) 500 MG capsule Take 1 capsule (500 mg total) by mouth 4 (four) times daily. 40 capsule 0  . Continuous Blood Gluc Receiver (FREESTYLE LIBRE 2 READER) DEVI 1 each by Does not apply route in the morning, at noon, and at bedtime. 1 each 0  . Continuous Blood Gluc Sensor (FREESTYLE LIBRE 2 SENSOR) MISC 1 each by Does not apply route every 14 (fourteen) days. 2 each 6  . doxycycline (VIBRA-TABS) 100 MG tablet Take 1 tablet (100 mg total) by mouth 2 (two) times daily. 20 tablet 0  . esomeprazole (NEXIUM) 40 MG capsule Take 40 mg by mouth daily before breakfast.    . estradiol (ESTRACE) 0.1 MG/GM vaginal cream Place 1 Applicatorful vaginally 3 (three) times a week. 42.5 g 3  . ezetimibe (ZETIA) 10 MG tablet Take 1 tablet (10 mg total) by mouth daily. 90 tablet 2  . fluticasone (FLONASE) 50 MCG/ACT nasal spray Place 1 spray into both nostrils daily. 16 mL 6  . gabapentin (NEURONTIN) 300 MG  capsule Take 1 capsule (300 mg total) by mouth 3 (three) times daily. 270 capsule 2  . glimepiride (AMARYL) 1 MG tablet Take 1 tablet (1 mg total) by mouth daily before breakfast. 90 tablet 2  . insulin detemir (LEVEMIR) 100 UNIT/ML injection Inject 20 Units into the skin at bedtime.    . lansoprazole (PREVACID) 30 MG capsule Take 1 capsule (30 mg total) by mouth daily at 12 noon. 90 capsule 2  . liraglutide (VICTOZA) 18 MG/3ML SOPN Inject 0.2 mLs (1.2 mg total) into the skin daily. 18 mL 6  . lisinopril (PRINIVIL,ZESTRIL) 20 MG tablet Take 20 mg by mouth daily.    . metoprolol succinate (TOPROL-XL) 25 MG 24 hr tablet     . metoprolol tartrate (LOPRESSOR) 25 MG tablet Take 1 tablet (25 mg total) by mouth 2 (two) times daily. 180 tablet 0  . nabumetone (RELAFEN) 500 MG tablet Take 500 mg by mouth 2 (two) times daily.    . pantoprazole (PROTONIX) 40 MG tablet Take 1 tablet (40 mg total) by mouth daily. 90 tablet 2  . rosuvastatin (CRESTOR) 40 MG tablet TAKE 1 TABLET BY MOUTH EVERY NIGHT AT BEDTIME 30 tablet 6   No current facility-administered medications on file prior to visit.   Past Medical History:  Diagnosis  Date  . Age-related osteoporosis without current pathological fracture 06/18/2019  . Allergic rhinitis   . Diabetes ([place])   . Dysphasia following cerebral infarction 06/18/2019  . Hyperlipidemia   . Hypertension   . Mixed hyperlipidemia 06/18/2019  . Mixed incontinence 06/18/2019  . Sequelae of poliomyelitis 06/18/2019  . Type 2 diabetes mellitus with other specified complication ([place]) 07/13/256   Past Surgical History:  Procedure Laterality Date  . ABDOMINAL HYSTERECTOMY    . CATARACT EXTRACTION Right   . CHOLECYSTECTOMY    . ROTATOR CUFF REPAIR Bilateral     History reviewed. No pertinent family history. Social History   Socioeconomic History  . Marital status: Widowed    Spouse name: Not on file  . Number of children: Not on file  . Years of education: Not on file  . Highest  education level: Not on file  Occupational History  . Occupation: Disabled  Tobacco Use  . Smoking status: Former Smoker    Packs/day: 1.00    Years: 5.00    Pack years: 5.00    Types: Cigarettes    Quit date: 03/16/1987    Years since quitting: 32.6  . Smokeless tobacco: Never Used  Substance and Sexual Activity  . Alcohol use: No  . Drug use: No  . Sexual activity: Not Currently  Other Topics Concern  . Not on file  Social History Narrative  . Not on file   Social Determinants of Health   Financial Resource Strain:   . Difficulty of Paying Living Expenses: Not on file  Food Insecurity:   . Worried About Charity fundraiser in the Last Year: Not on file  . Ran Out of Food in the Last Year: Not on file  Transportation Needs:   . Lack of Transportation (Medical): Not on file  . Lack of Transportation (Non-Medical): Not on file  Physical Activity:   . Days of Exercise per Week: Not on file  . Minutes of Exercise per Session: Not on file  Stress:   . Feeling of Stress : Not on file  Social Connections:   . Frequency of Communication with Friends and Family: Not on file  . Frequency of Social Gatherings with Friends and Family: Not on file  . Attends Religious Services: Not on file  . Active Member of Clubs or Organizations: Not on file  . Attends Archivist Meetings: Not on file  . Marital Status: Not on file    Review of Systems  Constitutional: Negative.   HENT: Negative.   Eyes: Negative.   Respiratory: Negative for apnea, shortness of breath and wheezing.   Cardiovascular: Negative for chest pain, palpitations and leg swelling.  Gastrointestinal: Negative.   Endocrine: Negative.   Genitourinary: Positive for dysuria.  Musculoskeletal: Negative.   Skin: Negative.   Neurological: Negative.   Hematological: Negative.   Psychiatric/Behavioral: Negative.      Objective:  BP 126/80   Pulse 60   Temp (!) 97 F (36.1 C)   Resp 16   Ht 5\' 4"  (1.626  m)   Wt 145 lb (65.8 kg)   SpO2 94%   BMI 24.89 kg/m   BP/Weight 11/16/2019 08/09/7822 04/18/5359  Systolic BP 443 154 008  Diastolic BP 80 84 78  Wt. (Lbs) 145 142.2 142.2  BMI 24.89 24.41 25.19    Physical Exam Vitals reviewed.  Constitutional:      Appearance: Normal appearance. She is well-developed.  HENT:     Head: Normocephalic.  Right Ear: Tympanic membrane, ear canal and external ear normal.     Left Ear: Tympanic membrane, ear canal and external ear normal.     Mouth/Throat:     Mouth: Mucous membranes are moist.     Pharynx: Oropharynx is clear.  Eyes:     Extraocular Movements: Extraocular movements intact.     Pupils: Pupils are equal, round, and reactive to light.  Cardiovascular:     Rate and Rhythm: Normal rate and regular rhythm.     Pulses: Normal pulses.     Heart sounds: Normal heart sounds.  Pulmonary:     Effort: Pulmonary effort is normal.     Breath sounds: Normal breath sounds.  Abdominal:     General: Abdomen is flat.     Tenderness: There is abdominal tenderness.  Musculoskeletal:        General: Normal range of motion.     Cervical back: Rigidity present.  Skin:    General: Skin is warm and dry.     Capillary Refill: Capillary refill takes less than 2 seconds.  Neurological:     General: No focal deficit present.     Mental Status: She is alert and oriented to person, place, and time. Mental status is at baseline.  Psychiatric:        Mood and Affect: Mood normal.        Behavior: Behavior normal.        Thought Content: Thought content normal.        Judgment: Judgment normal.       Lab Results  Component Value Date   WBC 6.5 06/25/2019   HGB 14.3 06/25/2019   HCT 44.4 06/25/2019   PLT 249 06/25/2019   GLUCOSE 104 (H) 08/17/2019   CHOL 162 06/25/2019   TRIG 91 06/25/2019   HDL 65 06/25/2019   LDLCALC 80 06/25/2019   ALT 24 08/17/2019   AST 35 08/17/2019   NA 143 08/17/2019   K 5.0 08/17/2019   CL 105 08/17/2019    CREATININE 0.85 08/17/2019   BUN 23 08/17/2019   CO2 24 08/17/2019   HGBA1C 7.3 (H) 06/25/2019   MICROALBUR 30 06/25/2019      Assessment & Plan:   1. Recurrent UTI - nitrofurantoin (MACRODANTIN) 50 MG capsule; Take 1 capsule (50 mg total) by mouth at bedtime.  Dispense: 90 capsule; Refill: 2 Patien twill be put on antibiotic prophylaxis to prevent recurrent infection 2. Diffuse abdominal pain abdominal pain is related to bladder pain and spasms, no other cause found    Meds ordered this encounter  Medications  . nitrofurantoin (MACRODANTIN) 50 MG capsule    Sig: Take 1 capsule (50 mg total) by mouth at bedtime.    Dispense:  90 capsule    Refill:  2       Follow-up: Return as scheduled.  An After Visit Summary was printed and given to the patient.  [place] [phone]

## 2019-11-18 DIAGNOSIS — N390 Urinary tract infection, site not specified: Secondary | ICD-10-CM | POA: Insufficient documentation

## 2019-11-21 ENCOUNTER — Other Ambulatory Visit: Payer: Self-pay | Admitting: Legal Medicine

## 2019-11-26 ENCOUNTER — Other Ambulatory Visit: Payer: Self-pay

## 2019-11-26 ENCOUNTER — Other Ambulatory Visit: Payer: Self-pay | Admitting: Legal Medicine

## 2019-11-26 ENCOUNTER — Telehealth: Payer: Self-pay

## 2019-11-26 DIAGNOSIS — N390 Urinary tract infection, site not specified: Secondary | ICD-10-CM

## 2019-11-26 MED ORDER — CEPHALEXIN 500 MG PO CAPS: 500.0000 mg | ORAL_CAPSULE | Freq: Four times a day (QID) | ORAL | 3 refills | Status: DC

## 2019-11-26 MED ORDER — CEPHALEXIN 500 MG PO TABS: 500.0000 mg | ORAL_TABLET | Freq: Every day | ORAL | 1 refills | Status: DC

## 2019-11-26 NOTE — Telephone Encounter (Signed)
Patient was taking nitrofurantoin and she had a break up and she was itching all her body. She asked if can you send something else?

## 2019-12-04 ENCOUNTER — Ambulatory Visit: Payer: Medicare HMO | Admitting: Legal Medicine

## 2019-12-06 ENCOUNTER — Other Ambulatory Visit: Payer: Self-pay | Admitting: Legal Medicine

## 2019-12-06 DIAGNOSIS — E782 Mixed hyperlipidemia: Secondary | ICD-10-CM

## 2019-12-06 DIAGNOSIS — R1084 Generalized abdominal pain: Secondary | ICD-10-CM

## 2019-12-14 DIAGNOSIS — R32 Unspecified urinary incontinence: Secondary | ICD-10-CM | POA: Diagnosis not present

## 2019-12-17 ENCOUNTER — Ambulatory Visit: Payer: Medicare HMO | Admitting: Legal Medicine

## 2019-12-20 ENCOUNTER — Ambulatory Visit: Payer: Medicare HMO | Admitting: Legal Medicine

## 2019-12-24 ENCOUNTER — Other Ambulatory Visit: Payer: Self-pay

## 2019-12-24 ENCOUNTER — Encounter: Payer: Self-pay | Admitting: Legal Medicine

## 2019-12-24 ENCOUNTER — Ambulatory Visit (INDEPENDENT_AMBULATORY_CARE_PROVIDER_SITE_OTHER): Payer: Medicare HMO | Admitting: Legal Medicine

## 2019-12-24 VITALS — BP 120/60 | HR 65 | Temp 97.7°F | Ht 65.0 in | Wt 147.6 lb

## 2019-12-24 DIAGNOSIS — E1169 Type 2 diabetes mellitus with other specified complication: Secondary | ICD-10-CM

## 2019-12-24 DIAGNOSIS — Z794 Long term (current) use of insulin: Secondary | ICD-10-CM | POA: Diagnosis not present

## 2019-12-24 DIAGNOSIS — M810 Age-related osteoporosis without current pathological fracture: Secondary | ICD-10-CM

## 2019-12-24 DIAGNOSIS — E782 Mixed hyperlipidemia: Secondary | ICD-10-CM | POA: Diagnosis not present

## 2019-12-24 DIAGNOSIS — N952 Postmenopausal atrophic vaginitis: Secondary | ICD-10-CM

## 2019-12-24 DIAGNOSIS — N3946 Mixed incontinence: Secondary | ICD-10-CM

## 2019-12-24 DIAGNOSIS — Z23 Encounter for immunization: Secondary | ICD-10-CM | POA: Diagnosis not present

## 2019-12-24 NOTE — Progress Notes (Addendum)
Subjective:  Patient ID: Rebecca Hodge, female    DOB: 1940-07-04  Age: 79 y.o. MRN: [id number]  Chief Complaint  Patient presents with  . Diabetes  . Hypertension    HPI: chronic visit  Patient present with type 2 diabetes.  Specifically, this is type 2, insuline requiring diabetes, complicated by hypertension and hypercholesterolemia.  Compliance with treatment has been good; patient take medicines as directed, maintains diet and exercise regimen, follows up as directed, and is keeping glucose diary.  Date of  diagnosis 2010.  Depression screen has been performed.Tobacco screen nonsmoker. Current medicines for diabetes levemir 20 units and liraglutide.  Patient is on lisinopril for renal protection and crestor for cholesterol control.  Patient performs foot exams daily and last ophthalmologic exam was yes.  Patient presents for follow up of hypertension.  Patient tolerating metoprolol, lisinopril well with side effects.  Patient was diagnosed with hypertension 2010 so has been treated for hypertension for 10 years.Patient is working on maintaining diet and exercise regimen and follows up as directed. Complication include none. Patient presents with hyperlipidemia.  Compliance with treatment has been good; patient takes medicines as directed, maintains low cholesterol diet, follows up as directed, and maintains exercise regimen.  Patient is using crestor without problems.   Current Outpatient Medications on File Prior to Visit  Medication Sig Dispense Refill  . alendronate (FOSAMAX) 70 MG tablet TAKE 1 TABLET BY MOUTH WEEKLY ON THE SAME DAY EACH WEEK, WITH A FULL GLASS OF WATER AT LEAST 30 MINUTES BEFORE FIRST FOOD OR BEVERAGE OF THE DAY. REMAIN UPRIGHT FOR AT LEAST 30 MINUTES AFTER TAKING 4 tablet 6  . azelastine (ASTELIN) 0.1 % nasal spray USE 1 SPRAY IN EACH NOSTRIL TWICE DAILY 30 mL 6  . BD PEN NEEDLE NANO U/F 32G X 4 MM MISC See admin instructions.    . benzonatate (TESSALON) 100  MG capsule Take 1 capsule (100 mg total) by mouth 2 (two) times daily as needed for cough. 20 capsule 2  . Cephalexin 500 MG tablet Take 1 tablet (500 mg total) by mouth at bedtime. 30 tablet 1  . Continuous Blood Gluc Receiver (FREESTYLE LIBRE 2 READER) DEVI 1 each by Does not apply route in the morning, at noon, and at bedtime. 1 each 0  . Continuous Blood Gluc Sensor (FREESTYLE LIBRE 2 SENSOR) MISC 1 each by Does not apply route every 14 (fourteen) days. 2 each 6  . esomeprazole (NEXIUM) 40 MG capsule Take 40 mg by mouth daily before breakfast.    . estradiol (ESTRACE) 0.1 MG/GM vaginal cream Place 1 Applicatorful vaginally 3 (three) times a week. 42.5 g 3  . ezetimibe (ZETIA) 10 MG tablet TAKE 1 TABLET BY MOUTH DAILY 30 tablet 6  . fluticasone (FLONASE) 50 MCG/ACT nasal spray Place 1 spray into both nostrils daily. 16 mL 6  . gabapentin (NEURONTIN) 300 MG capsule Take 1 capsule (300 mg total) by mouth 3 (three) times daily. 270 capsule 2  . glimepiride (AMARYL) 1 MG tablet Take 1 tablet (1 mg total) by mouth daily before breakfast. 90 tablet 2  . insulin detemir (LEVEMIR) 100 UNIT/ML injection Inject 20 Units into the skin at bedtime.    . lansoprazole (PREVACID) 30 MG capsule Take 1 capsule (30 mg total) by mouth daily at 12 noon. 90 capsule 2  . liraglutide (VICTOZA) 18 MG/3ML SOPN Inject 0.2 mLs (1.2 mg total) into the skin daily. 18 mL 6  . lisinopril (PRINIVIL,ZESTRIL) 20 MG tablet  Take 20 mg by mouth daily.    . metoprolol succinate (TOPROL-XL) 25 MG 24 hr tablet     . metoprolol tartrate (LOPRESSOR) 25 MG tablet TAKE 1 TABLET BY MOUTH 2 TIMES A DAY 180 tablet 2  . nabumetone (RELAFEN) 500 MG tablet Take 500 mg by mouth 2 (two) times daily.    . pantoprazole (PROTONIX) 40 MG tablet TAKE 1 TABLET BY MOUTH ONCE DAILY 30 tablet 6  . rosuvastatin (CRESTOR) 40 MG tablet TAKE 1 TABLET BY MOUTH EVERY NIGHT AT BEDTIME 30 tablet 6   No current facility-administered medications on file prior to  visit.   Past Medical History:  Diagnosis Date  . Age-related osteoporosis without current pathological fracture 06/18/2019  . Allergic rhinitis   . Diabetes ([place])   . Dysphasia following cerebral infarction 06/18/2019  . Hyperlipidemia   . Hypertension   . Mixed hyperlipidemia 06/18/2019  . Mixed incontinence 06/18/2019  . Sequelae of poliomyelitis 06/18/2019  . Type 2 diabetes mellitus with other specified complication ([place]) 03/20/1094   Past Surgical History:  Procedure Laterality Date  . ABDOMINAL HYSTERECTOMY    . CATARACT EXTRACTION Right   . CHOLECYSTECTOMY    . ROTATOR CUFF REPAIR Bilateral     No family history on file. Social History   Socioeconomic History  . Marital status: Widowed    Spouse name: Not on file  . Number of children: Not on file  . Years of education: Not on file  . Highest education level: Not on file  Occupational History  . Occupation: Disabled  Tobacco Use  . Smoking status: Former Smoker    Packs/day: 1.00    Years: 5.00    Pack years: 5.00    Types: Cigarettes    Quit date: 03/16/1987    Years since quitting: 32.7  . Smokeless tobacco: Never Used  Substance and Sexual Activity  . Alcohol use: No  . Drug use: No  . Sexual activity: Not Currently  Other Topics Concern  . Not on file  Social History Narrative  . Not on file   Social Determinants of Health   Financial Resource Strain:   . Difficulty of Paying Living Expenses: Not on file  Food Insecurity:   . Worried About Charity fundraiser in the Last Year: Not on file  . Ran Out of Food in the Last Year: Not on file  Transportation Needs:   . Lack of Transportation (Medical): Not on file  . Lack of Transportation (Non-Medical): Not on file  Physical Activity:   . Days of Exercise per Week: Not on file  . Minutes of Exercise per Session: Not on file  Stress:   . Feeling of Stress : Not on file  Social Connections:   . Frequency of Communication with Friends and Family: Not on file   . Frequency of Social Gatherings with Friends and Family: Not on file  . Attends Religious Services: Not on file  . Active Member of Clubs or Organizations: Not on file  . Attends Archivist Meetings: Not on file  . Marital Status: Not on file    Review of Systems  Constitutional: Negative.   Eyes: Negative.   Respiratory: Negative for cough and shortness of breath.   Cardiovascular: Negative for chest pain, palpitations and leg swelling.  Gastrointestinal: Negative.   Endocrine: Negative.   Genitourinary: Negative.        Some incontinence  Musculoskeletal: Negative.   Skin: Negative.   Neurological: Negative.  Hematological: Negative.   Psychiatric/Behavioral: Negative.      Objective:  BP 120/60   Pulse 65   Temp 97.7 F (36.5 C)   Ht 5\' 5"  (1.651 m)   Wt 147 lb 9.6 oz (67 kg)   SpO2 96%   BMI 24.56 kg/m   BP/Weight 12/24/2019 11/16/2019 9/89/2119  Systolic BP 417 408 144  Diastolic BP 60 80 84  Wt. (Lbs) 147.6 145 142.2  BMI 24.56 24.89 24.41    Physical Exam Vitals reviewed.  Constitutional:      Appearance: Normal appearance.  HENT:     Head: Normocephalic.     Right Ear: Tympanic membrane, ear canal and external ear normal.     Left Ear: Tympanic membrane, ear canal and external ear normal.     Nose: Nose normal.     Mouth/Throat:     Mouth: Mucous membranes are moist.     Pharynx: Oropharynx is clear.  Eyes:     Extraocular Movements: Extraocular movements intact.     Pupils: Pupils are equal, round, and reactive to light.  Cardiovascular:     Rate and Rhythm: Normal rate and regular rhythm.     Pulses: Normal pulses.  Pulmonary:     Effort: Pulmonary effort is normal.     Breath sounds: Normal breath sounds.  Abdominal:     General: Abdomen is flat. Bowel sounds are normal.     Palpations: Abdomen is soft.     Tenderness: There is abdominal tenderness.  Musculoskeletal:     Cervical back: Normal range of motion and neck supple.   Skin:    General: Skin is warm and dry.     Capillary Refill: Capillary refill takes less than 2 seconds.  Neurological:     General: No focal deficit present.     Mental Status: She is alert. Mental status is at baseline.  Psychiatric:        Mood and Affect: Mood normal.        Behavior: Behavior normal.     Diabetic Foot Exam - Simple   Simple Foot Form Diabetic Foot exam was performed with the following findings: Yes 12/24/2019 10:55 AM  Visual Inspection No deformities, no ulcerations, no other skin breakdown bilaterally: Yes Sensation Testing Intact to touch and monofilament testing bilaterally: Yes Pulse Check Comments      Lab Results  Component Value Date   WBC 6.5 06/25/2019   HGB 14.3 06/25/2019   HCT 44.4 06/25/2019   PLT 249 06/25/2019   GLUCOSE 104 (H) 08/17/2019   CHOL 162 06/25/2019   TRIG 91 06/25/2019   HDL 65 06/25/2019   LDLCALC 80 06/25/2019   ALT 24 08/17/2019   AST 35 08/17/2019   NA 143 08/17/2019   K 5.0 08/17/2019   CL 105 08/17/2019   CREATININE 0.85 08/17/2019   BUN 23 08/17/2019   CO2 24 08/17/2019   HGBA1C 7.3 (H) 06/25/2019   MICROALBUR 30 06/25/2019      Assessment & Plan:   1. Long term (current) use of insulin ([place]) Patient is on long term insulin  2. Type 2 diabetes mellitus with other specified complication, with long-term current use of insulin ([facility]) - Comprehensive metabolic panel - Hemoglobin A1c - CBC with Differential/Platelet An individual care plan for diabetes was established and reinforced today.  The patient's status was assessed using clinical findings on exam, labs and diagnostic testing. Patient success at meeting goals based on disease specific evidence-based guidelines and found to  be good controlled. Medications were assessed and patient's understanding of the medical issues , including barriers were assessed. Recommend adherence to a diabetic diet, a graduated exercise program, HgbA1c level is checked  quarterly, and urine microalbumin performed yearly .  Annual mono-filament sensation testing performed. Lower blood pressure and control hyperlipidemia is important. Get annual eye exams and annual flu shots and smoking cessation discussed.  Self management goals were discussed. Patient needs to check glucose 3 times a day and record.  She may require mealtime insulin.  Freestyle is recommended.  3. Postmenopausal atrophic vaginitis AN INDIVIDUAL CARE PLAN for atrophic vaginitis was established and reinforced today.  The patient's status was assessed using clinical findings on exam, labs, and other diagnostic testing. Patient's success at meeting treatment goals based on disease specific evidence-bassed guidelines and found to be in good control. RECOMMENDATIONS include maintaining present medicines and treatment.  4. Mixed hyperlipidemia - Lipid panel AN INDIVIDUAL CARE PLAN for hyperlipidemia/ cholesterol was established and reinforced today.  The patient's status was assessed using clinical findings on exam, lab and other diagnostic tests. The patient's disease status was assessed based on evidence-based guidelines and found to be well controlled. MEDICATIONS were reviewed. SELF MANAGEMENT GOALS have been discussed and patient's success at attaining the goal of low cholesterol was assessed. RECOMMENDATION given include regular exercise 3 days a week and low cholesterol/low fat diet. CLINICAL SUMMARY including written plan to identify barriers unique to the patient due to social or economic  reasons was discussed.  5. Age-related osteoporosis without current pathological fracture AN INDIVIDUAL CARE PLAN osteoporosis was established and reinforced today.  The patient's status was assessed using clinical findings on exam, labs, and other diagnostic testing. Patient's success at meeting treatment goals based on disease specific evidence-bassed guidelines and found to be in fair  control. RECOMMENDATIONS include maintaining present medicines and treatment.  6. Mixed incontinence AN INDIVIDUAL CARE PLAN for incontinence  was established and reinforced today.  The patient's status was assessed using clinical findings on exam, labs, and other diagnostic testing. Patient's success at meeting treatment goals based on disease specific evidence-bassed guidelines and found to be in fair control. RECOMMENDATIONS include maintaining present medicines and treatment.  7. Need for immunization against influenza - Flu Vaccine QUAD High Dose(Fluad)      Orders Placed This Encounter  Procedures  . Flu Vaccine QUAD High Dose(Fluad)  . Comprehensive metabolic panel  . Hemoglobin A1c  . Lipid panel  . CBC with Differential/Platelet     Follow-up: Return in about 4 months (around 04/25/2020) for fasting.  An After Visit Summary was printed and given to the patient.  [place] [phone]

## 2019-12-25 LAB — CBC WITH DIFFERENTIAL/PLATELET
Basophils Absolute: 0.1 10*3/uL (ref 0.0–0.2)
Basos: 1 %
EOS (ABSOLUTE): 0.1 10*3/uL (ref 0.0–0.4)
Eos: 2 %
Hematocrit: 44.4 % (ref 34.0–46.6)
Hemoglobin: 14.3 g/dL (ref 11.1–15.9)
Immature Grans (Abs): 0 10*3/uL (ref 0.0–0.1)
Immature Granulocytes: 0 %
Lymphocytes Absolute: 2.5 10*3/uL (ref 0.7–3.1)
Lymphs: 34 %
MCH: 27.8 pg (ref 26.6–33.0)
MCHC: 32.2 g/dL (ref 31.5–35.7)
MCV: 86 fL (ref 79–97)
Monocytes Absolute: 0.6 10*3/uL (ref 0.1–0.9)
Monocytes: 8 %
Neutrophils Absolute: 4 10*3/uL (ref 1.4–7.0)
Neutrophils: 55 %
Platelets: 222 10*3/uL (ref 150–450)
RBC: 5.14 x10E6/uL (ref 3.77–5.28)
RDW: 14.5 % (ref 11.7–15.4)
WBC: 7.3 10*3/uL (ref 3.4–10.8)

## 2019-12-25 LAB — COMPREHENSIVE METABOLIC PANEL
ALT: 19 IU/L (ref 0–32)
AST: 24 IU/L (ref 0–40)
Albumin/Globulin Ratio: 1.6 (ref 1.2–2.2)
Albumin: 4.2 g/dL (ref 3.7–4.7)
Alkaline Phosphatase: 65 IU/L (ref 44–121)
BUN/Creatinine Ratio: 12 (ref 12–28)
BUN: 13 mg/dL (ref 8–27)
Bilirubin Total: 0.6 mg/dL (ref 0.0–1.2)
CO2: 26 mmol/L (ref 20–29)
Calcium: 9.2 mg/dL (ref 8.7–10.3)
Chloride: 105 mmol/L (ref 96–106)
Creatinine, Ser: 1.06 mg/dL — ABNORMAL HIGH (ref 0.57–1.00)
GFR calc Af Amer: 57 mL/min/{1.73_m2} — ABNORMAL LOW (ref >59)
GFR calc non Af Amer: 50 mL/min/{1.73_m2} — ABNORMAL LOW (ref >59)
Globulin, Total: 2.6 g/dL (ref 1.5–4.5)
Glucose: 208 mg/dL — ABNORMAL HIGH (ref 65–99)
Potassium: 4.6 mmol/L (ref 3.5–5.2)
Sodium: 142 mmol/L (ref 134–144)
Total Protein: 6.8 g/dL (ref 6.0–8.5)

## 2019-12-25 LAB — LIPID PANEL
Chol/HDL Ratio: 2.8 ratio (ref 0.0–4.4)
Cholesterol, Total: 167 mg/dL (ref 100–199)
HDL: 60 mg/dL (ref >39)
LDL Chol Calc (NIH): 82 mg/dL (ref 0–99)
Triglycerides: 144 mg/dL (ref 0–149)
VLDL Cholesterol Cal: 25 mg/dL (ref 5–40)

## 2019-12-25 LAB — HEMOGLOBIN A1C
Est. average glucose Bld gHb Est-mCnc: 180 mg/dL
Hgb A1c MFr Bld: 7.9 % — ABNORMAL HIGH (ref 4.8–5.6)

## 2019-12-25 LAB — CARDIOVASCULAR RISK ASSESSMENT

## 2019-12-25 NOTE — Progress Notes (Signed)
Glucose 208, kidneys remain stage 3, liver tests normal, A1c 7.9  we want to keep less than 8.0, Cholesterol normal, CBC normal lp

## 2019-12-28 ENCOUNTER — Other Ambulatory Visit: Payer: Self-pay | Admitting: Legal Medicine

## 2019-12-28 DIAGNOSIS — E1169 Type 2 diabetes mellitus with other specified complication: Secondary | ICD-10-CM

## 2019-12-28 DIAGNOSIS — Z794 Long term (current) use of insulin: Secondary | ICD-10-CM

## 2020-01-02 ENCOUNTER — Other Ambulatory Visit: Payer: Self-pay | Admitting: Legal Medicine

## 2020-01-07 ENCOUNTER — Other Ambulatory Visit: Payer: Self-pay

## 2020-01-07 DIAGNOSIS — E1169 Type 2 diabetes mellitus with other specified complication: Secondary | ICD-10-CM

## 2020-01-07 DIAGNOSIS — E782 Mixed hyperlipidemia: Secondary | ICD-10-CM

## 2020-01-08 ENCOUNTER — Telehealth: Payer: Self-pay | Admitting: Legal Medicine

## 2020-01-08 NOTE — Chronic Care Management (AMB) (Signed)
  Chronic Care Management   Note  01/08/2020 Name: CELISA SCHOENBERG MRN: [id number] DOB: 06-Nov-1940  ALEJANDRA HUNT is a 79 y.o. year old female who is a primary care patient of Lillard Anes, MD. I reached out to [name] by phone today in response to a referral sent by [name] PCP, Lillard Anes, MD.   Ms. Idrovo was given information about Chronic Care Management services today including:  1. CCM service includes personalized support from designated clinical staff supervised by her physician, including individualized plan of care and coordination with other care providers 2. 24/7 contact phone numbers for assistance for urgent and routine care needs. 3. Service will only be billed when office clinical staff spend 20 minutes or more in a month to coordinate care. 4. Only one practitioner may furnish and bill the service in a calendar month. 5. The patient may stop CCM services at any time (effective at the end of the month) by phone call to the office staff.   Patient agreed to services and verbal consent obtained.   Follow up plan:   [place]

## 2020-01-15 DIAGNOSIS — R32 Unspecified urinary incontinence: Secondary | ICD-10-CM | POA: Diagnosis not present

## 2020-01-18 ENCOUNTER — Telehealth: Payer: Self-pay

## 2020-01-18 NOTE — Telephone Encounter (Signed)
I faxed a copy on 01/18/2020.

## 2020-01-18 NOTE — Telephone Encounter (Signed)
Left voicemail for pt an pt's daughter to return our call and let us know how many times she is checking her blood sugar a day and her insulin regimen. [employer] is asking for this an her most recent progress notes for the approval of her Freestyle Libre 2 faxed over to [place] at [phone].

## 2020-01-18 NOTE — Telephone Encounter (Signed)
Copy note from 12/24/19 for this request lp

## 2020-01-18 NOTE — Telephone Encounter (Signed)
Dr. Henrene Pastor please advise.  [employer] is requesting pt's recent progress notes and in those progress notes they need documentation on how many times a day she checks her sugar before insurance can approve her Freestyle [place] 2 device. Pt was contacted and she reports checking her blood sugar 3 times a day and shes taking her Leve[place] 20 units at bedtime an her Glimperide after breakfast every day. If you could put in those notes she checks her blood sugar 3 times a day we can print those progress notes and fax to [employer].

## 2020-01-22 DIAGNOSIS — E1169 Type 2 diabetes mellitus with other specified complication: Secondary | ICD-10-CM | POA: Diagnosis not present

## 2020-01-28 ENCOUNTER — Telehealth: Payer: Medicare HMO

## 2020-01-29 ENCOUNTER — Telehealth: Payer: Self-pay

## 2020-01-29 NOTE — Chronic Care Management (AMB) (Signed)
Chronic Care Management Pharmacy Assistant   Name: Rebecca Hodge  MRN: [id number] DOB: [birth year]  Reason for Encounter: Medication Review Initial Questions For pharmacist visit on 01/31/2020.  Have you seen any other providers since your last visit? Yes. 12/24/2019 Willette Brace - PCP   Any changes in your medications or health? No  Any side effects from any medications? No  Do you have an symptoms or problems not managed by your medications? No , States daughter helps with medications.  Any concerns about your health right now? No  Has your provider asked that you check blood pressure, blood sugar, or follow special diet at home? No , Patient states she has blood pressure cuff , she takes once a day . Needs glucose monitor unable to check her sugar levels.  Do you get any type of exercise on a regular basis?  Patient states she walks to mail box , cleans house , leg exercise and states she is active.  Can you think of a goal you would like to reach for your health? Patient would like to get her sugar levels good or somewhat normal.  Do you have any problems getting your medications? Yes , she is having trouble paying for her Levemir / Victoza.  Is there anything that you would like to discuss during the appointment?  Patient would like to get her blood sugars down / medications assistance/ glucose monitor.  Patient is aware to have her  medications and supplements available at phone visit.    PCP : Lillard Anes, MD  Allergies:   Allergies  Allergen Reactions  . Nitrofuran Derivatives Hives and Itching  . Codeine   . Morphine And Related   . Sulfa Antibiotics     Medications: Outpatient Encounter Medications as of 01/29/2020  Medication Sig  . alendronate (FOSAMAX) 70 MG tablet TAKE 1 TABLET BY MOUTH WEEKLY ON THE SAME DAY EACH WEEK, WITH A FULL GLASS OF WATER AT LEAST 30 MINUTES BEFORE FIRST FOOD OR BEVERAGE OF THE DAY. REMAIN UPRIGHT FOR AT LEAST  30 MINUTES AFTER TAKING  . azelastine (ASTELIN) 0.1 % nasal spray USE 1 SPRAY IN EACH NOSTRIL TWICE DAILY  . BD PEN NEEDLE NANO U/F 32G X 4 MM MISC See admin instructions.  . benzonatate (TESSALON) 100 MG capsule Take 1 capsule (100 mg total) by mouth 2 (two) times daily as needed for cough.  . Cephalexin 500 MG tablet Take 1 tablet (500 mg total) by mouth at bedtime.  . Continuous Blood Gluc Receiver (FREESTYLE LIBRE 2 READER) DEVI 1 each by Does not apply route in the morning, at noon, and at bedtime.  . Continuous Blood Gluc Sensor (FREESTYLE LIBRE 2 SENSOR) MISC 1 each by Does not apply route every 14 (fourteen) days.  Marland Kitchen esomeprazole (NEXIUM) 40 MG capsule Take 40 mg by mouth daily before breakfast.  . estradiol (ESTRACE) 0.1 MG/GM vaginal cream Place 1 Applicatorful vaginally 3 (three) times a week.  . ezetimibe (ZETIA) 10 MG tablet TAKE 1 TABLET BY MOUTH DAILY  . fluticasone (FLONASE) 50 MCG/ACT nasal spray Place 1 spray into both nostrils daily.  Marland Kitchen gabapentin (NEURONTIN) 300 MG capsule Take 1 capsule (300 mg total) by mouth 3 (three) times daily.  Marland Kitchen glimepiride (AMARYL) 1 MG tablet Take 1 tablet (1 mg total) by mouth daily before breakfast.  . insulin detemir (LEVEMIR) 100 UNIT/ML injection Inject 20 Units into the skin at bedtime.  . lansoprazole (PREVACID) 30 MG capsule Take 1 capsule (  30 mg total) by mouth daily at 12 noon.  . liraglutide (VICTOZA) 18 MG/3ML SOPN Inject 0.2 mLs (1.2 mg total) into the skin daily.  Marland Kitchen lisinopril (PRINIVIL,ZESTRIL) 20 MG tablet Take 20 mg by mouth daily.  . metoprolol succinate (TOPROL-XL) 25 MG 24 hr tablet   . metoprolol tartrate (LOPRESSOR) 25 MG tablet TAKE 1 TABLET BY MOUTH 2 TIMES A DAY  . nabumetone (RELAFEN) 500 MG tablet Take 500 mg by mouth 2 (two) times daily.  . pantoprazole (PROTONIX) 40 MG tablet TAKE 1 TABLET BY MOUTH ONCE DAILY  . rosuvastatin (CRESTOR) 40 MG tablet TAKE 1 TABLET BY MOUTH EVERY NIGHT AT BEDTIME   No facility-administered  encounter medications on file as of 01/29/2020.    Current Diagnosis: Patient Active Problem List   Diagnosis Date Noted  . Recurrent UTI 11/18/2019  . Diffuse abdominal pain 08/17/2019  . BMI 25.0-25.9,adult 08/17/2019  . Sequelae of poliomyelitis 06/18/2019  . Dysphasia following cerebral infarction 06/18/2019  . Mixed incontinence 06/18/2019  . Type 2 diabetes mellitus with other specified complication ([place]) 68/02/7516  . Age-related osteoporosis without current pathological fracture 06/18/2019  . Long term (current) use of insulin ([place]) 06/18/2019  . Postmenopausal atrophic vaginitis 06/18/2019  . Mixed hyperlipidemia 06/18/2019  . Rib pain 06/18/2019  . Solitary pulmonary nodule 12/13/2012      Follow-Up:  Pharmacist Review -Reviewed Chart And Adherence Measures . Per Insurance data 100% Medication Compliance Cholesterol.  New application filled out to [employer] for [employer] . Awaiting provider and patient's signature and proof of income.  Elly Modena Owens Shark, [place] Notified  Judithann Sheen, [facility] Clinical Pharmacist Assistant [phone]

## 2020-01-30 NOTE — Chronic Care Management (AMB) (Deleted)
Chronic Care Management Pharmacy  Name: JERAE IZARD  MRN: [id number] DOB: 12/29/1940  Chief Complaint/ HPI  [name],  79 y.o. , female presents for their Initial CCM visit with the clinical pharmacist via telephone due to COVID-19 Pandemic.  PCP : Lillard Anes, MD  Their chronic conditions include: type 2 diabetes, osteoporosis, mixed incontinence, hyperlipidemia, postmenopausal atrophic vaginitis. ***  Office Visits:***  12/24/2019 - flu shot given. Glucose 208, kidneys remain stage 3, liver tests normal, A1c 7.9  we want to keep less than 8.0, Cholesterol normal, CBC normal 11/16/2019 - Recurrent UTI - macrodantin prescribed. Patient declines referral to urology.  10/01/2019 - Hematuria treated with keflex.  08/17/2019 - abdominal pain reported. Urine clear.  Consult Visit:***  Medications: Outpatient Encounter Medications as of 01/31/2020  Medication Sig  . alendronate (FOSAMAX) 70 MG tablet TAKE 1 TABLET BY MOUTH WEEKLY ON THE SAME DAY EACH WEEK, WITH A FULL GLASS OF WATER AT LEAST 30 MINUTES BEFORE FIRST FOOD OR BEVERAGE OF THE DAY. REMAIN UPRIGHT FOR AT LEAST 30 MINUTES AFTER TAKING  . azelastine (ASTELIN) 0.1 % nasal spray USE 1 SPRAY IN EACH NOSTRIL TWICE DAILY  . BD PEN NEEDLE NANO U/F 32G X 4 MM MISC See admin instructions.  . benzonatate (TESSALON) 100 MG capsule Take 1 capsule (100 mg total) by mouth 2 (two) times daily as needed for cough.  . Cephalexin 500 MG tablet Take 1 tablet (500 mg total) by mouth at bedtime.  . Continuous Blood Gluc Receiver (FREESTYLE LIBRE 2 READER) DEVI 1 each by Does not apply route in the morning, at noon, and at bedtime.  . Continuous Blood Gluc Sensor (FREESTYLE LIBRE 2 SENSOR) MISC 1 each by Does not apply route every 14 (fourteen) days.  Marland Kitchen esomeprazole (NEXIUM) 40 MG capsule Take 40 mg by mouth daily before breakfast.  . estradiol (ESTRACE) 0.1 MG/GM vaginal cream Place 1 Applicatorful vaginally 3 (three) times a  week.  . ezetimibe (ZETIA) 10 MG tablet TAKE 1 TABLET BY MOUTH DAILY  . fluticasone (FLONASE) 50 MCG/ACT nasal spray Place 1 spray into both nostrils daily.  Marland Kitchen gabapentin (NEURONTIN) 300 MG capsule Take 1 capsule (300 mg total) by mouth 3 (three) times daily.  Marland Kitchen glimepiride (AMARYL) 1 MG tablet Take 1 tablet (1 mg total) by mouth daily before breakfast.  . insulin detemir (LEVEMIR) 100 UNIT/ML injection Inject 20 Units into the skin at bedtime.  . lansoprazole (PREVACID) 30 MG capsule Take 1 capsule (30 mg total) by mouth daily at 12 noon.  . liraglutide (VICTOZA) 18 MG/3ML SOPN Inject 0.2 mLs (1.2 mg total) into the skin daily.  Marland Kitchen lisinopril (PRINIVIL,ZESTRIL) 20 MG tablet Take 20 mg by mouth daily.  . metoprolol succinate (TOPROL-XL) 25 MG 24 hr tablet   . metoprolol tartrate (LOPRESSOR) 25 MG tablet TAKE 1 TABLET BY MOUTH 2 TIMES A DAY  . nabumetone (RELAFEN) 500 MG tablet Take 500 mg by mouth 2 (two) times daily.  . pantoprazole (PROTONIX) 40 MG tablet TAKE 1 TABLET BY MOUTH ONCE DAILY  . rosuvastatin (CRESTOR) 40 MG tablet TAKE 1 TABLET BY MOUTH EVERY NIGHT AT BEDTIME   No facility-administered encounter medications on file as of 01/31/2020.   Allergies  Allergen Reactions  . Nitrofuran Derivatives Hives and Itching  . Codeine   . Morphine And Related   . Sulfa Antibiotics    SDOH Screenings   Alcohol Screen:   . Last Alcohol Screening Score (AUDIT): Not on file  Depression (PHQ2-9): Low Risk   . PHQ-2 Score: 3  Financial Resource Strain:   . Difficulty of Paying Living Expenses: Not on file  Food Insecurity:   . Worried About Charity fundraiser in the Last Year: Not on file  . Ran Out of Food in the Last Year: Not on file  Housing:   . Last Housing Risk Score: Not on file  Physical Activity:   . Days of Exercise per Week: Not on file  . Minutes of Exercise per Session: Not on file  Social Connections:   . Frequency of Communication with Friends and Family: Not on file   . Frequency of Social Gatherings with Friends and Family: Not on file  . Attends Religious Services: Not on file  . Active Member of Clubs or Organizations: Not on file  . Attends Archivist Meetings: Not on file  . Marital Status: Not on file  Stress:   . Feeling of Stress : Not on file  Tobacco Use: Medium Risk  . Smoking Tobacco Use: Former Smoker  . Smokeless Tobacco Use: Never Used  Transportation Needs:   . Film/video editor (Medical): Not on file  . Lack of Transportation (Non-Medical): Not on file     Current Diagnosis/Assessment:  Goals Addressed   None     Diabetes   Recent Relevant Labs: Lab Results  Component Value Date/Time   HGBA1C 7.9 (H) 12/24/2019 11:04 AM   HGBA1C 7.3 (H) 06/25/2019 09:18 AM   MICROALBUR 30 06/25/2019 09:02 AM     Checking BG: {CHL HP Blood Glucose Monitoring Frequency:[phone]}  Recent FBG Readings: Recent pre-meal BG readings: *** Recent 2hr PP BG readings:  *** Recent HS BG readings: *** Patient has failed these meds in past: *** Patient is currently {CHL Controlled/Uncontrolled:[phone]} on the following medications: ***  Glimepiride 1 mg daily before breakfast  Levemir 100 unit/mL 20 units into skin at bedtime  Victoza 1.2 mg daily   Pen needles   Freestyle Libre Last diabetic Foot exam: No results found for: HMDIABEYEEXA  Last diabetic Eye exam: No results found for: HMDIABFOOTEX   We discussed: {CHL HP Upstream Pharmacy discussion:[phone]}  Plan  Continue {CHL HP Upstream Pharmacy Plans:[phone]} and  Hypertension   BP today is:  {CHL HP UPSTREAM Pharmacist BP ranges:[phone]}  Office blood pressures are  BP Readings from Last 3 Encounters:  12/24/19 120/60  11/16/19 126/80  10/01/19 122/84    Patient has failed these meds in the past: *** Patient is currently controlled/uncontrolled*** on the following medications: ***  Lisinopril 20 mg daily   Metoprolol tartrate 25 mg  bid Patient checks BP at home {CHL HP BP Monitoring Frequency:[phone]}  Patient home BP readings are ranging: ***  We discussed {CHL HP Upstream Pharmacy discussion:[phone]}  Plan  Continue {CHL HP Upstream Pharmacy Plans:[phone]}     Hyperlipidemia   LDL goal < ***  Last lipids Lab Results  Component Value Date   CHOL 167 12/24/2019   HDL 60 12/24/2019   LDLCALC 82 12/24/2019   TRIG 144 12/24/2019   CHOLHDL 2.8 12/24/2019   Hepatic Function Latest Ref Rng & Units 12/24/2019 08/17/2019 06/25/2019  Total Protein 6.0 - 8.5 g/dL 6.8 6.7 6.6  Albumin 3.7 - 4.7 g/dL 4.2 4.2 4.4  AST 0 - 40 IU/L 24 35 22  ALT 0 - 32 IU/L 19 24 14   Alk Phosphatase 44 - 121 IU/L 65 65 78  Total Bilirubin 0.0 - 1.2 mg/dL 0.6 0.5 0.5  The ASCVD Risk score Mikey Bussing DC Jr., et al., 2013) failed to calculate for the following reasons:   The 2013 ASCVD risk score is only valid for ages 59 to 86   Patient has failed these meds in past: *** Patient is currently {CHL Controlled/Uncontrolled:[phone]} on the following medications:  . ***Zetia 10 mg daily  . Rosuvastatin 40 mg daily at bedtime  We discussed:  {CHL HP Upstream Pharmacy discussion:[phone]}  Plan  Continue {CHL HP Upstream Pharmacy Plans:[phone]}   Osteopenia / Osteoporosis   Last DEXA Scan: ***   T-Score femoral neck: ***  T-Score total hip: ***  T-Score lumbar spine: ***  T-Score forearm radius: ***  10-year probability of major osteoporotic fracture: ***  10-year probability of hip fracture: ***  No results found for: VD25OH   Patient {is;is not an osteoporosis candidate:23886}  Patient has failed these meds in past: *** Patient is currently {CHL Controlled/Uncontrolled:[phone]} on the following medications:  . Alendronate 70 mg weekly  We discussed:  {Osteoporosis Counseling:23892}  Plan  Continue {CHL HP Upstream Pharmacy HENID:[id number]}  ***   Patient has failed these meds in past:  *** Patient is currently {CHL Controlled/Uncontrolled:[phone]} on the following medications:  . ***Nexium 40 mg daily before breakfast . Prevacid 30 mg daily at noon . Protonix 40 mg daily   We discussed:  ***  Plan  Continue {CHL HP Upstream Pharmacy Plans:[phone]}   Health Maintenance   Patient is currently {CHL Controlled/Uncontrolled:[phone]} on the following medications:  . ***nabumetone 500 mg bid - *** . Keflex 500 mg qhs - *** . Estradiol 0.1 mg/gm vaginal cream three times weekly . Gabapentin 300 mg daily  - *** . Flonase nasal spray 1 spray in both nostrils daily - *** . Benzonotate 100 mg bid prn cough  . Astelin 0.1% 1 spray in each nostril twice daily - *** .   We discussed:  ***  Plan  Continue {CHL HP Upstream Pharmacy WERXV:[id number]}  Vaccines   Reviewed and discussed patient's vaccination history.    Immunization History  Administered Date(s) Administered  . Fluad Quad(high Dose 65+) 12/24/2019  . Influenza Split 12/14/2011, 12/13/2012  . PFIZER SARS-COV-2 Vaccination 06/21/2019, 07/16/2019  . Pneumococcal Conjugate-13 08/31/2017  . Pneumococcal Polysaccharide-23 06/11/2016    Plan  Recommended patient receive *** vaccine in *** office.   Medication Management   Pt uses *** pharmacy for all medications Uses pill box? {Yes or If no, why not?:20788} Pt endorses ***% compliance  We discussed: {Pharmacy options:24294}  Plan  {US Pharmacy KDTO:67124}    Follow up: *** month phone visit  ***

## 2020-01-31 ENCOUNTER — Telehealth: Payer: Medicare HMO

## 2020-02-13 DIAGNOSIS — R32 Unspecified urinary incontinence: Secondary | ICD-10-CM | POA: Diagnosis not present

## 2020-02-21 ENCOUNTER — Other Ambulatory Visit: Payer: Self-pay | Admitting: Legal Medicine

## 2020-02-28 ENCOUNTER — Other Ambulatory Visit: Payer: Self-pay

## 2020-02-28 ENCOUNTER — Encounter: Payer: Self-pay | Admitting: Legal Medicine

## 2020-02-28 ENCOUNTER — Telehealth (INDEPENDENT_AMBULATORY_CARE_PROVIDER_SITE_OTHER): Payer: Medicare HMO | Admitting: Legal Medicine

## 2020-02-28 VITALS — BP 112/68 | HR 69 | Temp 98.0°F | Ht 60.5 in | Wt 146.0 lb

## 2020-02-28 DIAGNOSIS — J019 Acute sinusitis, unspecified: Secondary | ICD-10-CM | POA: Diagnosis not present

## 2020-02-28 MED ORDER — CEPHALEXIN 500 MG PO CAPS: 500.0000 mg | ORAL_CAPSULE | Freq: Two times a day (BID) | ORAL | 0 refills | Status: DC

## 2020-02-28 NOTE — Progress Notes (Signed)
Virtual Visit via Telephone Note   This visit type was conducted due to national recommendations for restrictions regarding the COVID-19 Pandemic (e.g. social distancing) in an effort to limit this patient's exposure and mitigate transmission in our community.  Due to her co-morbid illnesses, this patient is at least at moderate risk for complications without adequate follow up.  This format is felt to be most appropriate for this patient at this time.  The patient did not have access to video technology/had technical difficulties with video requiring transitioning to audio format only (telephone).  All issues noted in this document were discussed and addressed.  No physical exam could be performed with this format.  Patient verbally consented to a telehealth visit.   Date:  02/28/2020   ID:  Rebecca Hodge, DOB Aug 01, 1940, MRN [id number]  Patient Location: Home Provider Location: Office/Clinic  PCP:  Lillard Anes, MD   Evaluation Performed:  New Patient Evaluation  Chief Complaint:  Sinus pain start 2 weeks  History of Present Illness:    Rebecca Hodge is a 79 y.o. female with  Sinus pain start 2 weeks, no fever or chills, no covid exposure.  No nausea or vomiting, normal smell  The patient does not have symptoms concerning for COVID-19 infection (fever, chills, cough, or new shortness of breath).    Past Medical History:  Diagnosis Date  . Age-related osteoporosis without current pathological fracture 06/18/2019  . Allergic rhinitis   . Diabetes ([place])   . Dysphasia following cerebral infarction 06/18/2019  . Hyperlipidemia   . Hypertension   . Mixed hyperlipidemia 06/18/2019  . Mixed incontinence 06/18/2019  . Sequelae of poliomyelitis 06/18/2019  . Type 2 diabetes mellitus with other specified complication ([place]) 05/21/2503    Past Surgical History:  Procedure Laterality Date  . ABDOMINAL HYSTERECTOMY    . CATARACT EXTRACTION Right   . CHOLECYSTECTOMY    . ROTATOR  CUFF REPAIR Bilateral     No family history on file.  Social History   Socioeconomic History  . Marital status: Widowed    Spouse name: Not on file  . Number of children: Not on file  . Years of education: Not on file  . Highest education level: Not on file  Occupational History  . Occupation: Disabled  Tobacco Use  . Smoking status: Former Smoker    Packs/day: 1.00    Years: 5.00    Pack years: 5.00    Types: Cigarettes    Quit date: 03/16/1987    Years since quitting: 32.9  . Smokeless tobacco: Never Used  Substance and Sexual Activity  . Alcohol use: No  . Drug use: No  . Sexual activity: Not Currently  Other Topics Concern  . Not on file  Social History Narrative  . Not on file   Social Determinants of Health   Financial Resource Strain: Not on file  Food Insecurity: Not on file  Transportation Needs: Not on file  Physical Activity: Not on file  Stress: Not on file  Social Connections: Not on file  Intimate Partner Violence: Not on file    Outpatient Medications Prior to Visit  Medication Sig Dispense Refill  . alendronate (FOSAMAX) 70 MG tablet TAKE 1 TABLET BY MOUTH WEEKLY ON THE SAME DAY EACH WEEK, WITH A FULL GLASS OF WATER AT LEAST 30 MINUTES BEFORE FIRST FOOD OR BEVERAGE OF THE DAY. REMAIN UPRIGHT FOR AT LEAST 30 MINUTES AFTER TAKING 4 tablet 6  . azelastine (ASTELIN) 0.1 % nasal  spray USE 1 SPRAY IN EACH NOSTRIL TWICE DAILY 30 mL 6  . BD PEN NEEDLE NANO U/F 32G X 4 MM MISC See admin instructions.    . benzonatate (TESSALON) 100 MG capsule Take 1 capsule (100 mg total) by mouth 2 (two) times daily as needed for cough. 20 capsule 2  . Continuous Blood Gluc Receiver (FREESTYLE LIBRE 2 READER) DEVI 1 each by Does not apply route in the morning, at noon, and at bedtime. 1 each 0  . Continuous Blood Gluc Sensor (FREESTYLE LIBRE 2 SENSOR) MISC 1 each by Does not apply route every 14 (fourteen) days. 2 each 6  . esomeprazole (NEXIUM) 40 MG capsule Take 40 mg by  mouth daily before breakfast.    . estradiol (ESTRACE) 0.1 MG/GM vaginal cream Place 1 Applicatorful vaginally 3 (three) times a week. 42.5 g 3  . ezetimibe (ZETIA) 10 MG tablet TAKE 1 TABLET BY MOUTH DAILY 30 tablet 6  . fluticasone (FLONASE) 50 MCG/ACT nasal spray Place 1 spray into both nostrils daily. 16 mL 6  . gabapentin (NEURONTIN) 300 MG capsule Take 1 capsule (300 mg total) by mouth 3 (three) times daily. 270 capsule 2  . glimepiride (AMARYL) 1 MG tablet Take 1 tablet (1 mg total) by mouth daily before breakfast. 90 tablet 2  . insulin detemir (LEVEMIR) 100 UNIT/ML injection Inject 20 Units into the skin at bedtime.    . lansoprazole (PREVACID) 30 MG capsule Take 1 capsule (30 mg total) by mouth daily at 12 noon. 90 capsule 2  . LEVEMIR FLEXTOUCH 100 UNIT/ML FlexPen INJECT 12 UNITS SUBCUTANEOUSLY EVERY NIGHT AT BEDTIME 15 mL 3  . liraglutide (VICTOZA) 18 MG/3ML SOPN Inject 0.2 mLs (1.2 mg total) into the skin daily. 18 mL 6  . lisinopril (PRINIVIL,ZESTRIL) 20 MG tablet Take 20 mg by mouth daily.    . metoprolol succinate (TOPROL-XL) 25 MG 24 hr tablet     . metoprolol tartrate (LOPRESSOR) 25 MG tablet TAKE 1 TABLET BY MOUTH 2 TIMES A DAY 180 tablet 2  . nabumetone (RELAFEN) 500 MG tablet Take 500 mg by mouth 2 (two) times daily.    . pantoprazole (PROTONIX) 40 MG tablet TAKE 1 TABLET BY MOUTH ONCE DAILY 30 tablet 6  . rosuvastatin (CRESTOR) 40 MG tablet TAKE 1 TABLET BY MOUTH EVERY NIGHT AT BEDTIME 30 tablet 6  . Cephalexin 500 MG tablet Take 1 tablet (500 mg total) by mouth at bedtime. 30 tablet 1   No facility-administered medications prior to visit.    Allergies:   Nitrofuran derivatives, Codeine, Morphine and related, and Sulfa antibiotics   Social History   Tobacco Use  . Smoking status: Former Smoker    Packs/day: 1.00    Years: 5.00    Pack years: 5.00    Types: Cigarettes    Quit date: 03/16/1987    Years since quitting: 32.9  . Smokeless tobacco: Never Used   Substance Use Topics  . Alcohol use: No  . Drug use: No     Review of Systems  Constitutional: Negative for chills, fever and malaise/fatigue.  HENT: Positive for congestion, sinus pain and sore throat.   Eyes: Negative for redness.  Respiratory: Positive for cough. Negative for hemoptysis, sputum production and shortness of breath.   Cardiovascular: Negative for chest pain.  Gastrointestinal: Negative for nausea and vomiting.  Genitourinary: Negative for dysuria and urgency.  Musculoskeletal: Negative for myalgias and neck pain.  Neurological: Negative for dizziness and headaches.  Labs/Other Tests and Data Reviewed:    Recent Labs: 12/24/2019: ALT 19; BUN 13; Creatinine, Ser 1.06; Hemoglobin 14.3; Platelets 222; Potassium 4.6; Sodium 142   Recent Lipid Panel Lab Results  Component Value Date/Time   CHOL 167 12/24/2019 11:04 AM   TRIG 144 12/24/2019 11:04 AM   HDL 60 12/24/2019 11:04 AM   CHOLHDL 2.8 12/24/2019 11:04 AM   LDLCALC 82 12/24/2019 11:04 AM    Wt Readings from Last 3 Encounters:  02/28/20 146 lb (66.2 kg)  12/24/19 147 lb 9.6 oz (67 kg)  11/16/19 145 lb (65.8 kg)     Objective:    Vital Signs:  BP 112/68   Pulse 69   Temp 98 F (36.7 C)   Ht 5' 0.5" (1.537 m)   Wt 146 lb (66.2 kg)   BMI 28.04 kg/m    Physical Exam vs reviewed  ASSESSMENT & PLAN:   Diagnoses and all orders for this visit: Acute non-recurrent sinusitis, unspecified location -     cephALEXin (KEFLEX) 500 MG capsule; Take 1 capsule (500 mg total) by mouth 2 (two) times daily. Patient has sinusitis and will be treated.       COVID-19 Education: The signs and symptoms of COVID-19 were discussed with the patient and how to seek care for testing (follow up with PCP or arrange E-visit). The importance of social distancing was discussed today.   I spent 20 minutes dedicated to the care of this patient on the date of this encounter to include face-to-face time with the patient,  as well as: discuss antibiotics  Follow Up:  In Person prn  Signed,  Reinaldo Meeker, MD  02/28/2020 12:06 PM    [place]

## 2020-03-02 DIAGNOSIS — J0100 Acute maxillary sinusitis, unspecified: Secondary | ICD-10-CM | POA: Insufficient documentation

## 2020-03-02 DIAGNOSIS — J019 Acute sinusitis, unspecified: Secondary | ICD-10-CM | POA: Insufficient documentation

## 2020-03-03 ENCOUNTER — Telehealth: Payer: Self-pay

## 2020-03-03 ENCOUNTER — Other Ambulatory Visit: Payer: Self-pay

## 2020-03-03 MED ORDER — BENZONATATE 100 MG PO CAPS: 200.0000 mg | ORAL_CAPSULE | Freq: Two times a day (BID) | ORAL | 2 refills | Status: DC | PRN

## 2020-03-03 NOTE — Telephone Encounter (Signed)
Patient was seen for Dr Henrene Pastor 02/28/2020. She asked for cough medication to be sent to the pharmacy. Please advice.

## 2020-03-03 NOTE — Telephone Encounter (Signed)
Patient was informed.

## 2020-03-03 NOTE — Telephone Encounter (Signed)
Refill tessalon perles. [facility]

## 2020-03-16 DIAGNOSIS — R32 Unspecified urinary incontinence: Secondary | ICD-10-CM | POA: Diagnosis not present

## 2020-03-18 ENCOUNTER — Ambulatory Visit: Payer: Medicare HMO | Admitting: Family Medicine

## 2020-03-18 ENCOUNTER — Other Ambulatory Visit: Payer: Self-pay | Admitting: Legal Medicine

## 2020-03-19 DIAGNOSIS — H43821 Vitreomacular adhesion, right eye: Secondary | ICD-10-CM | POA: Diagnosis not present

## 2020-03-19 DIAGNOSIS — H353131 Nonexudative age-related macular degeneration, bilateral, early dry stage: Secondary | ICD-10-CM | POA: Diagnosis not present

## 2020-03-19 DIAGNOSIS — H04123 Dry eye syndrome of bilateral lacrimal glands: Secondary | ICD-10-CM | POA: Diagnosis not present

## 2020-03-19 LAB — HM DIABETES EYE EXAM

## 2020-03-27 ENCOUNTER — Other Ambulatory Visit: Payer: Self-pay

## 2020-03-27 ENCOUNTER — Encounter: Payer: Self-pay | Admitting: Legal Medicine

## 2020-03-27 ENCOUNTER — Ambulatory Visit (INDEPENDENT_AMBULATORY_CARE_PROVIDER_SITE_OTHER): Payer: Medicare HMO | Admitting: Legal Medicine

## 2020-03-27 VITALS — BP 124/70 | HR 53 | Temp 97.4°F | Resp 16 | Ht 60.5 in | Wt 146.0 lb

## 2020-03-27 DIAGNOSIS — G8929 Other chronic pain: Secondary | ICD-10-CM | POA: Diagnosis not present

## 2020-03-27 DIAGNOSIS — I708 Atherosclerosis of other arteries: Secondary | ICD-10-CM | POA: Diagnosis not present

## 2020-03-27 DIAGNOSIS — M48061 Spinal stenosis, lumbar region without neurogenic claudication: Secondary | ICD-10-CM | POA: Diagnosis not present

## 2020-03-27 DIAGNOSIS — M47816 Spondylosis without myelopathy or radiculopathy, lumbar region: Secondary | ICD-10-CM | POA: Diagnosis not present

## 2020-03-27 DIAGNOSIS — M4726 Other spondylosis with radiculopathy, lumbar region: Secondary | ICD-10-CM

## 2020-03-27 NOTE — Patient Instructions (Signed)

## 2020-03-27 NOTE — Progress Notes (Signed)
Subjective:  Patient ID: Rebecca Hodge, female    DOB: 06-23-1940  Age: 80 y.o. MRN: [id number]  Chief Complaint  Patient presents with  . Back Pain    Since 2 weeks ago sharp pain on the back with radiation to left thigh    HPI: woke up 2 weeks ago with back pain, radiated pain on left side.  No injury, Dr. Joya Salm performed surgery on 2010 for back. Leg swollen on left, not painful.  No bowel or bladder problems since this started/   Current Outpatient Medications on File Prior to Visit  Medication Sig Dispense Refill  . alendronate (FOSAMAX) 70 MG tablet TAKE 1 TABLET BY MOUTH WEEKLY ON THE SAME DAY EACH WEEK, WITH A FULL GLASS OF WATER AT LEAST 30 MINUTES BEFORE FIRST FOOD OR BEVERAGE OF THE DAY. REMAIN UPRIGHT FOR AT LEAST 30 MINUTES AFTER TAKING 4 tablet 6  . azelastine (ASTELIN) 0.1 % nasal spray USE 1 SPRAY IN EACH NOSTRIL TWICE DAILY 30 mL 6  . BD PEN NEEDLE NANO U/F 32G X 4 MM MISC See admin instructions.    . benzonatate (TESSALON) 100 MG capsule Take 2 capsules (200 mg total) by mouth 2 (two) times daily as needed for cough. 20 capsule 2  . cephALEXin (KEFLEX) 500 MG capsule Take 1 capsule (500 mg total) by mouth 2 (two) times daily. 20 capsule 0  . Continuous Blood Gluc Receiver (FREESTYLE LIBRE 2 READER) DEVI 1 each by Does not apply route in the morning, at noon, and at bedtime. 1 each 0  . Continuous Blood Gluc Sensor (FREESTYLE LIBRE 2 SENSOR) MISC 1 each by Does not apply route every 14 (fourteen) days. 2 each 6  . esomeprazole (NEXIUM) 40 MG capsule Take 40 mg by mouth daily before breakfast.    . estradiol (ESTRACE) 0.1 MG/GM vaginal cream Place 1 Applicatorful vaginally 3 (three) times a week. 42.5 g 3  . ezetimibe (ZETIA) 10 MG tablet TAKE 1 TABLET BY MOUTH DAILY 30 tablet 6  . fluticasone (FLONASE) 50 MCG/ACT nasal spray Place 1 spray into both nostrils daily. 16 mL 6  . gabapentin (NEURONTIN) 300 MG capsule Take 1 capsule (300 mg total) by mouth 3 (three) times  daily. 270 capsule 2  . glimepiride (AMARYL) 1 MG tablet Take 1 tablet (1 mg total) by mouth daily before breakfast. 90 tablet 2  . insulin detemir (LEVEMIR) 100 UNIT/ML injection Inject 20 Units into the skin at bedtime.    . lansoprazole (PREVACID) 30 MG capsule Take 1 capsule (30 mg total) by mouth daily at 12 noon. 90 capsule 2  . LEVEMIR FLEXTOUCH 100 UNIT/ML FlexPen INJECT 12 UNITS SUBCUTANEOUSLY EVERY NIGHT AT BEDTIME 15 mL 3  . liraglutide (VICTOZA) 18 MG/3ML SOPN Inject 0.2 mLs (1.2 mg total) into the skin daily. 18 mL 6  . lisinopril (PRINIVIL,ZESTRIL) 20 MG tablet Take 20 mg by mouth daily.    . metoprolol succinate (TOPROL-XL) 25 MG 24 hr tablet     . metoprolol tartrate (LOPRESSOR) 25 MG tablet TAKE 1 TABLET BY MOUTH 2 TIMES A DAY 180 tablet 2  . nabumetone (RELAFEN) 500 MG tablet Take 500 mg by mouth 2 (two) times daily.    . pantoprazole (PROTONIX) 40 MG tablet TAKE 1 TABLET BY MOUTH ONCE DAILY 30 tablet 6  . rosuvastatin (CRESTOR) 40 MG tablet TAKE 1 TABLET BY MOUTH EVERY NIGHT AT BEDTIME 30 tablet 6   No current facility-administered medications on file prior to visit.  Past Medical History:  Diagnosis Date  . Age-related osteoporosis without current pathological fracture 06/18/2019  . Allergic rhinitis   . Diabetes (HCC)   . Dysphasia following cerebral infarction 06/18/2019  . Hyperlipidemia   . Hypertension   . Mixed hyperlipidemia 06/18/2019  . Mixed incontinence 06/18/2019  . Sequelae of poliomyelitis 06/18/2019  . Type 2 diabetes mellitus with other specified complication (HCC) 06/18/2019   Past Surgical History:  Procedure Laterality Date  . ABDOMINAL HYSTERECTOMY    . CATARACT EXTRACTION Right   . CHOLECYSTECTOMY    . ROTATOR CUFF REPAIR Bilateral     History reviewed. No pertinent family history. Social History   Socioeconomic History  . Marital status: Widowed    Spouse name: Not on file  . Number of children: Not on file  . Years of education: Not on file   . Highest education level: Not on file  Occupational History  . Occupation: Disabled  Tobacco Use  . Smoking status: Former Smoker    Packs/day: 1.00    Years: 5.00    Pack years: 5.00    Types: Cigarettes    Quit date: 03/16/1987    Years since quitting: 33.0  . Smokeless tobacco: Never Used  Substance and Sexual Activity  . Alcohol use: No  . Drug use: No  . Sexual activity: Not Currently  Other Topics Concern  . Not on file  Social History Narrative  . Not on file   Social Determinants of Health   Financial Resource Strain: Not on file  Food Insecurity: Not on file  Transportation Needs: Not on file  Physical Activity: Not on file  Stress: Not on file  Social Connections: Not on file    Review of Systems  Constitutional: Negative.   HENT: Negative for congestion and sinus pain.   Respiratory: Negative.  Negative for apnea and chest tightness.   Cardiovascular: Negative.  Negative for chest pain, palpitations and leg swelling.  Gastrointestinal: Negative for abdominal distention and abdominal pain.  Genitourinary: Negative.  Negative for difficulty urinating and dysuria.  Musculoskeletal: Positive for back pain.  Skin: Negative.   Neurological: Negative for numbness.  Hematological: Negative.   Psychiatric/Behavioral: Negative.      Objective:  BP 124/70   Pulse (!) 53   Temp (!) 97.4 F (36.3 C)   Resp 16   Ht 5' 0.5" (1.537 m)   Wt 146 lb (66.2 kg)   SpO2 93%   BMI 28.04 kg/m   BP/Weight 03/27/2020 02/28/2020 12/24/2019  Systolic BP 124 112 120  Diastolic BP 70 68 60  Wt. (Lbs) 146 146 147.6  BMI 28.04 28.04 24.56    Physical Exam Constitutional:      Appearance: Normal appearance.  HENT:     Right Ear: Tympanic membrane, ear canal and external ear normal.     Left Ear: Tympanic membrane, ear canal and external ear normal.  Eyes:     Extraocular Movements: Extraocular movements intact.     Conjunctiva/sclera: Conjunctivae normal.      Pupils: Pupils are equal, round, and reactive to light.  Cardiovascular:     Rate and Rhythm: Normal rate and regular rhythm.     Pulses: Normal pulses.     Heart sounds: Normal heart sounds.  Pulmonary:     Effort: Pulmonary effort is normal.     Breath sounds: Normal breath sounds.  Musculoskeletal:        General: Tenderness present.     Lumbar back: Spasms and tenderness  present. No swelling, edema, deformity, signs of trauma, lacerations or bony tenderness. Decreased range of motion. Negative right straight leg raise test and negative left straight leg raise test. No scoliosis.       Back:     Comments: Limited back movement.  Skin:    Capillary Refill: Capillary refill takes less than 2 seconds.  Neurological:     General: No focal deficit present.     Mental Status: She is alert and oriented to person, place, and time.       Lab Results  Component Value Date   WBC 7.3 12/24/2019   HGB 14.3 12/24/2019   HCT 44.4 12/24/2019   PLT 222 12/24/2019   GLUCOSE 208 (H) 12/24/2019   CHOL 167 12/24/2019   TRIG 144 12/24/2019   HDL 60 12/24/2019   LDLCALC 82 12/24/2019   ALT 19 12/24/2019   AST 24 12/24/2019   NA 142 12/24/2019   K 4.6 12/24/2019   CL 105 12/24/2019   CREATININE 1.06 (H) 12/24/2019   BUN 13 12/24/2019   CO2 26 12/24/2019   HGBA1C 7.9 (H) 12/24/2019   MICROALBUR 30 06/25/2019      Assessment & Plan:   Diagnoses and all orders for this visit: Other spondylosis with radiculopathy, lumbar region -     DG Lumbar Spine Complete -     AMB referral to orthopedics Patient has lbp after back surgery .  I will give ibuprofen, heat, x-ray back        I spent 20 minutes dedicated to the care of this patient on the date of this encounter to include face-to-face time with the patient, as well as: review of old surgicl fecords  Follow-up: Return if symptoms worsen or fail to improve.  An After Visit Summary was printed and given to the  patient.  Reinaldo Meeker, MD [facility] [phone]

## 2020-04-09 ENCOUNTER — Other Ambulatory Visit: Payer: Self-pay | Admitting: Legal Medicine

## 2020-04-09 DIAGNOSIS — E1169 Type 2 diabetes mellitus with other specified complication: Secondary | ICD-10-CM

## 2020-04-09 DIAGNOSIS — Z794 Long term (current) use of insulin: Secondary | ICD-10-CM

## 2020-04-16 DIAGNOSIS — R32 Unspecified urinary incontinence: Secondary | ICD-10-CM | POA: Diagnosis not present

## 2020-04-25 ENCOUNTER — Ambulatory Visit: Payer: Medicare HMO | Admitting: Legal Medicine

## 2020-04-25 DIAGNOSIS — M545 Low back pain, unspecified: Secondary | ICD-10-CM | POA: Diagnosis not present

## 2020-05-06 ENCOUNTER — Other Ambulatory Visit: Payer: Self-pay | Admitting: Legal Medicine

## 2020-05-06 DIAGNOSIS — E1169 Type 2 diabetes mellitus with other specified complication: Secondary | ICD-10-CM

## 2020-05-06 DIAGNOSIS — Z794 Long term (current) use of insulin: Secondary | ICD-10-CM

## 2020-05-06 DIAGNOSIS — E1159 Type 2 diabetes mellitus with other circulatory complications: Secondary | ICD-10-CM

## 2020-05-12 ENCOUNTER — Other Ambulatory Visit: Payer: Self-pay | Admitting: Legal Medicine

## 2020-05-13 DIAGNOSIS — R32 Unspecified urinary incontinence: Secondary | ICD-10-CM | POA: Diagnosis not present

## 2020-06-03 ENCOUNTER — Ambulatory Visit (INDEPENDENT_AMBULATORY_CARE_PROVIDER_SITE_OTHER): Payer: Medicare HMO

## 2020-06-03 ENCOUNTER — Other Ambulatory Visit: Payer: Self-pay

## 2020-06-03 ENCOUNTER — Ambulatory Visit (INDEPENDENT_AMBULATORY_CARE_PROVIDER_SITE_OTHER): Payer: Medicare HMO | Admitting: Legal Medicine

## 2020-06-03 ENCOUNTER — Encounter: Payer: Self-pay | Admitting: Legal Medicine

## 2020-06-03 VITALS — BP 118/60 | HR 54 | Temp 97.5°F | Resp 16 | Ht 60.5 in | Wt 146.2 lb

## 2020-06-03 DIAGNOSIS — Z794 Long term (current) use of insulin: Secondary | ICD-10-CM

## 2020-06-03 DIAGNOSIS — E1169 Type 2 diabetes mellitus with other specified complication: Secondary | ICD-10-CM | POA: Diagnosis not present

## 2020-06-03 DIAGNOSIS — F339 Major depressive disorder, recurrent, unspecified: Secondary | ICD-10-CM

## 2020-06-03 DIAGNOSIS — Z23 Encounter for immunization: Secondary | ICD-10-CM

## 2020-06-03 DIAGNOSIS — R300 Dysuria: Secondary | ICD-10-CM | POA: Diagnosis not present

## 2020-06-03 DIAGNOSIS — G479 Sleep disorder, unspecified: Secondary | ICD-10-CM | POA: Diagnosis not present

## 2020-06-03 LAB — POCT URINALYSIS DIP (CLINITEK)
Bilirubin, UA: NEGATIVE
Blood, UA: NEGATIVE
Glucose, UA: 250 mg/dL — AB
Ketones, POC UA: NEGATIVE mg/dL
Leukocytes, UA: NEGATIVE
Nitrite, UA: NEGATIVE
POC PROTEIN,UA: NEGATIVE
Spec Grav, UA: 1.025 (ref 1.010–1.025)
Urobilinogen, UA: 0.2 E.U./dL
pH, UA: 5 (ref 5.0–8.0)

## 2020-06-03 MED ORDER — MIRTAZAPINE 15 MG PO TABS: 15.0000 mg | ORAL_TABLET | Freq: Every day | ORAL | 3 refills | Status: DC

## 2020-06-03 MED ORDER — ONETOUCH VERIO REFLECT W/DEVICE KIT: 1.0000 | PACK | Freq: Every day | 0 refills | Status: DC

## 2020-06-03 NOTE — Progress Notes (Signed)
Subjective:  Patient ID: Rebecca Hodge, female    DOB: Nov 21, 1940  Age: 80 y.o. MRN: [id number]  Chief Complaint  Patient presents with  . Dysuria    Patient states it burns when she urinates and complains of lower back and abdominal pain.   Marland Kitchen skin lesion    Patient states she has a lesion on the L side of chest that is painful at times, has been present for a week.Denies bleeding of lesion.    HPI: patient is irritable and sleeps poorly.  Sleeps during day.  No particular stress. She is a problem for caregiver. Her urine is dark at times, no pain  She has a new lesion on left chest wall, keratotic , not enlarging.   Current Outpatient Medications on File Prior to Visit  Medication Sig Dispense Refill  . alendronate (FOSAMAX) 70 MG tablet TAKE 1 TABLET BY MOUTH WEEKLY ON THE SAME DAY EACH WEEK, WITH A FULL GLASS OF WATER AT LEAST 30 MINUTES BEFORE FIRST FOOD OR BEVERAGE OF THE DAY. REMAIN UPRIGHT FOR AT LEAST 30 MINUTES AFTER TAKING 4 tablet 6  . azelastine (ASTELIN) 0.1 % nasal spray USE 1 SPRAY IN EACH NOSTRIL TWICE DAILY 30 mL 6  . BD PEN NEEDLE NANO U/F 32G X 4 MM MISC See admin instructions.    . benzonatate (TESSALON) 100 MG capsule Take 2 capsules (200 mg total) by mouth 2 (two) times daily as needed for cough. 20 capsule 2  . diclofenac (VOLTAREN) 75 MG EC tablet     . esomeprazole (NEXIUM) 40 MG capsule Take 40 mg by mouth daily before breakfast.    . estradiol (ESTRACE) 0.1 MG/GM vaginal cream Place 1 Applicatorful vaginally 3 (three) times a week. 42.5 g 3  . ezetimibe (ZETIA) 10 MG tablet TAKE 1 TABLET BY MOUTH DAILY 30 tablet 6  . fluticasone (FLONASE) 50 MCG/ACT nasal spray Place 1 spray into both nostrils daily. 16 mL 6  . gabapentin (NEURONTIN) 300 MG capsule TAKE 1 CAPSULE BY MOUTH 3 TIMES A DAY 90 capsule 6  . glimepiride (AMARYL) 1 MG tablet     . insulin detemir (LEVEMIR) 100 UNIT/ML injection Inject 20 Units into the skin at bedtime.    . lansoprazole (PREVACID)  30 MG capsule Take 1 capsule (30 mg total) by mouth daily at 12 noon. 90 capsule 2  . LEVEMIR FLEXTOUCH 100 UNIT/ML FlexPen INJECT 12 UNITS SUBCUTANEOUSLY EVERY NIGHT AT BEDTIME 15 mL 3  . liraglutide (VICTOZA) 18 MG/3ML SOPN Inject 0.2 mLs (1.2 mg total) into the skin daily. 18 mL 6  . lisinopril (PRINIVIL,ZESTRIL) 20 MG tablet Take 20 mg by mouth daily.    . metoprolol succinate (TOPROL-XL) 25 MG 24 hr tablet     . metoprolol tartrate (LOPRESSOR) 25 MG tablet TAKE 1 TABLET BY MOUTH 2 TIMES A DAY 180 tablet 2  . nabumetone (RELAFEN) 500 MG tablet Take 500 mg by mouth 2 (two) times daily.    . pantoprazole (PROTONIX) 40 MG tablet TAKE 1 TABLET BY MOUTH ONCE DAILY 30 tablet 6  . rosuvastatin (CRESTOR) 40 MG tablet TAKE 1 TABLET BY MOUTH EVERY NIGHT AT BEDTIME 30 tablet 6   No current facility-administered medications on file prior to visit.   Past Medical History:  Diagnosis Date  . Age-related osteoporosis without current pathological fracture 06/18/2019  . Allergic rhinitis   . Diabetes ([place])   . Dysphasia following cerebral infarction 06/18/2019  . Hyperlipidemia   . Hypertension   .  Mixed hyperlipidemia 06/18/2019  . Mixed incontinence 06/18/2019  . Sequelae of poliomyelitis 06/18/2019  . Type 2 diabetes mellitus with other specified complication ([place]) 04/17/3005   Past Surgical History:  Procedure Laterality Date  . ABDOMINAL HYSTERECTOMY    . CATARACT EXTRACTION Right   . CHOLECYSTECTOMY    . ROTATOR CUFF REPAIR Bilateral     History reviewed. No pertinent family history. Social History   Socioeconomic History  . Marital status: Widowed    Spouse name: Not on file  . Number of children: Not on file  . Years of education: Not on file  . Highest education level: Not on file  Occupational History  . Occupation: Disabled  Tobacco Use  . Smoking status: Former Smoker    Packs/day: 1.00    Years: 5.00    Pack years: 5.00    Types: Cigarettes    Quit date: 03/16/1987    Years since  quitting: 33.2  . Smokeless tobacco: Never Used  Substance and Sexual Activity  . Alcohol use: No  . Drug use: No  . Sexual activity: Not Currently  Other Topics Concern  . Not on file  Social History Narrative  . Not on file   Social Determinants of Health   Financial Resource Strain: Not on file  Food Insecurity: Not on file  Transportation Needs: Not on file  Physical Activity: Not on file  Stress: Not on file  Social Connections: Not on file    Review of Systems  Constitutional: Negative for activity change and appetite change.  HENT: Negative for congestion and sinus pain.   Eyes: Negative for visual disturbance.  Respiratory: Negative for chest tightness and shortness of breath.   Cardiovascular: Negative for chest pain, palpitations and leg swelling.  Gastrointestinal: Negative for abdominal distention and abdominal pain.  Endocrine: Negative for polyuria.  Genitourinary: Negative for difficulty urinating and urgency.  Musculoskeletal: Negative for arthralgias.  Skin: Negative.   Neurological: Negative.   Psychiatric/Behavioral: Negative.      Objective:  BP 118/60 (BP Location: Right Arm, Patient Position: Sitting, Cuff Size: Normal)   Pulse (!) 54   Temp (!) 97.5 F (36.4 C) (Temporal)   Resp 16   Ht 5' 0.5" (1.537 m)   Wt 146 lb 3.2 oz (66.3 kg)   SpO2 94%   BMI 28.08 kg/m   BP/Weight 06/03/2020 03/27/2020 62/26/3335  Systolic BP 456 256 389  Diastolic BP 60 70 68  Wt. (Lbs) 146.2 146 146  BMI 28.08 28.04 28.04    Physical Exam Vitals reviewed.  Constitutional:      Appearance: Normal appearance.  HENT:     Right Ear: Tympanic membrane normal.     Left Ear: Tympanic membrane normal.     Mouth/Throat:     Mouth: Mucous membranes are moist.     Pharynx: Oropharynx is clear.  Eyes:     Extraocular Movements: Extraocular movements intact.     Conjunctiva/sclera: Conjunctivae normal.     Pupils: Pupils are equal, round, and reactive to light.   Cardiovascular:     Rate and Rhythm: Normal rate and regular rhythm.     Pulses: Normal pulses.     Heart sounds: Normal heart sounds. No murmur heard. No gallop.   Pulmonary:     Effort: Pulmonary effort is normal. No respiratory distress.     Breath sounds: Normal breath sounds. No rales.  Abdominal:     General: Abdomen is flat. Bowel sounds are normal.  Palpations: Abdomen is soft.  Musculoskeletal:        General: Normal range of motion.     Cervical back: Normal range of motion and neck supple.  Skin:    General: Skin is warm.     Capillary Refill: Capillary refill takes less than 2 seconds.  Neurological:     General: No focal deficit present.     Mental Status: She is alert and oriented to person, place, and time.    Depression screen [facility] 2/9 06/03/2020 06/25/2019  Decreased Interest 3 1  Down, Depressed, Hopeless 0 0  PHQ - 2 Score 3 1  Altered sleeping 3 0  Tired, decreased energy 2 0  Change in appetite 3 0  Feeling bad or failure about yourself  0 0  Trouble concentrating 0 0  Moving slowly or fidgety/restless 0 2  Suicidal thoughts 0 0  PHQ-9 Score 11 3  Difficult doing work/chores Somewhat difficult -       Lab Results  Component Value Date   WBC 7.3 12/24/2019   HGB 14.3 12/24/2019   HCT 44.4 12/24/2019   PLT 222 12/24/2019   GLUCOSE 208 (H) 12/24/2019   CHOL 167 12/24/2019   TRIG 144 12/24/2019   HDL 60 12/24/2019   LDLCALC 82 12/24/2019   ALT 19 12/24/2019   AST 24 12/24/2019   NA 142 12/24/2019   K 4.6 12/24/2019   CL 105 12/24/2019   CREATININE 1.06 (H) 12/24/2019   BUN 13 12/24/2019   CO2 26 12/24/2019   HGBA1C 7.9 (H) 12/24/2019   MICROALBUR 30 06/25/2019      Assessment & Plan:   Diagnoses and all orders for this visit: Dysuria -     POCT URINALYSIS DIP (CLINITEK) Urinalysis is normal  Type 2 diabetes mellitus with other specified complication, with long-term current use of insulin (HCC) -     POCT Glucose (Device for  Home Use) -     Blood Glucose Monitoring Suppl (ONETOUCH VERIO REFLECT) w/Device KIT; 1 each by Does not apply route daily. An individual care plan for diabetes was established and reinforced today.  The patient's status was assessed using clinical findings on exam, labs and diagnostic testing. Patient success at meeting goals based on disease specific evidence-based guidelines and found to be good controlled. Medications were assessed and patient's understanding of the medical issues , including barriers were assessed. Recommend adherence to a diabetic diet, a graduated exercise program, HgbA1c level is checked quarterly, and urine microalbumin performed yearly .  Annual mono-filament sensation testing performed. Lower blood pressure and control hyperlipidemia is important. Get annual eye exams and annual flu shots and smoking cessation discussed.  Self management goals were discussed.  Depression, recurrent ([place]) -     mirtazapine (REMERON) 15 MG tablet; Take 1 tablet (15 mg total) by mouth at bedtime. Patient's depression is uncontrolled with no medicines.   Anhedonia worse.  PHQ 9 was performed score 11. An individual care plan was established or reinforced today.  The patient's disease status was assessed using clinical findings on exam, labs, and or other diagnostic testing to determine patient's success in meeting treatment goals based on disease specific evidence-based guidelines and found to be worsening.  She is very moody Recommendations include start remiron  Need for COVID-19 vaccine -     Moderna Covid-19 Booster  Sleep disorder Start Remiron  Squamous cell Follow up for lesion removal      Orders Placed This Encounter  Procedures  .  Moderna Covid-19 Booster  . POCT URINALYSIS DIP (CLINITEK)  . POCT Glucose (Device for Home Use)      I spent 25 minutes dedicated to the care of this patient on the date of this encounter to include face-to-face time with the patient, as  well as:   Follow-up: Return in 1 month (on 07/04/2020), or for chronic and separate visit 53mnutes for lesion removal.  An After Visit Summary was printed and given to the patient.  LReinaldo Meeker MD [facility] [phone]

## 2020-06-04 ENCOUNTER — Other Ambulatory Visit: Payer: Self-pay | Admitting: Legal Medicine

## 2020-06-04 DIAGNOSIS — Z794 Long term (current) use of insulin: Secondary | ICD-10-CM

## 2020-06-04 DIAGNOSIS — E1169 Type 2 diabetes mellitus with other specified complication: Secondary | ICD-10-CM

## 2020-06-04 DIAGNOSIS — E1159 Type 2 diabetes mellitus with other circulatory complications: Secondary | ICD-10-CM

## 2020-06-04 DIAGNOSIS — R1084 Generalized abdominal pain: Secondary | ICD-10-CM

## 2020-06-04 DIAGNOSIS — E782 Mixed hyperlipidemia: Secondary | ICD-10-CM

## 2020-06-04 NOTE — Progress Notes (Signed)
   Covid-19 Vaccination Clinic  Name:  Rebecca Hodge    MRN: [id number] DOB: [birth year]  06/04/2020  Rebecca Hodge was observed post Covid-19 immunization for 15 minutes without incident. She was provided with Vaccine Information Sheet and instruction to access the V-Safe system.   Rebecca Hodge was instructed to call 911 with any severe reactions post vaccine: Marland Kitchen Difficulty breathing  . Swelling of face and throat  . A fast heartbeat  . A bad rash all over body  . Dizziness and weakness   Immunizations Administered    Name Date Dose VIS Date Route   Moderna Covid-19 Booster Vaccine 06/03/2020 10:30 AM 0.25 mL 01/02/2020 Intramuscular   Manufacturer: Moderna   Lot: [id number]   [place]: [id number]

## 2020-06-05 ENCOUNTER — Other Ambulatory Visit: Payer: Self-pay

## 2020-06-05 DIAGNOSIS — E1169 Type 2 diabetes mellitus with other specified complication: Secondary | ICD-10-CM

## 2020-06-05 MED ORDER — ONETOUCH ULTRASOFT LANCETS MISC: 1.0000 | Freq: Every day | 2 refills | Status: DC

## 2020-06-05 MED ORDER — GLUCOSE BLOOD VI STRP: 1.0000 | ORAL_STRIP | Freq: Every day | 2 refills | Status: DC

## 2020-06-06 ENCOUNTER — Other Ambulatory Visit: Payer: Self-pay

## 2020-06-06 ENCOUNTER — Other Ambulatory Visit: Payer: Medicare HMO

## 2020-06-09 ENCOUNTER — Other Ambulatory Visit: Payer: Self-pay

## 2020-06-09 DIAGNOSIS — E1169 Type 2 diabetes mellitus with other specified complication: Secondary | ICD-10-CM

## 2020-06-09 MED ORDER — GLUCOSE BLOOD VI STRP: 1.0000 | ORAL_STRIP | Freq: Every day | 2 refills | Status: DC

## 2020-06-09 MED ORDER — ACCU-CHEK GUIDE W/DEVICE KIT: 1.0000 | PACK | Freq: Every day | 0 refills | Status: DC

## 2020-06-09 MED ORDER — ONETOUCH VERIO REFLECT W/DEVICE KIT: 1.0000 | PACK | Freq: Every day | 0 refills | Status: DC

## 2020-06-09 MED ORDER — ACCU-CHEK GUIDE VI STRP: 1.0000 | ORAL_STRIP | Freq: Every day | 2 refills | Status: DC

## 2020-06-13 DIAGNOSIS — R32 Unspecified urinary incontinence: Secondary | ICD-10-CM | POA: Diagnosis not present

## 2020-07-02 ENCOUNTER — Ambulatory Visit: Payer: Medicare HMO | Admitting: Legal Medicine

## 2020-07-04 ENCOUNTER — Ambulatory Visit: Payer: Medicare HMO | Admitting: Legal Medicine

## 2020-07-13 DIAGNOSIS — R32 Unspecified urinary incontinence: Secondary | ICD-10-CM | POA: Diagnosis not present

## 2020-07-21 ENCOUNTER — Other Ambulatory Visit: Payer: Self-pay

## 2020-07-21 ENCOUNTER — Encounter: Payer: Self-pay | Admitting: Legal Medicine

## 2020-07-21 ENCOUNTER — Ambulatory Visit (INDEPENDENT_AMBULATORY_CARE_PROVIDER_SITE_OTHER): Payer: Medicare HMO | Admitting: Legal Medicine

## 2020-07-21 VITALS — BP 120/84 | HR 62 | Temp 97.4°F | Resp 16 | Ht 60.5 in | Wt 144.0 lb

## 2020-07-21 DIAGNOSIS — E1169 Type 2 diabetes mellitus with other specified complication: Secondary | ICD-10-CM | POA: Diagnosis not present

## 2020-07-21 DIAGNOSIS — E782 Mixed hyperlipidemia: Secondary | ICD-10-CM

## 2020-07-21 DIAGNOSIS — M810 Age-related osteoporosis without current pathological fracture: Secondary | ICD-10-CM

## 2020-07-21 DIAGNOSIS — Z794 Long term (current) use of insulin: Secondary | ICD-10-CM

## 2020-07-21 DIAGNOSIS — N952 Postmenopausal atrophic vaginitis: Secondary | ICD-10-CM

## 2020-07-21 DIAGNOSIS — B91 Sequelae of poliomyelitis: Secondary | ICD-10-CM

## 2020-07-21 DIAGNOSIS — N951 Menopausal and female climacteric states: Secondary | ICD-10-CM

## 2020-07-21 DIAGNOSIS — Z6827 Body mass index (BMI) 27.0-27.9, adult: Secondary | ICD-10-CM

## 2020-07-21 DIAGNOSIS — R300 Dysuria: Secondary | ICD-10-CM

## 2020-07-21 LAB — POCT URINALYSIS DIP (CLINITEK)
Bilirubin, UA: NEGATIVE
Glucose, UA: 100 mg/dL — AB
Ketones, POC UA: NEGATIVE mg/dL
Leukocytes, UA: NEGATIVE
Nitrite, UA: NEGATIVE
Spec Grav, UA: 1.025 (ref 1.010–1.025)
Urobilinogen, UA: 0.2 E.U./dL
pH, UA: 6 (ref 5.0–8.0)

## 2020-07-21 LAB — POCT UA - MICROALBUMIN: Microalbumin Ur, POC: 30 mg/L

## 2020-07-21 NOTE — Progress Notes (Addendum)
Subjective:  Patient ID: Rebecca Hodge, female    DOB: Jul 18, 1940  Age: 80 y.o. MRN: [id number]  Chief Complaint  Patient presents with   Diabetes   Hyperlipidemia   Gastroesophageal Reflux    HPI: chronic visit  Patient present with type 2 diabetes.  Specifically, this is type 2, insulin requiring diabetes, complicated by hypertension and hypercholesterolemia.  Compliance with treatment has been good; patient take medicines as directed, maintains diet and exercise regimen, follows up as directed, and is keeping glucose diary.  Date of  diagnosis 2010.  Depression screen has been performed.Tobacco screen nonsmoker. Current medicines for diabetes levemir12 units qhs, liraglutide 1.2 mg.  Patient is on lisinopril for renal protection and crestor for cholesterol control.  Patient performs foot exams daily and last ophthalmologic exam was no. Patient needs to test glucose TID  Patient presents for follow up of hypertension.  Patient tolerating 2010 well with side effects.  Patient was diagnosed with hypertension 10 so has been treated for hypertension for metoprolol. lisinopril years.Patient is working on maintaining diet and exercise regimen and follows up as directed. Complication include none..   Patient presents with hyperlipidemia.  Compliance with treatment has been good; patient takes medicines as directed, maintains low cholesterol diet, follows up as directed, and maintains exercise regimen.  Patient is using crestor without problems. Current Outpatient Medications on File Prior to Visit  Medication Sig Dispense Refill   ACCU-CHEK GUIDE test strip 1 each by Other route daily. Check blood sugar fasting in the morning daily. 100 each 2   alendronate (FOSAMAX) 70 MG tablet TAKE 1 TABLET BY MOUTH WEEKLY ON THE SAME DAY EACH WEEK, WITH A FULL GLASS OF WATER AT LEAST 30 MINUTES BEFORE FIRST FOOD OR BEVERAGE OF THE DAY. REMAIN UPRIGHT FOR AT LEAST 30 MINUTES AFTER TAKING 4 tablet 6    azelastine (ASTELIN) 0.1 % nasal spray USE 1 SPRAY IN EACH NOSTRIL TWICE DAILY 30 mL 6   BD PEN NEEDLE NANO U/F 32G X 4 MM MISC See admin instructions.     Blood Glucose Monitoring Suppl (ACCU-CHEK GUIDE) w/Device KIT 1 each by Other route daily. 1 kit 0   estradiol (ESTRACE) 0.1 MG/GM vaginal cream Place 1 Applicatorful vaginally 3 (three) times a week. 42.5 g 3   ezetimibe (ZETIA) 10 MG tablet TAKE 1 TABLET BY MOUTH DAILY 30 tablet 6   fluticasone (FLONASE) 50 MCG/ACT nasal spray Place 1 spray into both nostrils daily. 16 mL 6   gabapentin (NEURONTIN) 300 MG capsule TAKE 1 CAPSULE BY MOUTH 3 TIMES A DAY 90 capsule 6   insulin detemir (LEVEMIR) 100 UNIT/ML injection Inject 20 Units into the skin at bedtime.     Lancets (ONETOUCH ULTRASOFT) lancets 1 each by Other route daily. Use as instructed 100 each 2   LEVEMIR FLEXTOUCH 100 UNIT/ML FlexPen INJECT 12 UNITS SUBCUTANEOUSLY EVERY NIGHT AT BEDTIME 15 mL 3   liraglutide (VICTOZA) 18 MG/3ML SOPN Inject 0.2 mLs (1.2 mg total) into the skin daily. 18 mL 6   lisinopril (PRINIVIL,ZESTRIL) 20 MG tablet Take 20 mg by mouth daily.     metoprolol tartrate (LOPRESSOR) 25 MG tablet TAKE 1 TABLET BY MOUTH 2 TIMES A DAY 60 tablet 5   mirtazapine (REMERON) 15 MG tablet Take 1 tablet (15 mg total) by mouth at bedtime. 30 tablet 3   pantoprazole (PROTONIX) 40 MG tablet TAKE 1 TABLET BY MOUTH ONCE DAILY 30 tablet 6   rosuvastatin (CRESTOR) 40 MG tablet TAKE 1  TABLET BY MOUTH EVERY NIGHT AT BEDTIME 30 tablet 6   No current facility-administered medications on file prior to visit.   Past Medical History:  Diagnosis Date   Age-related osteoporosis without current pathological fracture 06/18/2019   Allergic rhinitis    Diabetes ([place])    Dysphasia following cerebral infarction 06/18/2019   Hyperlipidemia    Hypertension    Mixed hyperlipidemia 06/18/2019   Mixed incontinence 06/18/2019   Sequelae of poliomyelitis 06/18/2019   Type 2 diabetes mellitus with other  specified complication ([place]) 0/11/9831   Past Surgical History:  Procedure Laterality Date   ABDOMINAL HYSTERECTOMY     CATARACT EXTRACTION Right    CHOLECYSTECTOMY     ROTATOR CUFF REPAIR Bilateral     History reviewed. No pertinent family history. Social History   Socioeconomic History   Marital status: Widowed    Spouse name: Not on file   Number of children: Not on file   Years of education: Not on file   Highest education level: Not on file  Occupational History   Occupation: Disabled  Tobacco Use   Smoking status: Former Smoker    Packs/day: 1.00    Years: 5.00    Pack years: 5.00    Types: Cigarettes    Quit date: 03/16/1987    Years since quitting: 33.3   Smokeless tobacco: Never Used  Substance and Sexual Activity   Alcohol use: No   Drug use: No   Sexual activity: Not Currently  Other Topics Concern   Not on file  Social History Narrative   Not on file   Social Determinants of Health   Financial Resource Strain: Not on file  Food Insecurity: Not on file  Transportation Needs: Not on file  Physical Activity: Not on file  Stress: Not on file  Social Connections: Not on file    Review of Systems  Constitutional: Negative for activity change and appetite change.  HENT: Negative for congestion and sinus pain.   Eyes: Negative for visual disturbance.  Respiratory: Positive for shortness of breath. Negative for chest tightness.   Cardiovascular: Negative for chest pain, palpitations and leg swelling.  Gastrointestinal: Negative for abdominal distention and abdominal pain.  Endocrine: Negative for polyuria.  Genitourinary: Negative for difficulty urinating and dysuria.  Musculoskeletal: Negative for arthralgias and back pain.  Skin: Negative.   Neurological: Negative.   Psychiatric/Behavioral: Negative.      Objective:  BP 120/84   Pulse 62   Temp (!) 97.4 F (36.3 C)   Resp 16   Ht 5' 0.5" (1.537 m)   Wt 144 lb (65.3 kg)   LMP  (LMP Unknown)    SpO2 97%   BMI 27.66 kg/m   BP/Weight 07/21/2020 06/03/2020 11/06/537  Systolic BP 767 341 937  Diastolic BP 84 60 70  Wt. (Lbs) 144 146.2 146  BMI 27.66 28.08 28.04    Physical Exam Vitals reviewed.  Constitutional:      Appearance: Normal appearance. She is normal weight.  HENT:     Head: Normocephalic and atraumatic.     Right Ear: Tympanic membrane, ear canal and external ear normal.     Left Ear: Tympanic membrane and external ear normal.     Nose: Nose normal.     Mouth/Throat:     Mouth: Mucous membranes are dry.     Pharynx: Oropharynx is clear.  Eyes:     Extraocular Movements: Extraocular movements intact.     Conjunctiva/sclera: Conjunctivae normal.  Pupils: Pupils are equal, round, and reactive to light.  Cardiovascular:     Rate and Rhythm: Normal rate and regular rhythm.     Pulses: Normal pulses.     Heart sounds: Normal heart sounds. No murmur heard. No gallop.   Pulmonary:     Effort: Pulmonary effort is normal. No respiratory distress.     Breath sounds: Normal breath sounds. No rales.  Abdominal:     General: Abdomen is flat. Bowel sounds are normal. There is no distension.     Palpations: Abdomen is soft.     Tenderness: There is no abdominal tenderness.  Musculoskeletal:        General: Normal range of motion.     Cervical back: Normal range of motion and neck supple.  Skin:    General: Skin is warm and dry.     Capillary Refill: Capillary refill takes less than 2 seconds.  Neurological:     General: No focal deficit present.     Mental Status: She is alert and oriented to person, place, and time. Mental status is at baseline.  Psychiatric:        Mood and Affect: Mood normal.     Diabetic Foot Exam - Simple   Simple Foot Form Diabetic Foot exam was performed with the following findings: Yes 07/21/2020 11:00 AM  Visual Inspection No deformities, no ulcerations, no other skin breakdown bilaterally: Yes Sensation Testing Intact to touch and  monofilament testing bilaterally: Yes Pulse Check Posterior Tibialis and Dorsalis pulse intact bilaterally: Yes Comments      Lab Results  Component Value Date   WBC 7.3 12/24/2019   HGB 14.3 12/24/2019   HCT 44.4 12/24/2019   PLT 222 12/24/2019   GLUCOSE 208 (H) 12/24/2019   CHOL 167 12/24/2019   TRIG 144 12/24/2019   HDL 60 12/24/2019   LDLCALC 82 12/24/2019   ALT 19 12/24/2019   AST 24 12/24/2019   NA 142 12/24/2019   K 4.6 12/24/2019   CL 105 12/24/2019   CREATININE 1.06 (H) 12/24/2019   BUN 13 12/24/2019   CO2 26 12/24/2019   HGBA1C 7.9 (H) 12/24/2019   MICROALBUR 30 07/21/2020      Assessment & Plan:   1. Type 2 diabetes mellitus with other specified complication, with long-term current use of insulin (HCC) - POCT UA - Microalbumin - Comprehensive metabolic panel - Hemoglobin A1c - CBC with Differential/Platelet An individual care plan for diabetes was established and reinforced today.  The patient's status was assessed using clinical findings on exam, labs and diagnostic testing. Patient success at meeting goals based on disease specific evidence-based guidelines and found to be good controlled. amaryl stopped Medications were assessed and patient's understanding of the medical issues , including barriers were assessed. Recommend adherence to a diabetic diet, a graduated exercise program, HgbA1c level is checked quarterly, and urine microalbumin performed yearly .  Annual mono-filament sensation testing performed. Lower blood pressure and control hyperlipidemia is important. Get annual eye exams and annual flu shots and smoking cessation discussed.  Self management goals were discussed.  2. Dysuria - POCT URINALYSIS DIP ([facility]) Urinalysis is clean  3. Mixed hyperlipidemia - Comprehensive metabolic panel - Lipid panel - CBC with Differential/Platelet AN INDIVIDUAL CARE PLAN for hyperlipidemia/ cholesterol was established and reinforced today.  The patient's  status was assessed using clinical findings on exam, lab and other diagnostic tests. The patient's disease status was assessed based on evidence-based guidelines and found to be fair controlled.  MEDICATIONS were reviewed. SELF MANAGEMENT GOALS have been discussed and patient's success at attaining the goal of low cholesterol was assessed. RECOMMENDATION given include regular exercise 3 days a week and low cholesterol/low fat diet. CLINICAL SUMMARY including written plan to identify barriers unique to the patient due to social or economic  reasons was discussed.  4. Long term (current) use of insulin ([place]) Patient is doing well on insulin, no hypoglycemia  5. Age-related osteoporosis without current pathological fracture  6. Postmenopausal atrophic vaginitis Patient has atrophic vaginitis and is on premarin cream,. She has osteoporosis on alendronate  7. Sequelae of poliomyelitis Patient has weakness in legs chronic  8. BMI 27.0-27.9,adult An individualize plan was formulated for obesity using patient history and physical exam to encourage weight loss.  An evidence based program was formulated.  Patient is to keep portion size with meals and to plan physical exercise 3 days a week at least 20 minutes.  Weight watchers and other programs are helpful.  Planned amount of weight loss zero lbs.    No orders of the defined types were placed in this encounter.   Orders Placed This Encounter  Procedures   DG DXA BODY COMPOSITION   Comprehensive metabolic panel   Hemoglobin A1c   Lipid panel   CBC with Differential/Platelet   POCT URINALYSIS DIP (CLINITEK)   POCT UA - Microalbumin   30 minute visit, review of records  Follow-up: Return in about 6 months (around 01/21/2021) for fasting.  An After Visit Summary was printed and given to the patient.  Reinaldo Meeker, MD [facility] [phone]

## 2020-07-22 ENCOUNTER — Other Ambulatory Visit: Payer: Self-pay | Admitting: Legal Medicine

## 2020-07-22 DIAGNOSIS — E1169 Type 2 diabetes mellitus with other specified complication: Secondary | ICD-10-CM

## 2020-07-22 DIAGNOSIS — Z794 Long term (current) use of insulin: Secondary | ICD-10-CM

## 2020-07-22 LAB — CBC WITH DIFFERENTIAL/PLATELET
Basophils Absolute: 0 10*3/uL (ref 0.0–0.2)
Basos: 0 %
EOS (ABSOLUTE): 0.1 10*3/uL (ref 0.0–0.4)
Eos: 2 %
Hematocrit: 44.2 % (ref 34.0–46.6)
Hemoglobin: 14.6 g/dL (ref 11.1–15.9)
Immature Grans (Abs): 0 10*3/uL (ref 0.0–0.1)
Immature Granulocytes: 0 %
Lymphocytes Absolute: 2.8 10*3/uL (ref 0.7–3.1)
Lymphs: 41 %
MCH: 28.6 pg (ref 26.6–33.0)
MCHC: 33 g/dL (ref 31.5–35.7)
MCV: 87 fL (ref 79–97)
Monocytes Absolute: 0.6 10*3/uL (ref 0.1–0.9)
Monocytes: 8 %
Neutrophils Absolute: 3.4 10*3/uL (ref 1.4–7.0)
Neutrophils: 49 %
Platelets: 221 10*3/uL (ref 150–450)
RBC: 5.11 x10E6/uL (ref 3.77–5.28)
RDW: 14.8 % (ref 11.7–15.4)
WBC: 6.9 10*3/uL (ref 3.4–10.8)

## 2020-07-22 LAB — COMPREHENSIVE METABOLIC PANEL
ALT: 35 IU/L — ABNORMAL HIGH (ref 0–32)
AST: 36 IU/L (ref 0–40)
Albumin/Globulin Ratio: 1.9 (ref 1.2–2.2)
Albumin: 4.3 g/dL (ref 3.6–4.6)
Alkaline Phosphatase: 63 IU/L (ref 44–121)
BUN/Creatinine Ratio: 19 (ref 12–28)
BUN: 15 mg/dL (ref 8–27)
Bilirubin Total: 0.5 mg/dL (ref 0.0–1.2)
CO2: 25 mmol/L (ref 20–29)
Calcium: 9.6 mg/dL (ref 8.7–10.3)
Chloride: 101 mmol/L (ref 96–106)
Creatinine, Ser: 0.8 mg/dL (ref 0.57–1.00)
Globulin, Total: 2.3 g/dL (ref 1.5–4.5)
Glucose: 174 mg/dL — ABNORMAL HIGH (ref 65–99)
Potassium: 4.9 mmol/L (ref 3.5–5.2)
Sodium: 141 mmol/L (ref 134–144)
Total Protein: 6.6 g/dL (ref 6.0–8.5)
eGFR: 74 mL/min/{1.73_m2} (ref >59)

## 2020-07-22 LAB — LIPID PANEL
Chol/HDL Ratio: 2.3 ratio (ref 0.0–4.4)
Cholesterol, Total: 160 mg/dL (ref 100–199)
HDL: 70 mg/dL (ref >39)
LDL Chol Calc (NIH): 68 mg/dL (ref 0–99)
Triglycerides: 127 mg/dL (ref 0–149)
VLDL Cholesterol Cal: 22 mg/dL (ref 5–40)

## 2020-07-22 LAB — CARDIOVASCULAR RISK ASSESSMENT

## 2020-07-22 LAB — HEMOGLOBIN A1C
Est. average glucose Bld gHb Est-mCnc: 192 mg/dL
Hgb A1c MFr Bld: 8.3 % — ABNORMAL HIGH (ref 4.8–5.6)

## 2020-07-22 MED ORDER — VICTOZA 18 MG/3ML ~~LOC~~ SOPN
1.8000 mg | PEN_INJECTOR | Freq: Every day | SUBCUTANEOUS | 6 refills | Status: DC
Start: 2020-07-22 — End: 2020-09-23

## 2020-07-22 NOTE — Progress Notes (Signed)
Glucose 174, kidney tests normal, one liver test normal A1c 8.3 increase liraglutide to 1.8mg , cholesterol normal, cbc normal,  lp

## 2020-07-24 ENCOUNTER — Telehealth: Payer: Self-pay | Admitting: Legal Medicine

## 2020-07-24 NOTE — Telephone Encounter (Signed)
   Rebecca Hodge has been scheduled for the following appointment:  WHAT: DEXA WHERE: [place] DATE: 08/06/20 TIME: 11:30  Patient has been made aware.

## 2020-07-29 ENCOUNTER — Other Ambulatory Visit: Payer: Self-pay

## 2020-07-29 DIAGNOSIS — I152 Hypertension secondary to endocrine disorders: Secondary | ICD-10-CM

## 2020-07-29 MED ORDER — METOPROLOL TARTRATE 25 MG PO TABS: 25.0000 mg | ORAL_TABLET | Freq: Two times a day (BID) | ORAL | 1 refills | Status: DC

## 2020-07-30 ENCOUNTER — Other Ambulatory Visit: Payer: Self-pay | Admitting: Legal Medicine

## 2020-07-30 DIAGNOSIS — I152 Hypertension secondary to endocrine disorders: Secondary | ICD-10-CM

## 2020-07-30 DIAGNOSIS — E1159 Type 2 diabetes mellitus with other circulatory complications: Secondary | ICD-10-CM

## 2020-08-06 DIAGNOSIS — M810 Age-related osteoporosis without current pathological fracture: Secondary | ICD-10-CM | POA: Diagnosis not present

## 2020-08-06 DIAGNOSIS — N959 Unspecified menopausal and perimenopausal disorder: Secondary | ICD-10-CM | POA: Diagnosis not present

## 2020-08-13 DIAGNOSIS — R32 Unspecified urinary incontinence: Secondary | ICD-10-CM | POA: Diagnosis not present

## 2020-08-20 DIAGNOSIS — E119 Type 2 diabetes mellitus without complications: Secondary | ICD-10-CM | POA: Diagnosis not present

## 2020-08-25 ENCOUNTER — Other Ambulatory Visit: Payer: Self-pay | Admitting: Legal Medicine

## 2020-08-26 ENCOUNTER — Other Ambulatory Visit: Payer: Self-pay | Admitting: Legal Medicine

## 2020-08-26 DIAGNOSIS — N952 Postmenopausal atrophic vaginitis: Secondary | ICD-10-CM

## 2020-09-02 DIAGNOSIS — D225 Melanocytic nevi of trunk: Secondary | ICD-10-CM | POA: Diagnosis not present

## 2020-09-02 DIAGNOSIS — L820 Inflamed seborrheic keratosis: Secondary | ICD-10-CM | POA: Diagnosis not present

## 2020-09-02 DIAGNOSIS — D2239 Melanocytic nevi of other parts of face: Secondary | ICD-10-CM | POA: Diagnosis not present

## 2020-09-02 DIAGNOSIS — L821 Other seborrheic keratosis: Secondary | ICD-10-CM | POA: Diagnosis not present

## 2020-09-02 DIAGNOSIS — D485 Neoplasm of uncertain behavior of skin: Secondary | ICD-10-CM | POA: Diagnosis not present

## 2020-09-13 DIAGNOSIS — R32 Unspecified urinary incontinence: Secondary | ICD-10-CM | POA: Diagnosis not present

## 2020-09-22 ENCOUNTER — Telehealth: Payer: Self-pay | Admitting: Legal Medicine

## 2020-09-22 NOTE — Chronic Care Management (AMB) (Signed)
  Chronic Care Management   Note  09/22/2020 Name: [name] MRN: [id number] DOB: Jul 25, 1940  Rebecca Hodge is a 80 y.o. year old female who is a primary care patient of Lillard Anes, MD. I reached out to [name] by phone today in response to a referral sent by [name] PCP, Lillard Anes, MD.   [name] was given information about Chronic Care Management services today including:  CCM service includes personalized support from designated clinical staff supervised by her physician, including individualized plan of care and coordination with other care providers 24/7 contact phone numbers for assistance for urgent and routine care needs. Service will only be billed when office clinical staff spend 20 minutes or more in a month to coordinate care. Only one practitioner may furnish and bill the service in a calendar month. The patient may stop CCM services at any time (effective at the end of the month) by phone call to the office staff.   [name] verbally agreed to assistance and services provided by embedded care coordination/care management team today.  Follow up plan:   [name]

## 2020-09-23 ENCOUNTER — Other Ambulatory Visit: Payer: Self-pay | Admitting: Legal Medicine

## 2020-09-23 DIAGNOSIS — E1169 Type 2 diabetes mellitus with other specified complication: Secondary | ICD-10-CM

## 2020-10-08 ENCOUNTER — Other Ambulatory Visit: Payer: Self-pay | Admitting: Legal Medicine

## 2020-10-08 DIAGNOSIS — N952 Postmenopausal atrophic vaginitis: Secondary | ICD-10-CM

## 2020-10-14 DIAGNOSIS — R32 Unspecified urinary incontinence: Secondary | ICD-10-CM | POA: Diagnosis not present

## 2020-10-24 ENCOUNTER — Other Ambulatory Visit: Payer: Self-pay | Admitting: Legal Medicine

## 2020-10-30 ENCOUNTER — Other Ambulatory Visit: Payer: Self-pay

## 2020-10-30 DIAGNOSIS — Z794 Long term (current) use of insulin: Secondary | ICD-10-CM

## 2020-10-30 DIAGNOSIS — E782 Mixed hyperlipidemia: Secondary | ICD-10-CM

## 2020-10-30 DIAGNOSIS — F339 Major depressive disorder, recurrent, unspecified: Secondary | ICD-10-CM

## 2020-10-30 DIAGNOSIS — N952 Postmenopausal atrophic vaginitis: Secondary | ICD-10-CM

## 2020-10-30 DIAGNOSIS — E1159 Type 2 diabetes mellitus with other circulatory complications: Secondary | ICD-10-CM

## 2020-10-30 DIAGNOSIS — R1084 Generalized abdominal pain: Secondary | ICD-10-CM

## 2020-10-30 MED ORDER — GLIMEPIRIDE 1 MG PO TABS: 1.0000 mg | ORAL_TABLET | Freq: Every day | ORAL | 3 refills | Status: DC

## 2020-10-30 MED ORDER — METOPROLOL TARTRATE 25 MG PO TABS: 25.0000 mg | ORAL_TABLET | Freq: Two times a day (BID) | ORAL | 6 refills | Status: DC

## 2020-10-30 MED ORDER — ALENDRONATE SODIUM 70 MG PO TABS: ORAL_TABLET | ORAL | 6 refills | Status: DC

## 2020-10-30 MED ORDER — MIRTAZAPINE 15 MG PO TABS: 15.0000 mg | ORAL_TABLET | Freq: Every day | ORAL | 3 refills | Status: DC

## 2020-10-30 MED ORDER — EZETIMIBE 10 MG PO TABS: 10.0000 mg | ORAL_TABLET | Freq: Every day | ORAL | 6 refills | Status: DC

## 2020-10-30 MED ORDER — GABAPENTIN 300 MG PO CAPS
300.0000 mg | ORAL_CAPSULE | Freq: Three times a day (TID) | ORAL | 6 refills | Status: DC
Start: 2020-10-30 — End: 2021-01-25

## 2020-10-30 MED ORDER — ESTRADIOL 0.1 MG/GM VA CREA: TOPICAL_CREAM | VAGINAL | 3 refills | Status: DC

## 2020-10-30 MED ORDER — LEVEMIR FLEXTOUCH 100 UNIT/ML ~~LOC~~ SOPN: 12.0000 [IU] | PEN_INJECTOR | Freq: Every day | SUBCUTANEOUS | 3 refills | Status: DC

## 2020-10-30 MED ORDER — PANTOPRAZOLE SODIUM 40 MG PO TBEC: 40.0000 mg | DELAYED_RELEASE_TABLET | Freq: Every day | ORAL | 6 refills | Status: DC

## 2020-10-30 MED ORDER — LISINOPRIL 20 MG PO TABS: 20.0000 mg | ORAL_TABLET | Freq: Every day | ORAL | 3 refills | Status: DC

## 2020-10-30 MED ORDER — FLUTICASONE PROPIONATE 50 MCG/ACT NA SUSP: 1.0000 | Freq: Every day | NASAL | 6 refills | Status: DC

## 2020-10-30 MED ORDER — VICTOZA 18 MG/3ML ~~LOC~~ SOPN: PEN_INJECTOR | SUBCUTANEOUS | 3 refills | Status: DC

## 2020-10-30 MED ORDER — ROSUVASTATIN CALCIUM 40 MG PO TABS: 40.0000 mg | ORAL_TABLET | Freq: Every day | ORAL | 6 refills | Status: DC

## 2020-11-12 ENCOUNTER — Ambulatory Visit (INDEPENDENT_AMBULATORY_CARE_PROVIDER_SITE_OTHER): Payer: Medicare HMO

## 2020-11-12 DIAGNOSIS — Z0000 Encounter for general adult medical examination without abnormal findings: Secondary | ICD-10-CM | POA: Diagnosis not present

## 2020-11-12 NOTE — Patient Instructions (Signed)
Health Maintenance, Female Adopting a healthy lifestyle and getting preventive care are important in promoting health and wellness. Ask your health care provider about: The right schedule for you to have regular tests and exams. Things you can do on your own to prevent diseases and keep yourself healthy. What should I know about diet, weight, and exercise? Eat a healthy diet  Eat a diet that includes plenty of vegetables, fruits, low-fat dairy products, and lean protein. Do not eat a lot of foods that are high in solid fats, added sugars, or sodium. Maintain a healthy weight Body mass index (BMI) is used to identify weight problems. It estimates body fat based on height and weight. Your health care provider can help determine your BMI and help you achieve or maintain a healthy weight. Get regular exercise Get regular exercise. This is one of the most important things you can do for your health. Most adults should: Exercise for at least 150 minutes each week. The exercise should increase your heart rate and make you sweat (moderate-intensity exercise). Do strengthening exercises at least twice a week. This is in addition to the moderate-intensity exercise. Spend less time sitting. Even light physical activity can be beneficial. Watch cholesterol and blood lipids Have your blood tested for lipids and cholesterol at 80 years of age, then have this test every 5 years. Have your cholesterol levels checked more often if: Your lipid or cholesterol levels are high. You are older than 82 years of age. You are at high risk for heart disease. What should I know about cancer screening? Depending on your health history and family history, you may need to have cancer screening at various ages. This may include screening for: Breast cancer. Cervical cancer. Colorectal cancer. Skin cancer. Lung cancer. What should I know about heart disease, diabetes, and high blood pressure? Blood pressure and heart  disease High blood pressure causes heart disease and increases the risk of stroke. This is more likely to develop in people who have high blood pressure readings, are of African descent, or are overweight. Have your blood pressure checked: Every 3-5 years if you are 18-39 years of age. Every year if you are 40 years old or older. Diabetes Have regular diabetes screenings. This checks your fasting blood sugar level. Have the screening done: Once every three years after age 40 if you are at a normal weight and have a low risk for diabetes. More often and at a younger age if you are overweight or have a high risk for diabetes. What should I know about preventing infection? Hepatitis B If you have a higher risk for hepatitis B, you should be screened for this virus. Talk with your health care provider to find out if you are at risk for hepatitis B infection. Hepatitis C Testing is recommended for: Everyone born from 1945 through 1965. Anyone with known risk factors for hepatitis C. Sexually transmitted infections (STIs) Get screened for STIs, including gonorrhea and chlamydia, if: You are sexually active and are younger than 82 years of age. You are older than 82 years of age and your health care provider tells you that you are at risk for this type of infection. Your sexual activity has changed since you were last screened, and you are at increased risk for chlamydia or gonorrhea. Ask your health care provider if you are at risk. Ask your health care provider about whether you are at high risk for HIV. Your health care provider may recommend a prescription medicine   to help prevent HIV infection. If you choose to take medicine to prevent HIV, you should first get tested for HIV. You should then be tested every 3 months for as long as you are taking the medicine. Pregnancy If you are about to stop having your period (premenopausal) and you may become pregnant, seek counseling before you get  pregnant. Take 400 to 800 micrograms (mcg) of folic acid every day if you become pregnant. Ask for birth control (contraception) if you want to prevent pregnancy. Osteoporosis and menopause Osteoporosis is a disease in which the bones lose minerals and strength with aging. This can result in bone fractures. If you are 65 years old or older, or if you are at risk for osteoporosis and fractures, ask your health care provider if you should: Be screened for bone loss. Take a calcium or vitamin D supplement to lower your risk of fractures. Be given hormone replacement therapy (HRT) to treat symptoms of menopause. Follow these instructions at home: Lifestyle Do not use any products that contain nicotine or tobacco, such as cigarettes, e-cigarettes, and chewing tobacco. If you need help quitting, ask your health care provider. Do not use street drugs. Do not share needles. Ask your health care provider for help if you need support or information about quitting drugs. Alcohol use Do not drink alcohol if: Your health care provider tells you not to drink. You are pregnant, may be pregnant, or are planning to become pregnant. If you drink alcohol: Limit how much you use to 0-1 drink a day. Limit intake if you are breastfeeding. Be aware of how much alcohol is in your drink. In the [place] one drink equals one 12 oz bottle of beer (355 mL), one 5 oz glass of wine (148 mL), or one 1 oz glass of hard liquor (44 mL). General instructions Schedule regular health, dental, and eye exams. Stay current with your vaccines. Tell your health care provider if: You often feel depressed. You have ever been abused or do not feel safe at home. Summary Adopting a healthy lifestyle and getting preventive care are important in promoting health and wellness. Follow your health care provider's instructions about healthy diet, exercising, and getting tested or screened for diseases. Follow your health care provider's  instructions on monitoring your cholesterol and blood pressure. This information is not intended to replace advice given to you by your health care provider. Make sure you discuss any questions you have with your health care provider. Document Revised: 05/09/2020 Document Reviewed: 02/22/2018 Elsevier Patient Education  2022 [employer]  

## 2020-11-12 NOTE — Progress Notes (Signed)
Subjective:   Rebecca Hodge is a 80 y.o. female who presents for an Initial Medicare Annual Wellness Visit.  Review of Systems    I connected with  REGGIE WELGE on 11/12/20 by an audio only telemedicine application and verified that I am speaking with the correct Adasia Hoar using two identifiers.   I discussed the limitations, risks, security and privacy concerns of performing an evaluation and management service by telephone and the availability of in [facility] appointments. I also discussed with the patient that there may be a patient responsible charge related to this service. The patient expressed understanding and verbally consented to this telephonic visit.  Location of Patient: Home Location of Provider: Office  List any persons and their role that are participating in the visit with the patient. Winfred Burn and Randa Evens Erinne Gillentine, CMA        Objective:    There were no vitals filed for this visit. There is no height or weight on file to calculate BMI.  No flowsheet data found.  Current Medications (verified) Outpatient Encounter Medications as of 11/12/2020  Medication Sig   ACCU-CHEK GUIDE test strip 1 each by Other route daily. Check blood sugar fasting in the morning daily.   alendronate (FOSAMAX) 70 MG tablet TAKE 1 TABLET BY MOUTH WEEKLY ON THE SAME DAY EACH WEEK, WITH A FULL GLASS OF WATER AT LEAST 30 MINUTES BEFORE FIRST FOOD OR BEVERAGE OF THE DAY. REMAIN UPRIGHT FOR AT LEAST 30 MINUTES AFTER TAKING   BD PEN NEEDLE NANO U/F 32G X 4 MM MISC See admin instructions.   Blood Glucose Monitoring Suppl (ACCU-CHEK GUIDE) w/Device KIT 1 each by Other route daily.   estradiol (ESTRACE) 0.1 MG/GM vaginal cream INSERT 1 APPLICATORFUL VAGINALLY 3 TIMES A WEEK   ezetimibe (ZETIA) 10 MG tablet Take 1 tablet (10 mg total) by mouth daily.   fluticasone (FLONASE) 50 MCG/ACT nasal spray Place 1 spray into both nostrils daily.   gabapentin (NEURONTIN) 300 MG capsule Take 1 capsule (300  mg total) by mouth 3 (three) times daily.   glimepiride (AMARYL) 1 MG tablet Take 1 tablet (1 mg total) by mouth daily with breakfast.   insulin detemir (LEVEMIR FLEXTOUCH) 100 UNIT/ML FlexPen Inject 12 Units into the skin daily.   Lancets (ONETOUCH ULTRASOFT) lancets 1 each by Other route daily. Use as instructed   liraglutide (VICTOZA) 18 MG/3ML SOPN INJECT 0.2 MILLILITERS (1.2 MG TOTAL) INTO THE SKIN DAILY.   lisinopril (ZESTRIL) 20 MG tablet Take 1 tablet (20 mg total) by mouth daily.   metoprolol tartrate (LOPRESSOR) 25 MG tablet Take 1 tablet (25 mg total) by mouth 2 (two) times daily.   mirtazapine (REMERON) 15 MG tablet Take 1 tablet (15 mg total) by mouth at bedtime.   pantoprazole (PROTONIX) 40 MG tablet Take 1 tablet (40 mg total) by mouth daily.   rosuvastatin (CRESTOR) 40 MG tablet Take 1 tablet (40 mg total) by mouth at bedtime.   No facility-administered encounter medications on file as of 11/12/2020.    Allergies (verified) Nitrofuran derivatives, Codeine, Morphine and related, and Sulfa antibiotics   History: Past Medical History:  Diagnosis Date   Age-related osteoporosis without current pathological fracture 06/18/2019   Allergic rhinitis    Diabetes ([place])    Dysphasia following cerebral infarction 06/18/2019   Hyperlipidemia    Hypertension    Mixed hyperlipidemia 06/18/2019   Mixed incontinence 06/18/2019   Sequelae of poliomyelitis 06/18/2019   Type 2 diabetes mellitus with other specified  complication ([place]) 07/16/2990   Past Surgical History:  Procedure Laterality Date   ABDOMINAL HYSTERECTOMY     CATARACT EXTRACTION Right    CHOLECYSTECTOMY     ROTATOR CUFF REPAIR Bilateral    No family history on file. Social History   Socioeconomic History   Marital status: Widowed    Spouse name: Not on file   Number of children: Not on file   Years of education: Not on file   Highest education level: Not on file  Occupational History   Occupation: Disabled  Tobacco Use    Smoking status: Former    Packs/day: 1.00    Years: 5.00    Pack years: 5.00    Types: Cigarettes    Quit date: 03/16/1987    Years since quitting: 33.6   Smokeless tobacco: Never  Substance and Sexual Activity   Alcohol use: No   Drug use: No   Sexual activity: Not Currently  Other Topics Concern   Not on file  Social History Narrative   Not on file   Social Determinants of Health   Financial Resource Strain: Not on file  Food Insecurity: Not on file  Transportation Needs: Not on file  Physical Activity: Not on file  Stress: Not on file  Social Connections: Not on file    Tobacco Counseling Counseling given: Not Answered   Clinical Intake:  Pre-visit preparation completed: Yes  Pain : No/denies pain     Nutritional Risks: None Diabetes: Yes     Diabetic?yes  Interpreter Needed?: No      Activities of Daily Living In your present state of health, do you have any difficulty performing the following activities: 11/12/2020 03/27/2020  Hearing? N N  Vision? N N  Difficulty concentrating or making decisions? N N  Walking or climbing stairs? N N  Dressing or bathing? N N  Doing errands, shopping? N N  Some recent data might be hidden    Patient Care Team: Lillard Anes, MD as PCP - General (Family Medicine) Burnice Logan, [facility] as Pharmacist (Pharmacist) Burnice Logan, [facility] as Pharmacist (Pharmacist)  Indicate any recent Medical Services you may have received from other than [facility] providers in the past year (date may be approximate).     Assessment:   This is a routine wellness examination for Rebecca Hodge.  Hearing/Vision screen No results found.  Dietary issues and exercise activities discussed:     Goals Addressed   None   Depression Screen PHQ 2/9 Scores 11/12/2020 06/03/2020 06/25/2019  PHQ - 2 Score 0 3 1  PHQ- 9 Score 0 11 3    Fall Risk Fall Risk  11/12/2020 10/01/2019 06/25/2019  Falls in the past year? 0 0 -  Number falls in  past yr: 0 0 0  Injury with Fall? 0 0 0  Risk for fall due to : No Fall Risks - -  Follow up Falls evaluation completed Falls evaluation completed Falls evaluation completed    [place]:  Any stairs in or around the home? Yes  If so, are there any without handrails? No  Home free of loose throw rugs in walkways, pet beds, electrical cords, etc? Yes  Adequate lighting in your home to reduce risk of falls? Yes   ASSISTIVE DEVICES UTILIZED TO PREVENT FALLS:  Life alert? No  Use of a cane, walker or w/c? No  Grab bars in the bathroom? No  Shower chair or bench in shower? No  Elevated toilet seat or a handicapped toilet? No   TIMED UP AND GO:  Was the test performed? No .  Length of time to ambulate 10 feet: n/a sec.     Cognitive Function:     6CIT Screen 11/12/2020  What Year? 4 points  What month? 0 points  What time? 0 points  Count back from 20 0 points  Months in reverse 4 points  Repeat phrase 4 points  Total Score 12    Immunizations Immunization History  Administered Date(s) Administered   Fluad Quad(high Dose 65+) 12/24/2019   Influenza Split 12/14/2011, 12/13/2012   Moderna SARS-COV2 Booster Vaccination 06/03/2020   PFIZER(Purple Top)SARS-COV-2 Vaccination 06/21/2019, 07/16/2019   Pneumococcal Conjugate-13 08/31/2017   Pneumococcal Polysaccharide-23 06/11/2016    TDAP status: Due, Education has been provided regarding the importance of this vaccine. Advised may receive this vaccine at local pharmacy or Health Dept. Aware to provide a copy of the vaccination record if obtained from local pharmacy or Health Dept. Verbalized acceptance and understanding.  Flu Vaccine status: Due, Education has been provided regarding the importance of this vaccine. Advised may receive this vaccine at local pharmacy or Health Dept. Aware to provide a copy of the vaccination record if obtained from local pharmacy or Health Dept. Verbalized  acceptance and understanding.  Pneumococcal vaccine status: Up to date  Covid-19 vaccine status: Completed vaccines has had 3 vaccines.  Qualifies for Shingles Vaccine? Yes   Zostavax completed No   Shingrix Completed?: No.    Education has been provided regarding the importance of this vaccine. Patient has been advised to call insurance company to determine out of pocket expense if they have not yet received this vaccine. Advised may also receive vaccine at local pharmacy or Health Dept. Verbalized acceptance and understanding.  Screening Tests Health Maintenance  Topic Date Due   TETANUS/TDAP  Never done   Zoster Vaccines- Shingrix (1 of 2) Never done   MAMMOGRAM  11/30/2019   COVID-19 Vaccine (4 - Booster) 09/03/2020   INFLUENZA VACCINE  10/13/2020   HEMOGLOBIN A1C  01/21/2021   OPHTHALMOLOGY EXAM  03/19/2021   FOOT EXAM  07/21/2021   DEXA SCAN  Completed   PNA vac Low Risk Adult  Completed   HPV VACCINES  Aged Out    Health Maintenance  Health Maintenance Due  Topic Date Due   TETANUS/TDAP  Never done   Zoster Vaccines- Shingrix (1 of 2) Never done   MAMMOGRAM  11/30/2019   COVID-19 Vaccine (4 - Booster) 09/03/2020   INFLUENZA VACCINE  10/13/2020    Colorectal cancer screening: No longer required.   Mammogram status- not completed  Bone Density status: Completed 08/06/2020. Results reflect: Bone density results: OSTEOPOROSIS. Repeat every N/A years.  Lung Cancer Screening: (Low Dose CT Chest recommended if Age 66-80 years, 30 pack-year currently smoking OR have quit w/in 15years.) does not qualify.   Lung Cancer Screening Referral: N/A  Additional Screening:  Hepatitis C Screening: does not qualify  Vision Screening: Recommended annual ophthalmology exams for early detection of glaucoma and other disorders of the eye. Is the patient up to date with their annual eye exam?  Yes  Who is the provider or what is the name of the office in which the patient attends  annual eye exams? N/A If pt is not established with a provider, would they like to be referred to a provider to establish care? No .   Dental Screening: Recommended annual dental exams for proper oral hygiene  Community Resource Referral / Chronic Care Management: CRR required this visit?  No   CCM required this visit?  No      Plan:     I have personally reviewed and noted the following in the patient's chart:   Medical and social history Use of alcohol, tobacco or illicit drugs  Current medications and supplements including opioid prescriptions. Patient is not currently taking opioid prescriptions. Functional ability and status Nutritional status Physical activity Advanced directives List of other physicians Hospitalizations, surgeries, and ER visits in previous 12 months Vitals Screenings to include cognitive, depression, and falls Referrals and appointments  In addition, I have reviewed and discussed with patient certain preventive protocols, quality metrics, and best practice recommendations. A written personalized care plan for preventive services as well as general preventive health recommendations were provided to patient.     Ival Bible Geovanny Sartin, CMA   11/12/2020   Nurse Notes: Non Face to Face 40 min visit   Ms. Fauver , Thank you for taking time to come for your Medicare Wellness Visit. I appreciate your ongoing commitment to your health goals. Please review the following plan we discussed and let me know if I can assist you in the future.   These are the goals we discussed:  Goals      Pharmacy Care Plan        This is a list of the screening recommended for you and due dates:  Health Maintenance  Topic Date Due   Tetanus Vaccine  Never done   Zoster (Shingles) Vaccine (1 of 2) Never done   Mammogram  11/30/2019   COVID-19 Vaccine (4 - Booster) 09/03/2020   Flu Shot  10/13/2020   Hemoglobin A1C  01/21/2021   Eye exam for diabetics  03/19/2021    Complete foot exam   07/21/2021   DEXA scan (bone density measurement)  Completed   Pneumonia vaccines  Completed   HPV Vaccine  Aged Out

## 2020-11-14 ENCOUNTER — Telehealth: Payer: Self-pay

## 2020-11-14 NOTE — Chronic Care Management (AMB) (Signed)
Chronic Care Management Pharmacy Assistant   Name: Rebecca Hodge  MRN: [id number] DOB: 1941/02/08  Rebecca Hodge is an 80 y.o. year old female who presents for her initial CCM visit with the clinical pharmacist.  Reason for Encounter: Chart Prep/ IQ   Recent office visits:  11/12/20- Medicare annual wellness exam 07/21/20- Reinaldo Meeker, MD- seen for chronic conditions,  labs ordered, Dexa scan ordered, no medication changes, follow up 6 months  06/03/20- Reinaldo Meeker, MD- seen for dysuria, started mirtazipine 15 mg daily at bedtime, follow up 1 month  Recent consult visits:  No visits noted  Hospital visits:  None in previous 6 months  Medications: Outpatient Encounter Medications as of 11/14/2020  Medication Sig   ACCU-CHEK GUIDE test strip 1 each by Other route daily. Check blood sugar fasting in the morning daily.   alendronate (FOSAMAX) 70 MG tablet TAKE 1 TABLET BY MOUTH WEEKLY ON THE SAME DAY EACH WEEK, WITH A FULL GLASS OF WATER AT LEAST 30 MINUTES BEFORE FIRST FOOD OR BEVERAGE OF THE DAY. REMAIN UPRIGHT FOR AT LEAST 30 MINUTES AFTER TAKING   BD PEN NEEDLE NANO U/F 32G X 4 MM MISC See admin instructions.   Blood Glucose Monitoring Suppl (ACCU-CHEK GUIDE) w/Device KIT 1 each by Other route daily.   estradiol (ESTRACE) 0.1 MG/GM vaginal cream INSERT 1 APPLICATORFUL VAGINALLY 3 TIMES A WEEK   ezetimibe (ZETIA) 10 MG tablet Take 1 tablet (10 mg total) by mouth daily.   fluticasone (FLONASE) 50 MCG/ACT nasal spray Place 1 spray into both nostrils daily.   gabapentin (NEURONTIN) 300 MG capsule Take 1 capsule (300 mg total) by mouth 3 (three) times daily.   glimepiride (AMARYL) 1 MG tablet Take 1 tablet (1 mg total) by mouth daily with breakfast.   insulin detemir (LEVEMIR FLEXTOUCH) 100 UNIT/ML FlexPen Inject 12 Units into the skin daily.   Lancets (ONETOUCH ULTRASOFT) lancets 1 each by Other route daily. Use as instructed   liraglutide (VICTOZA) 18 MG/3ML SOPN INJECT  0.2 MILLILITERS (1.2 MG TOTAL) INTO THE SKIN DAILY.   lisinopril (ZESTRIL) 20 MG tablet Take 1 tablet (20 mg total) by mouth daily.   metoprolol tartrate (LOPRESSOR) 25 MG tablet Take 1 tablet (25 mg total) by mouth 2 (two) times daily.   mirtazapine (REMERON) 15 MG tablet Take 1 tablet (15 mg total) by mouth at bedtime.   pantoprazole (PROTONIX) 40 MG tablet Take 1 tablet (40 mg total) by mouth daily.   rosuvastatin (CRESTOR) 40 MG tablet Take 1 tablet (40 mg total) by mouth at bedtime.   No facility-administered encounter medications on file as of 11/14/2020.    Lab Results  Component Value Date/Time   HGBA1C 8.3 (H) 07/21/2020 11:07 AM   HGBA1C 7.9 (H) 12/24/2019 11:04 AM   MICROALBUR 30 07/21/2020 11:11 AM   MICROALBUR 30 06/25/2019 09:02 AM     BP Readings from Last 3 Encounters:  07/21/20 120/84  06/03/20 118/60  03/27/20 124/70   All responses are from patient's daughter Rebecca Hodge  Current Documented Medications alendronate 70 MG - 28 DS last filled 09/23/20 estradiol 0.1 MG/GM vaginal cream- 90 DS last filled 07/06/20 ezetimibe 10 MG-30 DS last filled 07/28/20 fluticasone 50 MCG/ACT - 60 DS last filled 06/30/20 gabapentin  300 MG - 30 DS last filled 09/23/20 insulin detemir 100 UNIT/ML- 125 DS last filled 06/30/20 liraglutide 18 MG/3ML SOPN- 30 DS last filled 09/26/20 metoprolol tartrate  25 MG- 30 DS last filled 09/23/20 mirtazapine 15 MG-  30 DS last filled 06/29/20 pantoprazole  40 MG - 30 DS last filled 09/23/20   Star Rating Drugs:  Medication:   Last Fill: [facility] rosuvastatin  40 MG  09/23/20 30 DS  glimepiride  1 MG  09/23/20 30 DS  lisinopril  20 MG Patient reported no longer taking   Have you seen any other providers since your last visit with PCP?  Patient has not seen any other providers   Any changes in your medications or health?  Patient's daughter stated that patient will be having skin cancer removal 09/16   Any side effects from any  medications?  Patient confirmed no side effects   Do you have an symptoms or problems not managed by your medications?  Patient stated she has nausea after taking medication despite eating light meals before hand  Any concerns about your health right now?  Patient's daughter confirmed there are no concerns   Has your provider asked that you check blood pressure, blood sugar, or follow special diet at home?  Patient's daughter stated that they use an arm cuff to check BP regularly. They also check BS when feeling symptomatic.  Patient's daughter stated that patient has no specific diet. She eats eggs, bacon, cereal, broccoli and cheese and asparagus. She also drinks orange juice and milk.  Do you get any type of exercise on a regular basis?  Patient's daughter stated that patient walks, cleans, and plays with dogs to remain active  Can you think of a goal you would like to reach for your health? No Patient stated she doesn't have a any goals to reach  Do you have any problems getting your medications?  Patient does not have any problems  Is there anything that you would like to discuss during the appointment? No Patient did not have anything to discuss  Rebecca Hodge was reminded to have all medications, supplements and any blood glucose and blood pressure readings available for review with Clarise Cruz B. Joline Salt. D, at her telephone visit on 11/21/20 at 2 pm .   Care Gaps: Last annual wellness visit? 41/36/43 If applicable: Last eye exam / retinopathy screening? 07/21/20 Last diabetic foot exam? 07/21/20  [facility] CPA, CMA

## 2020-11-18 DIAGNOSIS — R32 Unspecified urinary incontinence: Secondary | ICD-10-CM | POA: Diagnosis not present

## 2020-11-19 ENCOUNTER — Ambulatory Visit: Payer: Medicare HMO

## 2020-11-21 ENCOUNTER — Ambulatory Visit (INDEPENDENT_AMBULATORY_CARE_PROVIDER_SITE_OTHER): Payer: Medicare HMO

## 2020-11-21 ENCOUNTER — Other Ambulatory Visit: Payer: Self-pay

## 2020-11-21 DIAGNOSIS — E782 Mixed hyperlipidemia: Secondary | ICD-10-CM

## 2020-11-21 DIAGNOSIS — E1169 Type 2 diabetes mellitus with other specified complication: Secondary | ICD-10-CM

## 2020-11-21 DIAGNOSIS — Z794 Long term (current) use of insulin: Secondary | ICD-10-CM

## 2020-11-21 NOTE — Patient Instructions (Addendum)
Visit Information   Goals Addressed             This Visit's Progress    Manage My Medicine       Timeframe:  Long-Range Goal Priority:  High Start Date:                             Expected End Date:                       Follow Up Date 11/21/21    - call for medicine refill 2 or 3 days before it runs out - learn to read medicine labels - use a pillbox to sort medicine    Why is this important?   These steps will help you keep on track with your medicines.   Notes:      Monitor and Manage My Blood Sugar-Diabetes Type 2       Timeframe:  Long-Range Goal Priority:  High Start Date:                             Expected End Date:                       Follow Up Date 11/21/21   - enter blood sugar readings and medication or insulin into daily log - take the blood sugar log to all doctor visits - take the blood sugar meter to all doctor visits    Why is this important?   Checking your blood sugar at home helps to keep it from getting very high or very low.  Writing the results in a diary or log helps the doctor know how to care for you.  Your blood sugar log should have the time, date and the results.  Also, write down the amount of insulin or other medicine that you take.  Other information, like what you ate, exercise done and how you were feeling, will also be helpful.     Notes:      Track and Manage My Blood Pressure-Hypertension       Timeframe:  Long-Range Goal Priority:  High Start Date:                             Expected End Date:                       Follow Up Date 11/21/21   - check blood pressure daily - choose a place to take my blood pressure (home, clinic or office, retail store) - write blood pressure results in a log or diary    Why is this important?   You won't feel high blood pressure, but it can still hurt your blood vessels.  High blood pressure can cause heart or kidney problems. It can also cause a stroke.  Making lifestyle changes like  losing a little weight or eating less salt will help.  Checking your blood pressure at home and at different times of the day can help to control blood pressure.  If the doctor prescribes medicine remember to take it the way the doctor ordered.  Call the office if you cannot afford the medicine or if there are questions about it.     Notes:        Patient Care  Plan: CCM Pharmacy Care Plan     Problem Identified: DM, HTN, Lipids, Osteopenia   Priority: High  Onset Date: 11/21/2020     Long-Range Goal: Disease State Management   Start Date: 11/21/2020  Expected End Date: 11/21/2021  This Visit's Progress: On track  Priority: High  Note:   Current Barriers:  Unable to achieve control of DM  Does not adhere to prescribed medication regimen Does not maintain contact with provider office Does not contact provider office for questions/concerns  Pharmacist Clinical Goal(s):  Patient will achieve adherence to monitoring guidelines and medication adherence to achieve therapeutic efficacy contact provider office for questions/concerns as evidenced notation of same in electronic health record through collaboration with PharmD and provider.   Interventions: 1:1 collaboration with Lillard Anes, MD regarding development and update of comprehensive plan of care as evidenced by provider attestation and co-signature Inter-disciplinary care team collaboration (see longitudinal plan of care) Comprehensive medication review performed; medication list updated in electronic medical record  Hypertension (BP goal <140/90) BP Readings from Last 3 Encounters:  07/21/20 120/84  06/03/20 118/60  03/27/20 124/70  -Controlled -Current treatment: Metoprolol '25mg'$  BID Lisinopril '20mg'$  QD -Medications previously tried: N/A  -Current home readings: Not testing -Current dietary habits: "Tries to eat healthy" -Current exercise habits: Walking her dog -Denies hypotensive/hypertensive  symptoms -Educated on  : Extensive time spent on counseling patient on blood pressure goal and impact of each antihypertensive medication on their blood pressure and risk reduction for CV disease.  Used analogies to explain the need for multiple antihypertensive medications to achieve BP goals and that it is often a silent disease with no symptoms.  -Counseled to monitor BP at home, document, and provide log at future appointments -Recommended to continue current medication  Hyperlipidemia: (LDL goal < 100) Lab Results  Component Value Date   CHOL 160 07/21/2020   CHOL 167 12/24/2019   CHOL 162 06/25/2019   Lab Results  Component Value Date   HDL 70 07/21/2020   HDL 60 12/24/2019   HDL 65 06/25/2019   Lab Results  Component Value Date   LDLCALC 68 07/21/2020   LDLCALC 82 12/24/2019   LDLCALC 80 06/25/2019   Lab Results  Component Value Date   TRIG 127 07/21/2020   TRIG 144 12/24/2019   TRIG 91 06/25/2019   Lab Results  Component Value Date   CHOLHDL 2.3 07/21/2020   CHOLHDL 2.8 12/24/2019   CHOLHDL 2.5 06/25/2019  No results found for: LDLDIRECT -Controlled -Current treatment: Rosuvastatin '40mg'$  Ezetimibe '10mg'$  -Medications previously tried: N/A  -Current dietary habits: "Tries to eat healthy" -Current exercise habits: Walking her dog -Educated on: Over 15 minutes spent counseling patient on role of hyperlipidemia on heart disease and the impact of each lipid subclass with emphasis on LDL and heart disease risk.  Explained role of statins in prevention of CV disease complications and actual risk for adverse effects with statin treatment.  Counseled patient on genetic component placing them at risk for hyperlipidemia. -Recommended to continue current medication  Diabetes (A1c goal <8%) Lab Results  Component Value Date   HGBA1C 8.3 (H) 07/21/2020   HGBA1C 7.9 (H) 12/24/2019   HGBA1C 7.3 (H) 06/25/2019   Lab Results  Component Value Date   MICROALBUR 30 07/21/2020    LDLCALC 68 07/21/2020   CREATININE 0.80 07/21/2020   Lab Results  Component Value Date   NA 141 07/21/2020   K 4.9 07/21/2020   CREATININE 0.80 07/21/2020   GFRNONAA 50 (  L) 12/24/2019   GFRAA 57 (L) 12/24/2019   GLUCOSE 174 (H) 07/21/2020   Lab Results  Component Value Date   WBC 6.9 07/21/2020   HGB 14.6 07/21/2020   HCT 44.2 07/21/2020   MCV 87 07/21/2020   PLT 221 07/21/2020  -Controlled -Current medications: Liraglutide 1.'2mg'$  QD (Not from 07/21/20 says to increase to 1.66m Detemir 12 units QD Glimepiride '1mg'$  AM -Medications previously tried: N/A  -Current home glucose readings (Tests daily) fasting glucose: Doesn't write numbers post prandial glucose: only tests fasting -Denies hypoglycemic/hyperglycemic symptoms -Current meal patterns:  breakfast: Didn't specify  lunch: Didn't specify dinner:Didn't specify snacks: Didn't specify drinks: Didn't specify -Current exercise: Walks dog -Educated on A1c and blood sugar goals;  *Extensive time was spent counseling patient on the pathophysiology and risk for complications with uncontrolled Diabetes.  Used teach back method for counseling on ADA diabetic diet.  Time spent on explaining role of carbohydrates versus sugars impacting blood glucose levels and meal planning. Current exercise consists of walking 10 minutes three times a week, plan is to increase to 20 minutes 5 days a week and then after 2-3 weeks increase to 30 minute 5 days a week.  -Counseled to check feet daily and get yearly eye exams Sept 2022:  -Will ask PCP which Liraglutide dose he wishes patient to be on -Will have Concierge check on patient's sugars monthly until we get her below 7 -Will have patient try to schedule f/u with PCP, has no future visits  Depression/Anxiety Depression screen [facility] 11/12/2020 06/03/2020 06/25/2019  Decreased Interest 0 3 1  Down, Depressed, Hopeless 0 0 0  PHQ - 2 Score 0 3 1  Altered sleeping 0 3 0  Tired, decreased  energy 0 2 0  Change in appetite 0 3 0  Feeling bad or failure about yourself  0 0 0  Trouble concentrating 0 0 0  Moving slowly or fidgety/restless 0 0 2  Suicidal thoughts 0 0 0  PHQ-9 Score 0 11 3  Difficult doing work/chores Not difficult at all Somewhat difficult -   GAD 7 : Generalized Anxiety Score 11/12/2020  Nervous, Anxious, on Edge 0  Control/stop worrying 0  Worry too much - different things 0  Trouble relaxing 0  Restless 0  Easily annoyed or irritable 0  Afraid - awful might happen 0  Total GAD 7 Score 0  -Controlled -Current treatment: Mirtazapine (Non compliant since April 2022) -Medications previously tried/failed: N/A -Educated on Benefits of medication for symptom control Sept 2022: Unsure when mirtazapine was started and patient is non-compliant, will ask PCP if it's needed (Considering her age and risk factors)  Osteoporosis / Osteopenia  -Unknown if controlled -Last DEXA Scan: Unknown   T-Score femoral neck: Unknown   T-Score total hip: Unknown   T-Score lumbar spine: Unknown   T-Score forearm radius: Unknown   10-year probability of major osteoporotic fracture: Unknown   10-year probability of hip fracture: Unknown  -Current treatment  Alendronate -Medications previously tried: Unknown  -Recommend [phone] units of vitamin D daily. Recommend 1200 mg of calcium daily from dietary and supplemental sources. September 2022: Will try to find DEXA scores and proceed from there  Meds: -[facility] [facility] There isn't a single med she is 100% compliant on September 2022 Plan:  -Will have patient come into the office in December and bring in all meds so new CPP can meet patient and onboard in person. She is very confused  about what she's supposed to take. She packs her meds herself but stated, "I feel like I make mistakes and I'd like help with it" -Verbal consent obtained for [facility] enhanced pharmacy services  (medication synchronization, adherence packaging, delivery coordination). A medication sync plan was created to allow patient to get all medications delivered once every 30 to 90 days per patient preference. Patient understands they have freedom to choose pharmacy and clinical pharmacist will coordinate care between all prescribers and [facility].   Patient Goals/Self-Care Activities Patient will:  - take medications as prescribed  Follow Up Plan: The patient has been provided with contact information for the care management team and has been advised to call with any health related questions or concerns.       Ms. Mcquire was given information about Chronic Care Management services today including:  CCM service includes personalized support from designated clinical staff supervised by her physician, including individualized plan of care and coordination with other care providers 24/7 contact phone numbers for assistance for urgent and routine care needs. Standard insurance, coinsurance, copays and deductibles apply for chronic care management only during months in which we provide at least 20 minutes of these services. Most insurances cover these services at 100%, however patients may be responsible for any copay, coinsurance and/or deductible if applicable. This service may help you avoid the need for more expensive face-to-face services. Only one practitioner may furnish and bill the service in a calendar month. The patient may stop CCM services at any time (effective at the end of the month) by phone call to the office staff.  Patient agreed to services and verbal consent obtained.   The patient verbalized understanding of instructions, educational materials, and care plan provided today and declined offer to receive copy of patient instructions, educational materials, and care plan.  Face to Face appointment with pharmacist scheduled for: December 2022  Lane Hacker, [facility]

## 2020-11-21 NOTE — Progress Notes (Signed)
Chronic Care Management Pharmacy Note  11/21/2020 Name:  Rebecca Hodge MRN:  [id number] DOB:  Feb 19, 1941  Summary: 80 year old female presents for initial CCM visit. She lives with her daughter. Has 4 daughters, 12 grandkids, and 12 great grandkids. She likes to walk and camp for fun and has a chiwawa named Cookie she enjoys caring for and walking  Recommendations/Changes made from today's visit: -Patient hasn't taken Mirtazapine since May and is very non-compliant. Her PHQ9 came back well recently, could we try [place] this due to her recent PHQ9 and her risk factors with age? -Per your note on 5/9/922 it said to increase Liraflutide to 1.68m but the meds list says 1.2 (Patient is taking 1.274mQD). Which would you prefer? -I couldn't find any DEXA scores for this patient. I saw you made an order but I don't know if she followed through, could we try to get one for her? -She has no f/u scheduled with you and she last saw you in May, could we forward this to scheduling and get her back with you?  Plan: -I will call patient monthly until we get her sugars under 8 -She is very non-complaint, hasn't taken anything consistently out of all her meds. I will have her see the new CPP in person in December where they can start packaging her meds and taking care of her refills    Subjective: Rebecca SMITHERMANs an [age].o. year old female who is a primary patient of PeHenrene PastorLaZeb ComfortMD.  The CCM team was consulted for assistance with disease management and care coordination needs.    Engaged with patient by telephone for initial visit in response to provider referral for pharmacy case management and/or care coordination services.   Consent to Services:  The patient was given the following information about Chronic Care Management services today, agreed to services, and gave verbal consent: 1. CCM service includes personalized support from designated clinical staff supervised by the primary  care provider, including individualized plan of care and coordination with other care providers 2. 24/7 contact phone numbers for assistance for urgent and routine care needs. 3. Service will only be billed when office clinical staff spend 20 minutes or more in a month to coordinate care. 4. Only one practitioner may furnish and bill the service in a calendar month. 5.The patient may stop CCM services at any time (effective at the end of the month) by phone call to the office staff. 6. The patient will be responsible for cost sharing (co-pay) of up to 20% of the service fee (after annual deductible is met). Patient agreed to services and consent obtained.  Patient Care Team: PeLillard AnesMD as PCP - General (Family Medicine) BrBurnice [facility]RP[facility] Pharmacist (Pharmacist) BrBurnice LoganR[facility] Pharmacist (Pharmacist) KeLane HackerRPMids[facility] Pharmacist (Pharmacist)  Recent office visits:  11/12/20- Medicare annual wellness exam 07/21/20- Rebecca Hodge- seen for chronic conditions,  labs ordered, Dexa scan ordered, no medication changes, follow up 6 months  06/03/20- Rebecca Hodge- seen for dysuria, started mirtazipine 15 mg daily at bedtime, follow up 1 month   Recent consult visits:  No visits noted   Hospital visits:  None in previous 6 months   Objective:  Lab Results  Component Value Date   CREATININE 0.80 07/21/2020   BUN 15 07/21/2020   GFRNONAA 50 (L) 12/24/2019   GFRAA 57 (L) 12/24/2019   NA 141 07/21/2020   K  4.9 07/21/2020   CALCIUM 9.6 07/21/2020   CO2 25 07/21/2020   GLUCOSE 174 (H) 07/21/2020    Lab Results  Component Value Date/Time   HGBA1C 8.3 (H) 07/21/2020 11:07 AM   HGBA1C 7.9 (H) 12/24/2019 11:04 AM   MICROALBUR 30 07/21/2020 11:11 AM   MICROALBUR 30 06/25/2019 09:02 AM    Last diabetic Eye exam:  Lab Results  Component Value Date/Time   HMDIABEYEEXA No Retinopathy 03/19/2020 12:00 AM    Last diabetic Foot exam: No results found  for: HMDIABFOOTEX   Lab Results  Component Value Date   CHOL 160 07/21/2020   HDL 70 07/21/2020   LDLCALC 68 07/21/2020   TRIG 127 07/21/2020   CHOLHDL 2.3 07/21/2020    Hepatic Function Latest Ref Rng & Units 07/21/2020 12/24/2019 08/17/2019  Total Protein 6.0 - 8.5 g/dL 6.6 6.8 6.7  Albumin 3.6 - 4.6 g/dL 4.3 4.2 4.2  AST 0 - 40 IU/L 36 24 35  ALT 0 - 32 IU/L 35(H) 19 24  Alk Phosphatase 44 - 121 IU/L 63 65 65  Total Bilirubin 0.0 - 1.2 mg/dL 0.5 0.6 0.5    No results found for: TSH, FREET4  CBC Latest Ref Rng & Units 07/21/2020 12/24/2019 06/25/2019  WBC 3.4 - 10.8 x10E3/uL 6.9 7.3 6.5  Hemoglobin 11.1 - 15.9 g/dL 14.6 14.3 14.3  Hematocrit 34.0 - 46.6 % 44.2 44.4 44.4  Platelets 150 - 450 x10E3/uL 221 222 249    No results found for: VD25OH  Clinical ASCVD: Yes  The ASCVD Risk score (Arnett DK, et al., 2019) failed to calculate for the following reasons:   The 2019 ASCVD risk score is only valid for ages 20 to 75    Depression screen PHQ 2/9 11/12/2020 06/03/2020 06/25/2019  Decreased Interest 0 3 1  Down, Depressed, Hopeless 0 0 0  PHQ - 2 Score 0 3 1  Altered sleeping 0 3 0  Tired, decreased energy 0 2 0  Change in appetite 0 3 0  Feeling bad or failure about yourself  0 0 0  Trouble concentrating 0 0 0  Moving slowly or fidgety/restless 0 0 2  Suicidal thoughts 0 0 0  PHQ-9 Score 0 11 3  Difficult doing work/chores Not difficult at all Somewhat difficult -     Other: (CHADS2VASc if Afib, MMRC or CAT for COPD, ACT, DEXA)  Social History   Tobacco Use  Smoking Status Former   Packs/day: 1.00   Years: 5.00   Pack years: 5.00   Types: Cigarettes   Quit date: 03/16/1987   Years since quitting: 33.7  Smokeless Tobacco Never   BP Readings from Last 3 Encounters:  07/21/20 120/84  06/03/20 118/60  03/27/20 124/70   Pulse Readings from Last 3 Encounters:  07/21/20 62  06/03/20 (!) 54  03/27/20 (!) 53   Wt Readings from Last 3 Encounters:  07/21/20 144 lb  (65.3 kg)  06/03/20 146 lb 3.2 oz (66.3 kg)  03/27/20 146 lb (66.2 kg)   BMI Readings from Last 3 Encounters:  07/21/20 27.66 kg/m  06/03/20 28.08 kg/m  03/27/20 28.04 kg/m    Assessment/Interventions: Review of patient past medical history, allergies, medications, health status, including review of consultants reports, laboratory and other test data, was performed as part of comprehensive evaluation and provision of chronic care management services.   SDOH:  (Social Determinants of Health) assessments and interventions performed: Yes  SDOH Screenings   Alcohol Screen: Not on file  Depression (PHQ2-9):  Low Risk    PHQ-2 Score: 0  Financial Resource Strain: Not on file  Food Insecurity: Not on file  Housing: Not on file  Physical Activity: Not on file  Social Connections: Not on file  Stress: Not on file  Tobacco Use: Medium Risk   Smoking Tobacco Use: Former   Smokeless Tobacco Use: Never  Transportation Needs: Not on file    [place]  Allergies  Allergen Reactions   Nitrofuran Derivatives Hives and Itching   Codeine    Morphine And Related    Sulfa Antibiotics     Medications Reviewed Today     Reviewed by Lane Hacker, [facility] (Pharmacist) on 11/21/20 at [place] List Status: <None>   Medication Order Taking? Sig Documenting Provider Last Dose Status Informant  ACCU-CHEK GUIDE test strip [id number] Yes 1 each by Other route daily. Check blood sugar fasting in the morning daily. Lillard Anes, MD Taking Active   alendronate (FOSAMAX) 70 MG tablet [id number] No TAKE 1 TABLET BY MOUTH WEEKLY ON THE SAME DAY EACH WEEK, WITH A FULL GLASS OF WATER AT LEAST 30 MINUTES BEFORE FIRST FOOD OR BEVERAGE OF THE DAY. REMAIN UPRIGHT FOR AT LEAST 30 MINUTES AFTER TAKING  Patient not taking: Reported on 11/21/2020   Lillard Anes, MD Not Taking Active   BD PEN NEEDLE NANO U/F 32G X 4 MM [place] [id number]  See admin instructions. [provider]  Active    Blood Glucose Monitoring Suppl (ACCU-CHEK GUIDE) w/Device KIT [id number]  1 each by Other route daily. Lillard Anes, MD  Active   estradiol (ESTRACE) 0.1 MG/GM vaginal cream [id number] Yes INSERT 1 APPLICATORFUL VAGINALLY 3 TIMES A WEEK Lillard Anes, MD Taking Active   ezetimibe (ZETIA) 10 MG tablet [id number] Yes Take 1 tablet (10 mg total) by mouth daily. Lillard Anes, MD Taking Active   fluticasone [facility]) 50 MCG/ACT nasal spray [id number] Yes Place 1 spray into both nostrils daily. Lillard Anes, MD Taking Active   gabapentin (NEURONTIN) 300 MG capsule [id number] Yes Take 1 capsule (300 mg total) by mouth 3 (three) times daily. Lillard Anes, MD Taking Active   glimepiride [facility]) 1 MG tablet [id number] Yes Take 1 tablet (1 mg total) by mouth daily with breakfast. Lillard Anes, MD Taking Active   insulin detemir (LEVEMIR FLEXTOUCH) 100 UNIT/ML FlexPen [id number] Yes Inject 12 Units into the skin daily. Lillard Anes, MD Taking Active   Lancets ([place]) lancets [id number] Yes 1 each by Other route daily. Use as instructed Lillard Anes, MD Taking Active   liraglutide (VICTOZA) 18 MG/3ML SOPN [id number] Yes INJECT 0.2 MILLILITERS (1.2 MG TOTAL) INTO THE SKIN DAILY. Lillard Anes, MD Taking Active   lisinopril (ZESTRIL) 20 MG tablet [id number] Yes Take 1 tablet (20 mg total) by mouth daily. Lillard Anes, MD Taking Active   metoprolol tartrate (LOPRESSOR) 25 MG tablet [id number] Yes Take 1 tablet (25 mg total) by mouth 2 (two) times daily. Lillard Anes, MD Taking Active   mirtazapine (REMERON) 15 MG tablet [id number] No Take 1 tablet (15 mg total) by mouth at bedtime.  Patient not taking: Reported on 11/21/2020   Lillard Anes, MD Not Taking Active   pantoprazole (PROTONIX) 40 MG tablet [id number] Yes Take 1 tablet (40 mg total) by mouth daily. Lillard Anes, MD Taking Active    rosuvastatin (CRESTOR) 40 MG tablet [id number] Yes Take 1 tablet (40 mg total) by mouth  at bedtime. Lillard Anes, MD Taking Active             Patient Active Problem List   Diagnosis Date Noted   Sleep disorder 06/03/2020   Acute sinusitis 03/02/2020   Recurrent UTI 11/18/2019   Diffuse abdominal pain 08/17/2019   BMI 27.0-27.9,adult 08/17/2019   Sequelae of poliomyelitis 06/18/2019   Dysphasia following cerebral infarction 06/18/2019   Mixed incontinence 06/18/2019   Type 2 diabetes mellitus with other specified complication ([place]) 40/98/1191   Age-related osteoporosis without current pathological fracture 06/18/2019   Long term (current) use of insulin ([place]) 06/18/2019   Postmenopausal atrophic vaginitis 06/18/2019   Mixed hyperlipidemia 06/18/2019   Rib pain 06/18/2019   Solitary pulmonary nodule 12/13/2012    Immunization History  Administered Date(s) Administered   Fluad Quad(high Dose 65+) 12/24/2019   Influenza Split 12/14/2011, 12/13/2012   Moderna SARS-COV2 Booster Vaccination 06/03/2020   PFIZER(Purple Top)SARS-COV-2 Vaccination 06/21/2019, 07/16/2019   Pneumococcal Conjugate-13 08/31/2017   Pneumococcal Polysaccharide-23 06/11/2016    Conditions to be addressed/monitored:  Hypertension, Hyperlipidemia, Diabetes, and Depression  Care Plan : [place]  Updates made by Lane Hacker, [place] since 11/21/2020 12:00 AM     Problem: DM, HTN, Lipids, Osteopenia   Priority: High  Onset Date: 11/21/2020     Long-Range Goal: Disease State Management   Start Date: 11/21/2020  Expected End Date: 11/21/2021  This Visit's Progress: On track  Priority: High  Note:   Current Barriers:  Unable to achieve control of DM  Does not adhere to prescribed medication regimen Does not maintain contact with provider office Does not contact provider office for questions/concerns  Pharmacist Clinical Goal(s):  Patient will achieve adherence to monitoring  guidelines and medication adherence to achieve therapeutic efficacy contact provider office for questions/concerns as evidenced notation of same in electronic health record through collaboration with PharmD and provider.   Interventions: 1:1 collaboration with Lillard Anes, MD regarding development and update of comprehensive plan of care as evidenced by provider attestation and co-signature Inter-disciplinary care team collaboration (see longitudinal plan of care) Comprehensive medication review performed; medication list updated in electronic medical record  Hypertension (BP goal <140/90) BP Readings from Last 3 Encounters:  07/21/20 120/84  06/03/20 118/60  03/27/20 124/70  -Controlled -Current treatment: Metoprolol 64m BID Lisinopril 244mQD -Medications previously tried: N/A  -Current home readings: Not testing -Current dietary habits: "Tries to eat healthy" -Current exercise habits: Walking her dog -Denies hypotensive/hypertensive symptoms -Educated on  Extensive time spent on counseling patient on blood pressure goal and impact of each antihypertensive medication on their blood pressure and risk reduction for CV disease.  Used analogies to explain the need for multiple antihypertensive medications to achieve BP goals and that it is often a silent disease with no symptoms.  -Counseled to monitor BP at home, document, and provide log at future appointments -Recommended to continue current medication  Hyperlipidemia: (LDL goal < 100) Lab Results  Component Value Date   CHOL 160 07/21/2020   CHOL 167 12/24/2019   CHOL 162 06/25/2019   Lab Results  Component Value Date   HDL 70 07/21/2020   HDL 60 12/24/2019   HDL 65 06/25/2019   Lab Results  Component Value Date   LDLCALC 68 07/21/2020   LDLCALC 82 12/24/2019   LDLCALC 80 06/25/2019   Lab Results  Component Value Date   TRIG 127 07/21/2020   TRIG 144 12/24/2019   TRIG 91 06/25/2019   Lab  Results   Component Value Date   CHOLHDL 2.3 07/21/2020   CHOLHDL 2.8 12/24/2019   CHOLHDL 2.5 06/25/2019  No results found for: LDLDIRECT -Controlled -Current treatment: Rosuvastatin 75m Ezetimibe 127m-Medications previously tried: N/A  -Current dietary habits: "Tries to eat healthy" -Current exercise habits: Walking her dog -Educated on: Over 15 minutes spent counseling patient on role of hyperlipidemia on heart disease and the impact of each lipid subclass with emphasis on LDL and heart disease risk.  Explained role of statins in prevention of CV disease complications and actual risk for adverse effects with statin treatment.  Counseled patient on genetic component placing them at risk for hyperlipidemia. -Recommended to continue current medication  Diabetes (A1c goal <8%) Lab Results  Component Value Date   HGBA1C 8.3 (H) 07/21/2020   HGBA1C 7.9 (H) 12/24/2019   HGBA1C 7.3 (H) 06/25/2019   Lab Results  Component Value Date   MICROALBUR 30 07/21/2020   LDLCALC 68 07/21/2020   CREATININE 0.80 07/21/2020   Lab Results  Component Value Date   NA 141 07/21/2020   K 4.9 07/21/2020   CREATININE 0.80 07/21/2020   GFRNONAA 50 (L) 12/24/2019   GFRAA 57 (L) 12/24/2019   GLUCOSE 174 (H) 07/21/2020   Lab Results  Component Value Date   WBC 6.9 07/21/2020   HGB 14.6 07/21/2020   HCT 44.2 07/21/2020   MCV 87 07/21/2020   PLT 221 07/21/2020  -Controlled -Current medications: Liraglutide 1.75m43mD (Not from 07/21/20 says to increase to 1.8mh76mtemir 12 units QD Glimepiride 1mg 70m-Medications previously tried: N/A  -Current home glucose readings (Tests daily) fasting glucose: Doesn't write numbers post prandial glucose: only tests fasting -Denies hypoglycemic/hyperglycemic symptoms -Current meal patterns:  breakfast: Didn't specify  lunch: Didn't specify dinner:Didn't specify snacks: Didn't specify drinks: Didn't specify -Current exercise: Walks dog -Educated on A1c and blood  sugar goals;  *Extensive time was spent counseling patient on the pathophysiology and risk for complications with uncontrolled Diabetes.  Used teach back method for counseling on ADA diabetic diet.  Time spent on explaining role of carbohydrates versus sugars impacting blood glucose levels and meal planning. Current exercise consists of walking 10 minutes three times a week, plan is to increase to 20 minutes 5 days a week and then after 2-3 weeks increase to 30 minute 5 days a week.  -Counseled to check feet daily and get yearly eye exams Sept 2022:  -Will ask PCP which Liraglutide dose he wishes patient to be on -Will have Concierge check on patient's sugars monthly until we get her below 7 -Will have patient try to schedule f/u with PCP, has no future visits  Depression/Anxiety Depression screen [facility] 06/03/2020 06/25/2019  Decreased Interest 0 3 1  Down, Depressed, Hopeless 0 0 0  PHQ - 2 Score 0 3 1  Altered sleeping 0 3 0  Tired, decreased energy 0 2 0  Change in appetite 0 3 0  Feeling bad or failure about yourself  0 0 0  Trouble concentrating 0 0 0  Moving slowly or fidgety/restless 0 0 2  Suicidal thoughts 0 0 0  PHQ-9 Score 0 11 3  Difficult doing work/chores Not difficult at all Somewhat difficult -   GAD 7 : Generalized Anxiety Score 11/12/2020  Nervous, Anxious, on Edge 0  Control/stop worrying 0  Worry too much - different things 0  Trouble relaxing 0  Restless 0  Easily annoyed or irritable 0  Afraid - awful might happen  0  Total GAD 7 Score 0  -Controlled -Current treatment: Mirtazapine (Non compliant since April 2022) -Medications previously tried/failed: N/A -Educated on Benefits of medication for symptom control Sept 2022: Unsure when mirtazapine was started and patient is non-compliant, will ask PCP if it's needed (Considering her age and risk factors)  Osteoporosis / Osteopenia  -Unknown if controlled -Last DEXA Scan: Unknown   T-Score femoral  neck: Unknown   T-Score total hip: Unknown   T-Score lumbar spine: Unknown   T-Score forearm radius: Unknown   10-year probability of major osteoporotic fracture: Unknown   10-year probability of hip fracture: Unknown  -Current treatment  Alendronate -Medications previously tried: Unknown  -Recommend [phone] units of vitamin D daily. Recommend 1200 mg of calcium daily from dietary and supplemental sources. September 2022: Will try to find DEXA scores and proceed from there  Meds: -Patient [facility] and is very non-compliant. There isn't a single med she is 100% compliant on September 2022 Plan:  -Will have patient come into the office in December and bring in all meds so new CPP can meet patient and onboard in person. She is very confused about what she's supposed to take. She packs her meds herself but stated, "I feel like I make mistakes and I'd like help with it" -Verbal consent obtained for [facility] enhanced pharmacy services (medication synchronization, adherence packaging, delivery coordination). A medication sync plan was created to allow patient to get all medications delivered once every 30 to 90 days per patient preference. Patient understands they have freedom to choose pharmacy and clinical pharmacist will coordinate care between all prescribers and [facility].   Patient Goals/Self-Care Activities Patient will:  - take medications as prescribed  Follow Up Plan: The patient has been provided with contact information for the care management team and has been advised to call with any health related questions or concerns.        Medication Assistance: Utilizing [facility]acy [facility] (Would like to start getting meds packed in December 2022.  Compliance/Adherence/Medication fill history: Care Gaps: Last annual wellness visit? 75/10/25 If applicable: Last eye exam / retinopathy screening? 07/21/20 Last diabetic foot exam?  07/21/20  Star-Rating Drugs: -Rosuvastatin -Metoprolol -Lisinopril -Glimepiride (^non-compliant on all the above)  Patient's preferred pharmacy is:  [facility] [place], [place] - [address] AT [place] [address] [place] [address] Phone: [phone] Fax: [phone]  Uses pill box? Yes Pt endorses 100% compliance (Fill Hx shows she's non-compliant  We discussed: Benefits of medication synchronization, packaging and delivery as well as enhanced pharmacist oversight with [facility]. Patient decided to: Utilize [facility] for medication synchronization, packaging and delivery  Care Plan and Follow Up Patient Decision:  Patient agrees to Care Plan and Follow-up.  Plan: The patient has been provided with contact information for the care management team and has been advised to call with any health related questions or concerns.   Care Plan : [place]  Updates made by Lane Hacker, RPH since 11/21/2020 12:00 AM     Problem: DM, HTN, Lipids, Osteopenia   Priority: High  Onset Date: 11/21/2020     Long-Range Goal: Disease State Management   Start Date: 11/21/2020  Expected End Date: 11/21/2021  This Visit's Progress: On track  Priority: High  Note:   Current Barriers:  Unable to achieve control of DM  Does not adhere to prescribed medication regimen Does not maintain contact with  provider office Does not contact provider office for questions/concerns  Pharmacist Clinical Goal(s):  Patient will achieve adherence to monitoring guidelines and medication adherence to achieve therapeutic efficacy contact provider office for questions/concerns as evidenced notation of same in electronic health record through collaboration with PharmD and provider.   Interventions: 1:1 collaboration with Lillard Anes, MD regarding development and update of comprehensive plan of care as evidenced by provider attestation and  co-signature Inter-disciplinary care team collaboration (see longitudinal plan of care) Comprehensive medication review performed; medication list updated in electronic medical record  Hypertension (BP goal <140/90) BP Readings from Last 3 Encounters:  07/21/20 120/84  06/03/20 118/60  03/27/20 124/70  -Controlled -Current treatment: Metoprolol 79m BID Lisinopril 273mQD -Medications previously tried: N/A  -Current home readings: Not testing -Current dietary habits: "Tries to eat healthy" -Current exercise habits: Walking her dog -Denies hypotensive/hypertensive symptoms -Educated on  Extensive time spent on counseling patient on blood pressure goal and impact of each antihypertensive medication on their blood pressure and risk reduction for CV disease.  Used analogies to explain the need for multiple antihypertensive medications to achieve BP goals and that it is often a silent disease with no symptoms.  -Counseled to monitor BP at home, document, and provide log at future appointments -Recommended to continue current medication  Hyperlipidemia: (LDL goal < 100) Lab Results  Component Value Date   CHOL 160 07/21/2020   CHOL 167 12/24/2019   CHOL 162 06/25/2019   Lab Results  Component Value Date   HDL 70 07/21/2020   HDL 60 12/24/2019   HDL 65 06/25/2019   Lab Results  Component Value Date   LDLCALC 68 07/21/2020   LDLCALC 82 12/24/2019   LDLCALC 80 06/25/2019   Lab Results  Component Value Date   TRIG 127 07/21/2020   TRIG 144 12/24/2019   TRIG 91 06/25/2019   Lab Results  Component Value Date   CHOLHDL 2.3 07/21/2020   CHOLHDL 2.8 12/24/2019   CHOLHDL 2.5 06/25/2019  No results found for: LDLDIRECT -Controlled -Current treatment: Rosuvastatin 4038mzetimibe 70m39medications previously tried: N/A  -Current dietary habits: "Tries to eat healthy" -Current exercise habits: Walking her dog -Educated on: Over 15 minutes spent counseling patient on role of  hyperlipidemia on heart disease and the impact of each lipid subclass with emphasis on LDL and heart disease risk.  Explained role of statins in prevention of CV disease complications and actual risk for adverse effects with statin treatment.  Counseled patient on genetic component placing them at risk for hyperlipidemia. -Recommended to continue current medication  Diabetes (A1c goal <8%) Lab Results  Component Value Date   HGBA1C 8.3 (H) 07/21/2020   HGBA1C 7.9 (H) 12/24/2019   HGBA1C 7.3 (H) 06/25/2019   Lab Results  Component Value Date   MICROALBUR 30 07/21/2020   LDLCALC 68 07/21/2020   CREATININE 0.80 07/21/2020   Lab Results  Component Value Date   NA 141 07/21/2020   K 4.9 07/21/2020   CREATININE 0.80 07/21/2020   GFRNONAA 50 (L) 12/24/2019   GFRAA 57 (L) 12/24/2019   GLUCOSE 174 (H) 07/21/2020   Lab Results  Component Value Date   WBC 6.9 07/21/2020   HGB 14.6 07/21/2020   HCT 44.2 07/21/2020   MCV 87 07/21/2020   PLT 221 07/21/2020  -Controlled -Current medications: Liraglutide 1.2mg 34m(Not from 07/21/20 says to increase to 1.8mh D57mmir 12 units QD Glimepiride 1mg AM7medications previously tried: N/A  -Current home glucose readings (  Tests daily) fasting glucose: Doesn't write numbers post prandial glucose: only tests fasting -Denies hypoglycemic/hyperglycemic symptoms -Current meal patterns:  breakfast: Didn't specify  lunch: Didn't specify dinner:Didn't specify snacks: Didn't specify drinks: Didn't specify -Current exercise: Walks dog -Educated on A1c and blood sugar goals;  *Extensive time was spent counseling patient on the pathophysiology and risk for complications with uncontrolled Diabetes.  Used teach back method for counseling on ADA diabetic diet.  Time spent on explaining role of carbohydrates versus sugars impacting blood glucose levels and meal planning. Current exercise consists of walking 10 minutes three times a week, plan is to increase to  20 minutes 5 days a week and then after 2-3 weeks increase to 30 minute 5 days a week.  -Counseled to check feet daily and get yearly eye exams Sept 2022:  -Will ask PCP which Liraglutide dose he wishes patient to be on -Will have Concierge check on patient's sugars monthly until we get her below 7 -Will have patient try to schedule f/u with PCP, has no future visits  Depression/Anxiety Depression screen [facility] 2/9 11/12/2020 06/03/2020 06/25/2019  Decreased Interest 0 3 1  Down, Depressed, Hopeless 0 0 0  PHQ - 2 Score 0 3 1  Altered sleeping 0 3 0  Tired, decreased energy 0 2 0  Change in appetite 0 3 0  Feeling bad or failure about yourself  0 0 0  Trouble concentrating 0 0 0  Moving slowly or fidgety/restless 0 0 2  Suicidal thoughts 0 0 0  PHQ-9 Score 0 11 3  Difficult doing work/chores Not difficult at all Somewhat difficult -   GAD 7 : Generalized Anxiety Score 11/12/2020  Nervous, Anxious, on Edge 0  Control/stop worrying 0  Worry too much - different things 0  Trouble relaxing 0  Restless 0  Easily annoyed or irritable 0  Afraid - awful might happen 0  Total GAD 7 Score 0  -Controlled -Current treatment: Mirtazapine (Non compliant since April 2022) -Medications previously tried/failed: N/A -Educated on Benefits of medication for symptom control Sept 2022: Unsure when mirtazapine was started and patient is non-compliant, will ask PCP if it's needed (Considering her age and risk factors)  Osteoporosis / Osteopenia  -Unknown if controlled -Last DEXA Scan: Unknown   T-Score femoral neck: Unknown   T-Score total hip: Unknown   T-Score lumbar spine: Unknown   T-Score forearm radius: Unknown   10-year probability of major osteoporotic fracture: Unknown   10-year probability of hip fracture: Unknown  -Current treatment  Alendronate -Medications previously tried: Unknown  -Recommend [id number] units of vitamin D daily. Recommend 1200 mg of calcium daily from dietary and  supplemental sources. September 2022: Will try to find DEXA scores and proceed from there  Meds: -Patient gets meds from [facility] and is very non-compliant. There isn't a single med she is 100% compliant on September 2022 Plan:  -Will have patient come into the office in December and bring in all meds so new CPP can meet patient and onboard in person. She is very confused about what she's supposed to take. She packs her meds herself but stated, "I feel like I make mistakes and I'd like help with it" -Verbal consent obtained for [facility] enhanced pharmacy services (medication synchronization, adherence packaging, delivery coordination). A medication sync plan was created to allow patient to get all medications delivered once every 30 to 90 days per patient preference. Patient understands they have freedom to choose pharmacy and clinical pharmacist will coordinate care  between all prescribers and [facility].   Patient Goals/Self-Care Activities Patient will:  - take medications as prescribed  Follow Up Plan: The patient has been provided with contact information for the care management team and has been advised to call with any health related questions or concerns.

## 2020-11-24 ENCOUNTER — Other Ambulatory Visit: Payer: Self-pay | Admitting: Legal Medicine

## 2020-11-28 DIAGNOSIS — D0462 Carcinoma in situ of skin of left upper limb, including shoulder: Secondary | ICD-10-CM | POA: Diagnosis not present

## 2020-12-03 ENCOUNTER — Other Ambulatory Visit: Payer: Self-pay | Admitting: Legal Medicine

## 2020-12-03 DIAGNOSIS — N390 Urinary tract infection, site not specified: Secondary | ICD-10-CM

## 2020-12-08 DIAGNOSIS — E1169 Type 2 diabetes mellitus with other specified complication: Secondary | ICD-10-CM | POA: Diagnosis not present

## 2020-12-12 DIAGNOSIS — E1169 Type 2 diabetes mellitus with other specified complication: Secondary | ICD-10-CM

## 2020-12-12 DIAGNOSIS — E782 Mixed hyperlipidemia: Secondary | ICD-10-CM | POA: Diagnosis not present

## 2020-12-12 DIAGNOSIS — Z794 Long term (current) use of insulin: Secondary | ICD-10-CM

## 2020-12-15 DIAGNOSIS — R32 Unspecified urinary incontinence: Secondary | ICD-10-CM | POA: Diagnosis not present

## 2020-12-23 ENCOUNTER — Other Ambulatory Visit: Payer: Self-pay | Admitting: Nurse Practitioner

## 2020-12-23 ENCOUNTER — Ambulatory Visit (INDEPENDENT_AMBULATORY_CARE_PROVIDER_SITE_OTHER): Payer: Medicare HMO | Admitting: Nurse Practitioner

## 2020-12-23 ENCOUNTER — Encounter: Payer: Self-pay | Admitting: Nurse Practitioner

## 2020-12-23 ENCOUNTER — Other Ambulatory Visit: Payer: Self-pay

## 2020-12-23 VITALS — BP 130/72 | HR 65 | Temp 97.4°F | Ht 61.0 in | Wt 141.0 lb

## 2020-12-23 DIAGNOSIS — N390 Urinary tract infection, site not specified: Secondary | ICD-10-CM

## 2020-12-23 DIAGNOSIS — R350 Frequency of micturition: Secondary | ICD-10-CM | POA: Diagnosis not present

## 2020-12-23 DIAGNOSIS — N952 Postmenopausal atrophic vaginitis: Secondary | ICD-10-CM

## 2020-12-23 LAB — POCT URINALYSIS DIPSTICK
Bilirubin, UA: NEGATIVE
Blood, UA: NEGATIVE
Glucose, UA: NEGATIVE
Ketones, UA: NEGATIVE
Leukocytes, UA: NEGATIVE
Nitrite, UA: NEGATIVE
Protein, UA: NEGATIVE
Spec Grav, UA: 1.015 (ref 1.010–1.025)
Urobilinogen, UA: 0.2 E.U./dL
pH, UA: 6 (ref 5.0–8.0)

## 2020-12-23 MED ORDER — ESTRADIOL 0.1 MG/GM VA CREA: TOPICAL_CREAM | VAGINAL | 3 refills | Status: DC

## 2020-12-23 MED ORDER — NITROFURANTOIN MACROCRYSTAL 50 MG PO CAPS: ORAL_CAPSULE | ORAL | 2 refills | Status: DC

## 2020-12-23 NOTE — Patient Instructions (Signed)
Use Estrogen vaginal cream as prescribed Take Macrobid as prescribed Push fluids, especially water Avoid holding your urine when you feel the urge to go  Follow-up as needed   Atrophic Vaginitis Atrophic vaginitis is when the lining of the vagina becomes dry and thin. This is most common in women who have stopped having their periods (are in menopause). It usually starts when a woman is 86 to 82 years old. What are the causes? This condition is caused by a drop in a female hormone (estrogen). What increases the risk? You are more likely to develop this condition if: You take certain medicines. You have had your ovaries taken out. You are being treated for cancer. You have given birth or are breastfeeding. You are more than 82 years old. You smoke. What are the signs or symptoms? Pain during sex. A feeling of pressure during sex. Bleeding during sex. Burning or itching in the vagina. Burning pain when you pee (urinate). Fluid coming from your vagina. Some people do not have symptoms. How is this treated? Using a lubricant before sex. Using a moisturizer in the vagina. Using estrogen in the vagina. In some cases, you may not need treatment. Follow these instructions at home: Medicines Take all medicines only as told by your doctor. This includes medicines for dryness. Do not use herbal medicines unless your doctor says it is okay. General instructions Talk with your doctor about treatment. Do not douche. Do not use scented: Sprays. Tampons. Soaps. If sex hurts, try using lubricants right before you have sex. Contact a doctor if: You have fluid coming from the vagina that is not like normal. You have a bad smell coming from your vagina. You have new symptoms. Your symptoms do not get better when treated. Your symptoms get worse. Summary This condition happens when the lining of the vagina becomes dry and thin. It is most common in women who no longer have  periods. Treatment may include using medicines for dryness. Call a doctor if your symptoms do not get better. This information is not intended to replace advice given to you by your health care provider. Make sure you discuss any questions you have with your health care provider. Document Revised: 08/30/2019 Document Reviewed: 08/30/2019 Elsevier Patient Education  [place].

## 2020-12-23 NOTE — Progress Notes (Signed)
Acute Office Visit  Subjective:    Patient ID: Rebecca Hodge, female    DOB: [birth year], 80 y.o.   MRN: [id number]  Chief Complaint  Patient presents with   Urinary Frequency    HPI Patient is in today for urinary frequency, urgency, and pelvic discomfort. She is accompanied by her adult daughter. States she has recurrent UTIs. Onset of symptoms was 1 week ago. Denies previous treatment. EMR revealed Macrobid 50 mg QD. States she has not been taking Macrobid.    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  Social History   Socioeconomic History   Marital status: Widowed    Spouse name: Not on file   Number of children: Not on file   Years of education: Not on file   Highest education level: Not on file  Occupational History   Occupation: Disabled  Tobacco Use   Smoking status: Former    Packs/day: 1.00    Years: 5.00    Pack years: 5.00    Types: Cigarettes    Quit date: 03/16/1987    Years since quitting: 33.7   Smokeless tobacco: Never  Substance and Sexual Activity   Alcohol use: No   Drug use: No   Sexual activity: Not Currently  Other Topics Concern   Not on file  Social History Narrative   Not on file   Social Determinants of Health   Financial Resource Strain: Not on file  Food Insecurity: Not on file  Transportation Needs: Not on file  Physical Activity: Not on file  Stress: Not on  file  Social Connections: Not on file  Intimate Partner Violence: Not on file    Outpatient Medications Prior to Visit  Medication Sig Dispense Refill   ACCU-CHEK GUIDE test strip 1 each by Other route daily. Check blood sugar fasting in the morning daily. 100 each 2   alendronate (FOSAMAX) 70 MG tablet TAKE 1 TABLET BY MOUTH WEEKLY ON THE SAME DAY EACH WEEK, WITH A FULL GLASS OF WATER AT LEAST 30 MINUTES BEFORE FIRST FOOD OR BEVERAGE OF THE DAY. REMAIN UPRIGHT FOR AT LEAST 30 MINUTES AFTER TAKING (Patient not taking: Reported on 11/21/2020) 4 tablet 6   BD PEN NEEDLE NANO U/F 32G X 4 MM MISC See admin instructions.     Blood Glucose Monitoring Suppl (ACCU-CHEK GUIDE) w/Device KIT 1 each by Other route daily. 1 kit 0   estradiol (ESTRACE) 0.1 MG/GM vaginal cream INSERT 1 APPLICATORFUL VAGINALLY 3 TIMES A WEEK 42.5 g 3   ezetimibe (ZETIA) 10 MG tablet Take 1 tablet (10 mg total) by mouth daily. 30 tablet 6   fluticasone (FLONASE) 50 MCG/ACT nasal spray INSTILL 1 SPRAY IN EACH NOSTRIL DAILY 16 mL 6   gabapentin (NEURONTIN) 300 MG capsule Take 1 capsule (300 mg total) by mouth 3 (three) times daily. 90 capsule 6   glimepiride (AMARYL) 1 MG tablet  Take 1 tablet (1 mg total) by mouth daily with breakfast. 30 tablet 3   insulin detemir (LEVEMIR FLEXTOUCH) 100 UNIT/ML FlexPen Inject 12 Units into the skin daily. 15 mL 3   Lancets (ONETOUCH ULTRASOFT) lancets 1 each by Other route daily. Use as instructed 100 each 2   liraglutide (VICTOZA) 18 MG/3ML SOPN INJECT 0.2 MILLILITERS (1.2 MG TOTAL) INTO THE SKIN DAILY. 6 mL 3   lisinopril (ZESTRIL) 20 MG tablet Take 1 tablet (20 mg total) by mouth daily. 30 tablet 3   metoprolol tartrate (LOPRESSOR) 25 MG tablet Take 1 tablet (25 mg total) by mouth 2 (two) times daily. 60 tablet 6   mirtazapine (REMERON) 15 MG tablet Take 1 tablet (15 mg total) by mouth at bedtime. (Patient not taking: Reported on 11/21/2020) 30 tablet 3   nitrofurantoin (MACRODANTIN) 50 MG  capsule TAKE 1 CAPSULE(50 MG) BY MOUTH AT BEDTIME 90 capsule 2   pantoprazole (PROTONIX) 40 MG tablet Take 1 tablet (40 mg total) by mouth daily. 30 tablet 6   rosuvastatin (CRESTOR) 40 MG tablet Take 1 tablet (40 mg total) by mouth at bedtime. 30 tablet 6   No facility-administered medications prior to visit.    Allergies  Allergen Reactions   Nitrofuran Derivatives Hives and Itching   Codeine    Morphine And Related    Sulfa Antibiotics     Review of Systems  Constitutional:  Negative for chills and fever.  HENT:  Negative for congestion and sore throat.   Respiratory:  Negative for cough and shortness of breath.   Cardiovascular:  Negative for chest pain.  Gastrointestinal:  Positive for abdominal pain (pressure). Negative for nausea.  Genitourinary:  Positive for frequency and urgency. Negative for dysuria and flank pain.  Musculoskeletal:  Positive for back pain.      Objective:    Physical Exam Vitals reviewed.  Pulmonary:     Effort: Pulmonary effort is normal.  Abdominal:     Tenderness: There is no right CVA tenderness or left CVA tenderness.  Skin:    General: Skin is warm and dry.     Capillary Refill: Capillary refill takes less than 2 seconds.  Neurological:     General: No focal deficit present.     Mental Status: She is alert and oriented to person, place, and time.  Psychiatric:        Mood and Affect: Mood normal.        Behavior: Behavior normal.   BP 130/72   Pulse 65   Temp (!) 97.4 F (36.3 C)   Ht 5' 1"  (1.549 m)   Wt 141 lb (64 kg)   LMP  (LMP Unknown)   SpO2 95%   BMI 26.64 kg/m  Wt Readings from Last 3 Encounters:  12/23/20 141 lb (64 kg)  07/21/20 144 lb (65.3 kg)  06/03/20 146 lb 3.2 oz (66.3 kg)    Health Maintenance Due  Topic Date Due   TETANUS/TDAP  Never done   Zoster Vaccines- Shingrix (1 of 2) Never done   MAMMOGRAM  11/30/2019   COVID-19 Vaccine (4 - Booster for Pfizer series) 10/03/2020   INFLUENZA VACCINE   10/13/2020    Lab Results  Component Value Date   WBC 6.9 07/21/2020   HGB 14.6 07/21/2020   HCT 44.2 07/21/2020   MCV 87 07/21/2020   PLT 221 07/21/2020   Lab Results  Component Value Date   NA 141 07/21/2020   K 4.9 07/21/2020   CO2  25 07/21/2020   GLUCOSE 174 (H) 07/21/2020   BUN 15 07/21/2020   CREATININE 0.80 07/21/2020   BILITOT 0.5 07/21/2020   ALKPHOS 63 07/21/2020   AST 36 07/21/2020   ALT 35 (H) 07/21/2020   PROT 6.6 07/21/2020   ALBUMIN 4.3 07/21/2020   CALCIUM 9.6 07/21/2020   EGFR 74 07/21/2020   Lab Results  Component Value Date   CHOL 160 07/21/2020   Lab Results  Component Value Date   HDL 70 07/21/2020   Lab Results  Component Value Date   LDLCALC 68 07/21/2020   Lab Results  Component Value Date   TRIG 127 07/21/2020   Lab Results  Component Value Date   CHOLHDL 2.3 07/21/2020   Lab Results  Component Value Date   HGBA1C 8.3 (H) 07/21/2020       Assessment & Plan:   1. Urinary frequency - POCT urinalysis dipstick-Normal UA   Use Estrogen vaginal cream as prescribed Take Macrobid as prescribed Push fluids, especially water Avoid holding your urine when you feel the urge to go  Follow-up as needed     Follow-up: PRN  An After Visit Summary was printed and given to the patient.  I, Rip Harbour, NP, have reviewed all documentation for this visit. The documentation on 12/23/20 for the exam, diagnosis, procedures, and orders are all accurate and complete.   Rip Harbour, NP [place] [phone]

## 2020-12-29 ENCOUNTER — Telehealth: Payer: Self-pay

## 2020-12-29 NOTE — Chronic Care Management (AMB) (Signed)
Chronic Care Management Pharmacy Assistant   Name: Rebecca Hodge  MRN: [id number] DOB: 10/16/1940   Reason for Encounter: Disease State call for DM     Recent office visits:  12/23/20 Jerrell Belfast NP. Seen for UTI. No med changes. Follow up as needed  Recent consult visits:  None since 11/21/20  Hospital visits:  None since 11/21/20  Medications: Outpatient Encounter Medications as of 12/29/2020  Medication Sig   ACCU-CHEK GUIDE test strip 1 each by Other route daily. Check blood sugar fasting in the morning daily.   alendronate (FOSAMAX) 70 MG tablet TAKE 1 TABLET BY MOUTH WEEKLY ON THE SAME DAY EACH WEEK, WITH A FULL GLASS OF WATER AT LEAST 30 MINUTES BEFORE FIRST FOOD OR BEVERAGE OF THE DAY. REMAIN UPRIGHT FOR AT LEAST 30 MINUTES AFTER TAKING (Patient not taking: Reported on 11/21/2020)   BD PEN NEEDLE NANO U/F 32G X 4 MM MISC See admin instructions.   Blood Glucose Monitoring Suppl (ACCU-CHEK GUIDE) w/Device KIT 1 each by Other route daily.   estradiol (ESTRACE) 0.1 MG/GM vaginal cream INSERT 1 APPLICATORFUL VAGINALLY 3 TIMES A WEEK   ezetimibe (ZETIA) 10 MG tablet Take 1 tablet (10 mg total) by mouth daily.   fluticasone (FLONASE) 50 MCG/ACT nasal spray INSTILL 1 SPRAY IN EACH NOSTRIL DAILY   gabapentin (NEURONTIN) 300 MG capsule Take 1 capsule (300 mg total) by mouth 3 (three) times daily.   glimepiride (AMARYL) 1 MG tablet Take 1 tablet (1 mg total) by mouth daily with breakfast.   insulin detemir (LEVEMIR FLEXTOUCH) 100 UNIT/ML FlexPen Inject 12 Units into the skin daily.   Lancets (ONETOUCH ULTRASOFT) lancets 1 each by Other route daily. Use as instructed   liraglutide (VICTOZA) 18 MG/3ML SOPN INJECT 0.2 MILLILITERS (1.2 MG TOTAL) INTO THE SKIN DAILY.   lisinopril (ZESTRIL) 20 MG tablet Take 1 tablet (20 mg total) by mouth daily.   metoprolol tartrate (LOPRESSOR) 25 MG tablet Take 1 tablet (25 mg total) by mouth 2 (two) times daily.   mirtazapine (REMERON) 15 MG tablet  Take 1 tablet (15 mg total) by mouth at bedtime. (Patient not taking: Reported on 11/21/2020)   nitrofurantoin (MACRODANTIN) 50 MG capsule TAKE 1 CAPSULE(50 MG) BY MOUTH AT BEDTIME   pantoprazole (PROTONIX) 40 MG tablet Take 1 tablet (40 mg total) by mouth daily.   rosuvastatin (CRESTOR) 40 MG tablet Take 1 tablet (40 mg total) by mouth at bedtime.   No facility-administered encounter medications on file as of 12/29/2020.   Recent Relevant Labs: Lab Results  Component Value Date/Time   HGBA1C 8.3 (H) 07/21/2020 11:07 AM   HGBA1C 7.9 (H) 12/24/2019 11:04 AM   MICROALBUR 30 07/21/2020 11:11 AM   MICROALBUR 30 06/25/2019 09:02 AM    Kidney Function Lab Results  Component Value Date/Time   CREATININE 0.80 07/21/2020 11:07 AM   CREATININE 1.06 (H) 12/24/2019 11:04 AM   GFRNONAA 50 (L) 12/24/2019 11:04 AM   GFRAA 57 (L) 12/24/2019 11:04 AM     Current antihyperglycemic regimen:  Liraglutide 1.4m QD (Not from 07/21/20 says to increase to 1.819mDetemir 12 units QD Glimepiride 29m62mM  Patient verbally confirms she is taking the above medications as directed. Yes  What recent interventions/DTPs have been made to improve glycemic control:  No changes   Have there been any recent hospitalizations or ED visits since last visit with CPP? No  Patient denies hypoglycemic symptoms, including None  Patient denies hyperglycemic symptoms, including none  How often  are you checking your blood sugar? in the morning before eating or drinking  What are your blood sugars ranging? 101, 102, 103  On insulin? Yes How many units: 12 units   During the week, how often does your blood glucose drop below 70? Pt stated it dropped down to 50 a month ago but pt stated it was her fault. That day she did not eat  anything because she was busy and forgot to take her medication   Are you checking your feet daily/regularly? Yes  Adherence Review: Is the patient currently on a STATIN medication? Yes Is the  patient currently on ACE/ARB medication? Yes Does the patient have >5 day gap between last estimated fill dates? No   Care Gaps: Last eye exam / Retinopathy Screening? Done on 03/19/20 Last Annual Wellness Visit? None noted  Last Diabetic Foot Exam? Done on 07/21/20   Star Rating Drugs:  Medication:  Last Fill: [facility] Victoza   11/24/20 30ds Glimepiride  11/24/20 [place], [place] Pharmacist Assistant  [phone]

## 2020-12-30 NOTE — Telephone Encounter (Signed)
Patient needs AWV, will coordinate to get this taken care of

## 2021-01-07 DIAGNOSIS — E1169 Type 2 diabetes mellitus with other specified complication: Secondary | ICD-10-CM | POA: Diagnosis not present

## 2021-01-09 DIAGNOSIS — D2239 Melanocytic nevi of other parts of face: Secondary | ICD-10-CM | POA: Diagnosis not present

## 2021-01-09 DIAGNOSIS — L820 Inflamed seborrheic keratosis: Secondary | ICD-10-CM | POA: Diagnosis not present

## 2021-01-09 DIAGNOSIS — D225 Melanocytic nevi of trunk: Secondary | ICD-10-CM | POA: Diagnosis not present

## 2021-01-09 DIAGNOSIS — D485 Neoplasm of uncertain behavior of skin: Secondary | ICD-10-CM | POA: Diagnosis not present

## 2021-01-09 DIAGNOSIS — L821 Other seborrheic keratosis: Secondary | ICD-10-CM | POA: Diagnosis not present

## 2021-01-15 ENCOUNTER — Telehealth: Payer: Self-pay

## 2021-01-15 NOTE — Chronic Care Management (AMB) (Addendum)
    Chronic Care Management Pharmacy Assistant   Name: Rebecca Hodge  MRN: [id number] DOB: 01/29/1941  01/15/2021- APPOINTMENT REMINDER   Called Rebecca Hodge was reminded of appointment with Arizona Constable, Pharm. D, for a  telephone visit on 01/16/2021 at 10:00 AM for onboarding with [facility]. Patient would like to keep her current pharmacy and cancel tomorrow's visit. Visit Cancelled.  Rebecca Hodge, [place] Pharmacist Assistant [phone]

## 2021-01-16 ENCOUNTER — Ambulatory Visit: Payer: Medicare HMO

## 2021-01-23 ENCOUNTER — Other Ambulatory Visit: Payer: Self-pay | Admitting: Legal Medicine

## 2021-01-23 DIAGNOSIS — I152 Hypertension secondary to endocrine disorders: Secondary | ICD-10-CM

## 2021-01-23 DIAGNOSIS — E1169 Type 2 diabetes mellitus with other specified complication: Secondary | ICD-10-CM

## 2021-01-23 DIAGNOSIS — Z794 Long term (current) use of insulin: Secondary | ICD-10-CM

## 2021-01-23 DIAGNOSIS — R1084 Generalized abdominal pain: Secondary | ICD-10-CM

## 2021-01-23 DIAGNOSIS — E1159 Type 2 diabetes mellitus with other circulatory complications: Secondary | ICD-10-CM

## 2021-02-06 DIAGNOSIS — E1169 Type 2 diabetes mellitus with other specified complication: Secondary | ICD-10-CM | POA: Diagnosis not present

## 2021-02-16 ENCOUNTER — Other Ambulatory Visit: Payer: Self-pay

## 2021-02-16 ENCOUNTER — Ambulatory Visit (INDEPENDENT_AMBULATORY_CARE_PROVIDER_SITE_OTHER): Payer: Medicare HMO | Admitting: Legal Medicine

## 2021-02-16 ENCOUNTER — Encounter: Payer: Self-pay | Admitting: Legal Medicine

## 2021-02-16 VITALS — BP 135/80 | HR 63 | Temp 98.1°F | Resp 16 | Ht 61.0 in | Wt 140.0 lb

## 2021-02-16 DIAGNOSIS — M810 Age-related osteoporosis without current pathological fracture: Secondary | ICD-10-CM

## 2021-02-16 DIAGNOSIS — E1169 Type 2 diabetes mellitus with other specified complication: Secondary | ICD-10-CM | POA: Diagnosis not present

## 2021-02-16 DIAGNOSIS — N3946 Mixed incontinence: Secondary | ICD-10-CM

## 2021-02-16 DIAGNOSIS — E782 Mixed hyperlipidemia: Secondary | ICD-10-CM

## 2021-02-16 DIAGNOSIS — Z794 Long term (current) use of insulin: Secondary | ICD-10-CM

## 2021-02-16 DIAGNOSIS — B91 Sequelae of poliomyelitis: Secondary | ICD-10-CM

## 2021-02-16 NOTE — Progress Notes (Signed)
Subjective:  Patient ID: Rebecca Hodge, female    DOB: 1941-02-22  Age: 80 y.o. MRN: [id number]  Chief Complaint  Patient presents with   Diabetes   Hyperlipidemia    HPI: chronic visit  Patient present with type 2 diabetes.  Specifically, this is type 2, insulin requiring diabetes, complicated by hypertension and hypercholesterolemia.  Compliance with treatment has been good; patient take medicines as directed, maintains diet and exercise regimen, follows up as directed, and is keeping glucose diary.  Date of  diagnosis 2012victoza 1.2 mg, .  Patient is on lisinopril for renal protection and crestor for cholesterol control.  Patient performs foot exams daily and last ophthalmologic exam was has eye exam.   Patient presents for follow up of hypertension.  Patient tolerating lisinopril well with side effects.  Patient was diagnosed with hypertension 2010 so has been treated for hypertension for 12 years.Patient is working on maintaining diet and exercise regimen and follows up as directed. Complication include none.   Patient presents with hyperlipidemia.  Compliance with treatment has been good; patient takes medicines as directed, maintains low cholesterol diet, follows up as directed, and maintains exercise regimen.  Patient is using crestor without problems.    Current Outpatient Medications on File Prior to Visit  Medication Sig Dispense Refill   ACCU-CHEK GUIDE test strip 1 each by Other route daily. Check blood sugar fasting in the morning daily. 100 each 2   alendronate (FOSAMAX) 70 MG tablet TAKE 1 TABLET BY MOUTH WEEKLY ON THE SAME DAY EACH WEEK, WITH A FULL GLASS OF WATER AT LEAST 30 MINUTES BEFORE FIRST FOOD OR BEVERAGE OF THE DAY. REMAIN UPRIGHT FOR AT LEAST 30 MINUTES AFTER TAKING 4 tablet 6   BD PEN NEEDLE NANO U/F 32G X 4 MM MISC See admin instructions.     Blood Glucose Monitoring Suppl (ACCU-CHEK GUIDE) w/Device KIT 1 each by Other route daily. 1 kit 0   estradiol  (ESTRACE) 0.1 MG/GM vaginal cream INSERT 1 APPLICATORFUL VAGINALLY 3 TIMES A WEEK 42.5 g 3   ezetimibe (ZETIA) 10 MG tablet Take 1 tablet (10 mg total) by mouth daily. 30 tablet 6   fluticasone (FLONASE) 50 MCG/ACT nasal spray INSTILL 1 SPRAY IN EACH NOSTRIL DAILY 16 mL 6   gabapentin (NEURONTIN) 300 MG capsule TAKE 1 CAPSULE BY MOUTH 3 TIMES A DAY 90 capsule 6   glimepiride (AMARYL) 1 MG tablet TAKE 1 TABLET BY MOUTH DAILY BEFORE BREAKFAST 30 tablet 3   insulin detemir (LEVEMIR FLEXTOUCH) 100 UNIT/ML FlexPen Inject 12 Units into the skin daily. 15 mL 3   Lancets (ONETOUCH ULTRASOFT) lancets 1 each by Other route daily. Use as instructed 100 each 2   lisinopril (ZESTRIL) 20 MG tablet Take 1 tablet (20 mg total) by mouth daily. 30 tablet 3   metoprolol tartrate (LOPRESSOR) 25 MG tablet TAKE 1 TABLET BY MOUTH 2 TIMES A DAY 60 tablet 6   mirtazapine (REMERON) 15 MG tablet Take 1 tablet (15 mg total) by mouth at bedtime. 30 tablet 3   nitrofurantoin (MACRODANTIN) 50 MG capsule TAKE 1 CAPSULE(50 MG) BY MOUTH AT BEDTIME 90 capsule 2   pantoprazole (PROTONIX) 40 MG tablet TAKE 1 TABLET BY MOUTH ONCE DAILY 30 tablet 6   rosuvastatin (CRESTOR) 40 MG tablet Take 1 tablet (40 mg total) by mouth at bedtime. 30 tablet 6   liraglutide (VICTOZA) 18 MG/3ML SOPN Inject 1.2 mg into the skin 1 day or 1 dose for 1 dose. INJECT 0.2  MILLILITERS (1.2 MG TOTAL) UNDER THE SKIN DAILY. 9 mL 2   No current facility-administered medications on file prior to visit.   Past Medical History:  Diagnosis Date   Age-related osteoporosis without current pathological fracture 06/18/2019   Allergic rhinitis    Diabetes ([place])    Dysphasia following cerebral infarction 06/18/2019   Hyperlipidemia    Hypertension    Mixed hyperlipidemia 06/18/2019   Mixed incontinence 06/18/2019   Sequelae of poliomyelitis 06/18/2019   Type 2 diabetes mellitus with other specified complication ([place]) 10/14/9560   Past Surgical History:  Procedure  Laterality Date   ABDOMINAL HYSTERECTOMY     CATARACT EXTRACTION Right    CHOLECYSTECTOMY     ROTATOR CUFF REPAIR Bilateral     History reviewed. No pertinent family history. Social History   Socioeconomic History   Marital status: Widowed    Spouse name: Not on file   Number of children: Not on file   Years of education: Not on file   Highest education level: Not on file  Occupational History   Occupation: Disabled  Tobacco Use   Smoking status: Former    Packs/day: 1.00    Years: 5.00    Pack years: 5.00    Types: Cigarettes    Quit date: 03/16/1987    Years since quitting: 33.9   Smokeless tobacco: Never  Substance and Sexual Activity   Alcohol use: No   Drug use: No   Sexual activity: Not Currently  Other Topics Concern   Not on file  Social History Narrative   Not on file   Social Determinants of Health   Financial Resource Strain: Not on file  Food Insecurity: Not on file  Transportation Needs: Not on file  Physical Activity: Not on file  Stress: Not on file  Social Connections: Not on file    Review of Systems  Constitutional:  Negative for chills, fatigue and fever.  HENT:  Negative for congestion, ear pain and sore throat.   Respiratory:  Negative for cough and shortness of breath.   Cardiovascular:  Negative for chest pain and palpitations.  Gastrointestinal:  Positive for abdominal pain (epigastric pain due to hernia). Negative for constipation, diarrhea, nausea and vomiting.  Endocrine: Negative for polydipsia, polyphagia and polyuria.  Genitourinary:  Negative for difficulty urinating and dysuria.  Musculoskeletal:  Negative for arthralgias, back pain and myalgias.  Skin:  Negative for rash.  Neurological:  Negative for headaches.  Psychiatric/Behavioral:  Negative for dysphoric mood. The patient is not nervous/anxious.     Objective:  BP 135/80   Pulse 63   Temp 98.1 F (36.7 C)   Resp 16   Ht 5' 1"  (1.549 m)   Wt 140 lb (63.5 kg)   LMP   (LMP Unknown)   SpO2 97%   BMI 26.45 kg/m   BP/Weight 02/16/2021 13/10/6576 06/19/9627  Systolic BP 528 413 244  Diastolic BP 80 72 84  Wt. (Lbs) 140 141 144  BMI 26.45 26.64 27.66    Physical Exam Vitals reviewed.  Constitutional:      General: She is not in acute distress.    Appearance: Normal appearance.  HENT:     Head: Normocephalic.     Right Ear: Tympanic membrane, ear canal and external ear normal.     Left Ear: Tympanic membrane, ear canal and external ear normal.     Nose: Nose normal.     Mouth/Throat:     Mouth: Mucous membranes are moist.  Pharynx: Oropharynx is clear.  Eyes:     Extraocular Movements: Extraocular movements intact.     Conjunctiva/sclera: Conjunctivae normal.     Pupils: Pupils are equal, round, and reactive to light.  Cardiovascular:     Rate and Rhythm: Normal rate.     Pulses: Normal pulses.     Heart sounds: Normal heart sounds. No murmur heard.   No gallop.  Pulmonary:     Effort: Pulmonary effort is normal. No respiratory distress.     Breath sounds: No wheezing.  Abdominal:     General: Abdomen is flat. Bowel sounds are normal. There is no distension.     Palpations: Abdomen is soft.     Tenderness: There is no abdominal tenderness.  Musculoskeletal:        General: Normal range of motion.     Cervical back: Normal range of motion and neck supple.     Right lower leg: No edema.     Left lower leg: No edema.  Skin:    General: Skin is warm.     Capillary Refill: Capillary refill takes less than 2 seconds.  Neurological:     General: No focal deficit present.     Mental Status: She is alert and oriented to person, place, and time. Mental status is at baseline.     Gait: Gait normal.     Deep Tendon Reflexes: Reflexes normal.    Diabetic Foot Exam - Simple   Simple Foot Form Diabetic Foot exam was performed with the following findings: Yes 02/16/2021 10:42 AM  Visual Inspection No deformities, no ulcerations, no other skin  breakdown bilaterally: Yes Sensation Testing Intact to touch and monofilament testing bilaterally: Yes Pulse Check Posterior Tibialis and Dorsalis pulse intact bilaterally: Yes Comments      Lab Results  Component Value Date   WBC 6.9 07/21/2020   HGB 14.6 07/21/2020   HCT 44.2 07/21/2020   PLT 221 07/21/2020   GLUCOSE 174 (H) 07/21/2020   CHOL 160 07/21/2020   TRIG 127 07/21/2020   HDL 70 07/21/2020   LDLCALC 68 07/21/2020   ALT 35 (H) 07/21/2020   AST 36 07/21/2020   NA 141 07/21/2020   K 4.9 07/21/2020   CL 101 07/21/2020   CREATININE 0.80 07/21/2020   BUN 15 07/21/2020   CO2 25 07/21/2020   HGBA1C 8.3 (H) 07/21/2020   MICROALBUR 30 07/21/2020      Assessment & Plan:   Problem List Items Addressed This Visit       Endocrine   Type 2 diabetes mellitus with other specified complication ([place]) - Primary   Relevant Orders   Comprehensive metabolic panel   Hemoglobin A1c   CBC with Differential/Platelet An individual care plan for diabetes was established and reinforced today.  The patient's status was assessed using clinical findings on exam, labs and diagnostic testing. Patient success at meeting goals based on disease specific evidence-based guidelines and found to be good controlled. Medications were assessed and patient's understanding of the medical issues , including barriers were assessed. Recommend adherence to a diabetic diet, a graduated exercise program, HgbA1c level is checked quarterly, and urine microalbumin performed yearly .  Annual mono-filament sensation testing performed. Lower blood pressure and control hyperlipidemia is important. Get annual eye exams and annual flu shots and smoking cessation discussed.  Self management goals were discussed.      Nervous and Auditory   Sequelae of poliomyelitis Patient has weakness in leg from polio  Musculoskeletal and Integument   Age-related osteoporosis without current pathological fracture AN  INDIVIDUAL CARE PLAN osteoporosis was established and reinforced today.  The patient's status was assessed using clinical findings on exam, labs, and other diagnostic testing. Patient's success at meeting treatment goals based on disease specific evidence-bassed guidelines and found to be in good control. RECOMMENDATIONS include maintaining present medicines and treatment.      Other   Mixed incontinence Patient uses diaper    Long term (current) use of insulin ([place]) Patient using insulin    Mixed hyperlipidemia   Relevant Orders   Lipid panel AN INDIVIDUAL CARE PLAN for hyperlipidemia/ cholesterol was established and reinforced today.  The patient's status was assessed using clinical findings on exam, lab and other diagnostic tests. The patient's disease status was assessed based on evidence-based guidelines and found to be well controlled. MEDICATIONS were reviewed. SELF MANAGEMENT GOALS have been discussed and patient's success at attaining the goal of low cholesterol was assessed. RECOMMENDATION given include regular exercise 3 days a week and low cholesterol/low fat diet. CLINICAL SUMMARY including written plan to identify barriers unique to the patient due to social or economic  reasons was discussed.   .  30 minutes visit   Orders Placed This Encounter  Procedures   Comprehensive metabolic panel   Hemoglobin A1c   Lipid panel   CBC with Differential/Platelet     Follow-up: Return in about 4 months (around 06/17/2021) for fasting.  An After Visit Summary was printed and given to the patient.  Reinaldo Meeker, MD [facility] [phone]

## 2021-02-17 LAB — CBC WITH DIFFERENTIAL/PLATELET
Basophils Absolute: 0 10*3/uL (ref 0.0–0.2)
Basos: 1 %
EOS (ABSOLUTE): 0.1 10*3/uL (ref 0.0–0.4)
Eos: 1 %
Hematocrit: 45.6 % (ref 34.0–46.6)
Hemoglobin: 14.4 g/dL (ref 11.1–15.9)
Immature Grans (Abs): 0 10*3/uL (ref 0.0–0.1)
Immature Granulocytes: 0 %
Lymphocytes Absolute: 2.9 10*3/uL (ref 0.7–3.1)
Lymphs: 33 %
MCH: 27.3 pg (ref 26.6–33.0)
MCHC: 31.6 g/dL (ref 31.5–35.7)
MCV: 87 fL (ref 79–97)
Monocytes Absolute: 0.5 10*3/uL (ref 0.1–0.9)
Monocytes: 6 %
Neutrophils Absolute: 5.2 10*3/uL (ref 1.4–7.0)
Neutrophils: 59 %
Platelets: 230 10*3/uL (ref 150–450)
RBC: 5.27 x10E6/uL (ref 3.77–5.28)
RDW: 14.1 % (ref 11.7–15.4)
WBC: 8.7 10*3/uL (ref 3.4–10.8)

## 2021-02-17 LAB — LIPID PANEL
Chol/HDL Ratio: 2.9 ratio (ref 0.0–4.4)
Cholesterol, Total: 178 mg/dL (ref 100–199)
HDL: 61 mg/dL (ref >39)
LDL Chol Calc (NIH): 97 mg/dL (ref 0–99)
Triglycerides: 114 mg/dL (ref 0–149)
VLDL Cholesterol Cal: 20 mg/dL (ref 5–40)

## 2021-02-17 LAB — COMPREHENSIVE METABOLIC PANEL
ALT: 10 IU/L (ref 0–32)
AST: 18 IU/L (ref 0–40)
Albumin/Globulin Ratio: 2 (ref 1.2–2.2)
Albumin: 4.5 g/dL (ref 3.6–4.6)
Alkaline Phosphatase: 69 IU/L (ref 44–121)
BUN/Creatinine Ratio: 13 (ref 12–28)
BUN: 12 mg/dL (ref 8–27)
Bilirubin Total: 0.6 mg/dL (ref 0.0–1.2)
CO2: 25 mmol/L (ref 20–29)
Calcium: 9.4 mg/dL (ref 8.7–10.3)
Chloride: 107 mmol/L — ABNORMAL HIGH (ref 96–106)
Creatinine, Ser: 0.89 mg/dL (ref 0.57–1.00)
Globulin, Total: 2.3 g/dL (ref 1.5–4.5)
Glucose: 121 mg/dL — ABNORMAL HIGH (ref 70–99)
Potassium: 4.8 mmol/L (ref 3.5–5.2)
Sodium: 145 mmol/L — ABNORMAL HIGH (ref 134–144)
Total Protein: 6.8 g/dL (ref 6.0–8.5)
eGFR: 65 mL/min/{1.73_m2} (ref >59)

## 2021-02-17 LAB — HEMOGLOBIN A1C
Est. average glucose Bld gHb Est-mCnc: 174 mg/dL
Hgb A1c MFr Bld: 7.7 % — ABNORMAL HIGH (ref 4.8–5.6)

## 2021-02-17 LAB — CARDIOVASCULAR RISK ASSESSMENT

## 2021-02-17 NOTE — Progress Notes (Signed)
Glucose 121, kidney tests ok, liver tests ok, A1c 7.7, Cholesterol normal, CBC normal lp

## 2021-02-20 ENCOUNTER — Other Ambulatory Visit: Payer: Self-pay | Admitting: Legal Medicine

## 2021-03-08 DIAGNOSIS — E1169 Type 2 diabetes mellitus with other specified complication: Secondary | ICD-10-CM | POA: Diagnosis not present

## 2021-03-26 ENCOUNTER — Other Ambulatory Visit: Payer: Self-pay | Admitting: Legal Medicine

## 2021-04-07 DIAGNOSIS — E1169 Type 2 diabetes mellitus with other specified complication: Secondary | ICD-10-CM | POA: Diagnosis not present

## 2021-04-22 ENCOUNTER — Other Ambulatory Visit: Payer: Self-pay

## 2021-04-22 ENCOUNTER — Ambulatory Visit (INDEPENDENT_AMBULATORY_CARE_PROVIDER_SITE_OTHER): Payer: Medicare HMO

## 2021-04-22 DIAGNOSIS — E782 Mixed hyperlipidemia: Secondary | ICD-10-CM

## 2021-04-22 DIAGNOSIS — I152 Hypertension secondary to endocrine disorders: Secondary | ICD-10-CM

## 2021-04-22 DIAGNOSIS — Z794 Long term (current) use of insulin: Secondary | ICD-10-CM

## 2021-04-22 DIAGNOSIS — E1169 Type 2 diabetes mellitus with other specified complication: Secondary | ICD-10-CM

## 2021-04-22 DIAGNOSIS — M810 Age-related osteoporosis without current pathological fracture: Secondary | ICD-10-CM

## 2021-04-22 DIAGNOSIS — F339 Major depressive disorder, recurrent, unspecified: Secondary | ICD-10-CM

## 2021-04-22 DIAGNOSIS — R1084 Generalized abdominal pain: Secondary | ICD-10-CM

## 2021-04-22 MED ORDER — ALENDRONATE SODIUM 70 MG PO TABS: ORAL_TABLET | ORAL | 1 refills | Status: DC

## 2021-04-22 MED ORDER — GLIMEPIRIDE 1 MG PO TABS
1.0000 mg | ORAL_TABLET | Freq: Every day | ORAL | 1 refills | Status: DC
Start: 2021-04-22 — End: 2021-05-22

## 2021-04-22 MED ORDER — METOPROLOL TARTRATE 25 MG PO TABS: 25.0000 mg | ORAL_TABLET | Freq: Two times a day (BID) | ORAL | 2 refills | Status: DC

## 2021-04-22 MED ORDER — MIRTAZAPINE 15 MG PO TABS: 15.0000 mg | ORAL_TABLET | Freq: Every day | ORAL | 2 refills | Status: DC

## 2021-04-22 MED ORDER — EZETIMIBE 10 MG PO TABS: 10.0000 mg | ORAL_TABLET | Freq: Every day | ORAL | 2 refills | Status: DC

## 2021-04-22 MED ORDER — PANTOPRAZOLE SODIUM 40 MG PO TBEC: 40.0000 mg | DELAYED_RELEASE_TABLET | Freq: Every day | ORAL | 2 refills | Status: DC

## 2021-04-22 MED ORDER — VICTOZA 18 MG/3ML ~~LOC~~ SOPN: 1.2000 mg | PEN_INJECTOR | SUBCUTANEOUS | 2 refills | Status: DC

## 2021-04-22 MED ORDER — ROSUVASTATIN CALCIUM 40 MG PO TABS: 40.0000 mg | ORAL_TABLET | Freq: Every day | ORAL | 2 refills | Status: DC

## 2021-04-22 MED ORDER — LISINOPRIL 20 MG PO TABS: 20.0000 mg | ORAL_TABLET | Freq: Every day | ORAL | 1 refills | Status: DC

## 2021-04-22 MED ORDER — ONETOUCH ULTRASOFT LANCETS MISC: 1.0000 | Freq: Every day | 2 refills | Status: DC

## 2021-04-22 MED ORDER — GABAPENTIN 300 MG PO CAPS: 300.0000 mg | ORAL_CAPSULE | Freq: Three times a day (TID) | ORAL | 2 refills | Status: DC

## 2021-04-22 MED ORDER — LEVEMIR FLEXTOUCH 100 UNIT/ML ~~LOC~~ SOPN: 12.0000 [IU] | PEN_INJECTOR | Freq: Every day | SUBCUTANEOUS | 3 refills | Status: DC

## 2021-04-22 NOTE — Progress Notes (Signed)
Chronic Care Management Pharmacy Note  04/22/2021 Name:  Rebecca Hodge MRN:  [id number] DOB:  [birth year]  Summary: 81 year old female presents for f/u CCM visit. She lives with her daughter. Has 4 daughters, 12 grandkids, and 12 great grandkids. She likes to walk and camp for fun and has a chiwawa named Cookie she enjoys caring for and walking  Recommendations/Changes made from today's visit: -Per PCP note on 5/9/922 it said to increase Liraglutide to 1.26m but the meds list says 1.2 (Patient is taking 1.276mQD). Which would you prefer? -No f/u scheduled, will coordinate with Marla  Plan: -I will call patient monthly until we get her sugars under 8 -She is very non-complaint, hasn't taken anything consistently out of all her meds. I will have her see the new CPP in person in December where they can start packaging her meds and taking care of her refills    Subjective: Rebecca JORDAHLs an [age].o. year old female who is a primary patient of PeHenrene PastorLaZeb ComfortMD.  The CCM team was consulted for assistance with disease management and care coordination needs.    Engaged with patient by telephone for follow up visit in response to provider referral for pharmacy case management and/or care coordination services.   Consent to Services:  The patient was given the following information about Chronic Care Management services today, agreed to services, and gave verbal consent: 1. CCM service includes personalized support from designated clinical staff supervised by the primary care provider, including individualized plan of care and coordination with other care providers 2. 24/7 contact phone numbers for assistance for urgent and routine care needs. 3. Service will only be billed when office clinical staff spend 20 minutes or more in a month to coordinate care. 4. Only one practitioner may furnish and bill the service in a calendar month. 5.The patient may stop CCM services at any time  (effective at the end of the month) by phone call to the office staff. 6. The patient will be responsible for cost sharing (co-pay) of up to 20% of the service fee (after annual deductible is met). Patient agreed to services and consent obtained.  Patient Care Team: PeLillard AnesMD as PCP - General (Family Medicine) [facility] Pharmacist (Pharmacist)  Recent office visits:  11/12/20- Medicare annual wellness exam 07/21/20- LaReinaldo MeekerMD- seen for chronic conditions,  labs ordered, Dexa scan ordered, no medication changes, follow up 6 months  06/03/20- LaReinaldo MeekerMD- seen for dysuria, started mirtazipine 15 mg daily at bedtime, follow up 1 month   Recent consult visits:  No visits noted   Hospital visits:  None in previous 6 months   Objective:  Lab Results  Component Value Date   CREATININE 0.89 02/16/2021   BUN 12 02/16/2021   GFRNONAA 50 (L) 12/24/2019   GFRAA 57 (L) 12/24/2019   NA 145 (H) 02/16/2021   K 4.8 02/16/2021   CALCIUM 9.4 02/16/2021   CO2 25 02/16/2021   GLUCOSE 121 (H) 02/16/2021    Lab Results  Component Value Date/Time   HGBA1C 7.7 (H) 02/16/2021 10:49 AM   HGBA1C 8.3 (H) 07/21/2020 11:07 AM   MICROALBUR 30 07/21/2020 11:11 AM   MICROALBUR 30 06/25/2019 09:02 AM    Last diabetic Eye exam:  Lab Results  Component Value Date/Time   HMDIABEYEEXA No Retinopathy 03/19/2020 12:00 AM    Last diabetic Foot exam: No results found for: HMDIABFOOTEX   Lab  Results  Component Value Date   CHOL 178 02/16/2021   HDL 61 02/16/2021   LDLCALC 97 02/16/2021   TRIG 114 02/16/2021   CHOLHDL 2.9 02/16/2021    Hepatic Function Latest Ref Rng & Units 02/16/2021 07/21/2020 12/24/2019  Total Protein 6.0 - 8.5 g/dL 6.8 6.6 6.8  Albumin 3.6 - 4.6 g/dL 4.5 4.3 4.2  AST 0 - 40 IU/L 18 36 24  ALT 0 - 32 IU/L 10 35(H) 19  Alk Phosphatase 44 - 121 IU/L 69 63 65  Total Bilirubin 0.0 - 1.2 mg/dL 0.6 0.5 0.6    No results found for: TSH,  FREET4  CBC Latest Ref Rng & Units 02/16/2021 07/21/2020 12/24/2019  WBC 3.4 - 10.8 x10E3/uL 8.7 6.9 7.3  Hemoglobin 11.1 - 15.9 g/dL 14.4 14.6 14.3  Hematocrit 34.0 - 46.6 % 45.6 44.2 44.4  Platelets 150 - 450 x10E3/uL 230 221 222    No results found for: VD25OH  Clinical ASCVD: Yes  The ASCVD Risk score (Arnett DK, et al., 2019) failed to calculate for the following reasons:   The 2019 ASCVD risk score is only valid for ages 49 to 27    Depression screen PHQ 2/9 12/23/2020 11/12/2020 06/03/2020  Decreased Interest 0 0 3  Down, Depressed, Hopeless 0 0 0  PHQ - 2 Score 0 0 3  Altered sleeping - 0 3  Tired, decreased energy - 0 2  Change in appetite - 0 3  Feeling bad or failure about yourself  - 0 0  Trouble concentrating - 0 0  Moving slowly or fidgety/restless - 0 0  Suicidal thoughts - 0 0  PHQ-9 Score - 0 11  Difficult doing work/chores - Not difficult at all Somewhat difficult     Other: (CHADS2VASc if Afib, MMRC or CAT for COPD, ACT, DEXA)  Social History   Tobacco Use  Smoking Status Former   Packs/day: 1.00   Years: 5.00   Pack years: 5.00   Types: Cigarettes   Quit date: 03/16/1987   Years since quitting: 34.1  Smokeless Tobacco Never   BP Readings from Last 3 Encounters:  02/16/21 135/80  12/23/20 130/72  07/21/20 120/84   Pulse Readings from Last 3 Encounters:  02/16/21 63  12/23/20 65  07/21/20 62   Wt Readings from Last 3 Encounters:  02/16/21 140 lb (63.5 kg)  12/23/20 141 lb (64 kg)  07/21/20 144 lb (65.3 kg)   BMI Readings from Last 3 Encounters:  02/16/21 26.45 kg/m  12/23/20 26.64 kg/m  07/21/20 27.66 kg/m    Assessment/Interventions: Review of patient past medical history, allergies, medications, health status, including review of consultants reports, laboratory and other test data, was performed as part of comprehensive evaluation and provision of chronic care management services.   SDOH:  (Social Determinants of Health) assessments  and interventions performed: Yes SDOH Interventions    Flowsheet Row Most Recent Value  SDOH Interventions   Financial Strain Interventions Intervention Not Indicated  Transportation Interventions Intervention Not Indicated      SDOH Screenings   Alcohol Screen: Low Risk    Last Alcohol Screening Score (AUDIT): 0  Depression (PHQ2-9): Low Risk    PHQ-2 Score: 0  Financial Resource Strain: Low Risk    Difficulty of Paying Living Expenses: Not hard at all  Food Insecurity: Not on file  Housing: Not on file  Physical Activity: Not on file  Social Connections: Not on file  Stress: Not on file  Tobacco Use: Medium Risk  Smoking Tobacco Use: Former   Smokeless Tobacco Use: Never   Passive Exposure: Not on file  Transportation Needs: No Transportation Needs   Lack of Transportation (Medical): No   Lack of Transportation (Non-Medical): No    CCM Care Plan  Allergies  Allergen Reactions   Nitrofuran Derivatives Hives and Itching   Codeine    Morphine And Related    Sulfa Antibiotics     Medications Reviewed Today     Reviewed by Lane Hacker, [facility] (Pharmacist) on 04/22/21 at 1117  Med List Status: <None>   Medication Order Taking? Sig Documenting Provider Last Dose Status Informant  ACCU-CHEK GUIDE test strip [id number] Yes 1 each by Other route daily. Check blood sugar fasting in the morning daily. Lillard Anes, MD Taking Active   alendronate (FOSAMAX) 70 MG tablet [id number] Yes TAKE 1 TABLET BY MOUTH WEEKLY ON THE SAME DAY EACH WEEK, WITH A FULL GLASS OF WATER AT LEAST 30 MINUTES BEFORE FIRST FOOD OR BEVERAGE OF THE DAY. REMAIN UPRIGHT FOR AT LEAST 30 MINUTES AFTER TAKING Lillard Anes, MD Taking Active   BD PEN NEEDLE NANO U/F 32G X 4 MM [place] [id number] Yes See admin instructions. [provider] Taking Active   benzonatate (TESSALON) 100 MG capsule [id number] No TAKE 1 CAPSULE(100 MG) BY MOUTH TWICE DAILY AS NEEDED FOR COUGH  Patient not  taking: Reported on 04/22/2021   Lillard Anes, MD Not Taking Consider Medication Status and Discontinue   Blood Glucose Monitoring Suppl (ACCU-CHEK GUIDE) w/Device KIT [id number] Yes 1 each by Other route daily. Lillard Anes, MD Taking Active   estradiol (ESTRACE) 0.1 MG/GM vaginal cream [id number] Yes INSERT 1 APPLICATORFUL VAGINALLY 3 TIMES A WEEK Heaton, Thornell Mule, NP Taking Active   ezetimibe (ZETIA) 10 MG tablet [id number] Yes Take 1 tablet (10 mg total) by mouth daily. Lillard Anes, MD Taking Active   fluticasone [facility]) 50 MCG/ACT nasal spray [id number] No INSTILL 1 SPRAY IN [facility] NOSTRIL DAILY  Patient not taking: Reported on 04/22/2021   Lillard Anes, MD Not Taking Active   gabapentin (NEURONTIN) 300 MG capsule [id number] Yes TAKE 1 CAPSULE BY MOUTH 3 TIMES A DAY Lillard Anes, MD Taking Active   glimepiride (AMARYL) 1 MG tablet [id number] Yes TAKE 1 TABLET BY MOUTH DAILY BEFORE Wilfred Lacy, MD Taking Active   insulin detemir (LEVEMIR FLEXTOUCH) 100 UNIT/ML FlexPen [id number] Yes Inject 12 Units into the skin daily. Lillard Anes, MD Taking Active   Lancets (Sunshine) lancets [id number] Yes 1 each by Other route daily. Use as instructed Lillard Anes, MD Taking Active   liraglutide (VICTOZA) 18 MG/3ML SOPN [id number]  Inject 1.2 mg into the skin 1 day or 1 dose for 1 dose. INJECT 0.2 MILLILITERS (1.2 MG TOTAL) UNDER THE SKIN DAILY. Lillard Anes, MD  Expired 01/26/21 2359   lisinopril (ZESTRIL) 20 MG tablet [id number] Yes Take 1 tablet (20 mg total) by mouth daily. Lillard Anes, MD Taking Active   metoprolol tartrate (LOPRESSOR) 25 MG tablet [id number] Yes TAKE 1 TABLET BY MOUTH 2 TIMES A DAY Lillard Anes, MD Taking Active   mirtazapine (REMERON) 15 MG tablet [id number] Yes Take 1 tablet (15 mg total) by mouth at bedtime. Lillard Anes, MD Taking Active   nitrofurantoin  (MACRODANTIN) 50 MG capsule [id number] Yes TAKE 1 CAPSULE(50 MG) BY MOUTH AT BEDTIME Rip Harbour, NP Taking Active   pantoprazole (PROTONIX) 40 MG tablet [id number] Yes  TAKE 1 TABLET BY MOUTH ONCE DAILY Lillard Anes, MD Taking Active   rosuvastatin (CRESTOR) 40 MG tablet [id number] Yes TAKE 1 TABLET BY MOUTH EVERY NIGHT AT BEDTIME Lillard Anes, MD Taking Active             Patient Active Problem List   Diagnosis Date Noted   Sleep disorder 06/03/2020   Diffuse abdominal pain 08/17/2019   BMI 27.0-27.9,adult 08/17/2019   Sequelae of poliomyelitis 06/18/2019   Dysphasia following cerebral infarction 06/18/2019   Mixed incontinence 06/18/2019   Type 2 diabetes mellitus with other specified complication ([place]) 88/41/6606   Age-related osteoporosis without current pathological fracture 06/18/2019   Long term (current) use of insulin ([place]) 06/18/2019   Postmenopausal atrophic vaginitis 06/18/2019   Mixed hyperlipidemia 06/18/2019   Rib pain 06/18/2019   Solitary pulmonary nodule 12/13/2012    Immunization History  Administered Date(s) Administered   Fluad Quad(high Dose 65+) 12/24/2019   Influenza Split 12/14/2011, 12/13/2012   Influenza-Unspecified 11/14/2020   Moderna SARS-COV2 Booster Vaccination 06/03/2020   PFIZER(Purple Top)SARS-COV-2 Vaccination 06/21/2019, 07/16/2019   Pneumococcal Conjugate-13 08/31/2017   Pneumococcal Polysaccharide-23 06/11/2016    Conditions to be addressed/monitored:  Hypertension, Hyperlipidemia, Diabetes, and Depression  Care Plan : [place]  Updates made by Lane Hacker, RPH since 04/22/2021 12:00 AM     Problem: DM, HTN, Lipids, Osteopenia   Priority: High  Onset Date: 11/21/2020     Long-Range Goal: Disease State Management   Start Date: 11/21/2020  Expected End Date: 11/21/2021  Recent Progress: On track  Priority: High  Note:   Current Barriers:  Unable to achieve control of DM  Does not adhere  to prescribed medication regimen Does not maintain contact with provider office Does not contact provider office for questions/concerns  Pharmacist Clinical Goal(s):  Patient will achieve adherence to monitoring guidelines and medication adherence to achieve therapeutic efficacy contact provider office for questions/concerns as evidenced notation of same in electronic health record through collaboration with PharmD and provider.   Interventions: 1:1 collaboration with Lillard Anes, MD regarding development and update of comprehensive plan of care as evidenced by provider attestation and co-signature Inter-disciplinary care team collaboration (see longitudinal plan of care) Comprehensive medication review performed; medication list updated in electronic medical record  Hypertension (BP goal <140/90) BP Readings from Last 3 Encounters:  02/16/21 135/80  12/23/20 130/72  07/21/20 120/84  -Controlled -Current treatment: Metoprolol 39m BID Appropriate, Effective, Safe, Accessible Lisinopril 258mQD Appropriate, Effective, Safe, Accessible -Medications previously tried: N/A  -Current home readings: Not testing -Current dietary habits: "Tries to eat healthy" -Current exercise habits: Walking her dog -Denies hypotensive/hypertensive symptoms -Educated on  Extensive time spent on counseling patient on blood pressure goal and impact of each antihypertensive medication on their blood pressure and risk reduction for CV disease.  Used analogies to explain the need for multiple antihypertensive medications to achieve BP goals and that it is often a silent disease with no symptoms.  -Counseled to monitor BP at home, document, and provide log at future appointments -Recommended to continue current medication  Hyperlipidemia: (LDL goal < 100) Lab Results  Component Value Date   CHOL 178 02/16/2021   CHOL 160 07/21/2020   CHOL 167 12/24/2019   Lab Results  Component Value Date   HDL  61 02/16/2021   HDL 70 07/21/2020   HDL 60 12/24/2019   Lab Results  Component Value Date   LDLCALC 97 02/16/2021   LD[place]8 07/21/2020  [place]ivian 82 12/24/2019   Lab Results  Component Value Date   TRIG 114 02/16/2021   TRIG 127 07/21/2020   TRIG 144 12/24/2019   Lab Results  Component Value Date   CHOLHDL 2.9 02/16/2021   CHOLHDL 2.3 07/21/2020   CHOLHDL 2.8 12/24/2019  No results found for: LDLDIRECT -Controlled -Current treatment: Rosuvastatin 50m Appropriate, Effective, Safe, Accessible Ezetimibe 184mAppropriate, Effective, Safe, Accessible -Medications previously tried: N/A  -Current dietary habits: "Tries to eat healthy" -Current exercise habits: Walking her dog -Educated on: Over 15 minutes spent counseling patient on role of hyperlipidemia on heart disease and the impact of each lipid subclass with emphasis on LDL and heart disease risk.  Explained role of statins in prevention of CV disease complications and actual risk for adverse effects with statin treatment.  Counseled patient on genetic component placing them at risk for hyperlipidemia. -Recommended to continue current medication  Diabetes (A1c goal <8%) Lab Results  Component Value Date   HGBA1C 7.7 (H) 02/16/2021   HGBA1C 8.3 (H) 07/21/2020   HGBA1C 7.9 (H) 12/24/2019   Lab Results  Component Value Date   MICROALBUR 30 07/21/2020   LDLCALC 97 02/16/2021   CREATININE 0.89 02/16/2021   Lab Results  Component Value Date   NA 145 (H) 02/16/2021   K 4.8 02/16/2021   CREATININE 0.89 02/16/2021   GFRNONAA 50 (L) 12/24/2019   GFRAA 57 (L) 12/24/2019   GLUCOSE 121 (H) 02/16/2021   Lab Results  Component Value Date   WBC 8.7 02/16/2021   HGB 14.4 02/16/2021   HCT 45.6 02/16/2021   MCV 87 02/16/2021   PLT 230 02/16/2021  -Controlled -Current medications: Liraglutide 1.3m83mD (Not from 07/21/20 says to increase to 1.8mh74mtemir 12 units QD Glimepiride 1mg 13m-Medications previously tried: N/A   -Current home glucose readings (Tests daily) fasting glucose: Doesn't write numbers post prandial glucose: only tests fasting -Denies hypoglycemic/hyperglycemic symptoms -Current meal patterns:  breakfast: Didn't specify  lunch: Didn't specify dinner:Didn't specify snacks: Didn't specify drinks: Didn't specify -Current exercise: Walks dog -Educated on A1c and blood sugar goals;  *Extensive time was spent counseling patient on the pathophysiology and risk for complications with uncontrolled Diabetes.  Used teach back method for counseling on ADA diabetic diet.  Time spent on explaining role of carbohydrates versus sugars impacting blood glucose levels and meal planning. Current exercise consists of walking 10 minutes three times a week, plan is to increase to 20 minutes 5 days a week and then after 2-3 weeks increase to 30 minute 5 days a week.  -Counseled to check feet daily and get yearly eye exams Sept 2022:  -Will ask PCP which Liraglutide dose he wishes patient to be on -Will have Concierge check on patient's sugars monthly until we get her below 7 -Will have patient try to schedule f/u with PCP, has no future visits Feb 2023: Will ask PCP about Liraglutide again  Depression/Anxiety Depression screen PHQ [facility]10/01/2021 11/12/2020 06/03/2020  Decreased Interest 0 0 3  Down, Depressed, Hopeless 0 0 0  PHQ - 2 Score 0 0 3  Altered sleeping - 0 3  Tired, decreased energy - 0 2  Change in appetite - 0 3  Feeling bad or failure about yourself  - 0 0  Trouble concentrating - 0 0  Moving slowly or fidgety/restless - 0 0  Suicidal thoughts - 0 0  PHQ-9 Score - 0 11  Difficult doing work/chores - Not difficult at all Somewhat difficult  GAD 7 : Generalized Anxiety Score 11/12/2020  Nervous, Anxious, on Edge 0  Control/stop worrying 0  Worry too much - different things 0  Trouble relaxing 0  Restless 0  Easily annoyed or irritable 0  Afraid - awful might happen 0  Total GAD 7  Score 0  -Controlled -Current treatment: None -Medications previously tried/failed: Mirtazapine Michela Pitcher PCP took off) -Educated on Benefits of medication for symptom control Sept 2022: Unsure when mirtazapine was started and patient is non-compliant, will ask PCP if it's needed (Considering her age and risk factors) Recommend continue on non-therapy  Meds: -Patient gets meds from [facility] and is very non-compliant. There isn't a single med she is 100% compliant on Feb 2023: Verbal consent obtained for [facility] enhanced pharmacy services (medication synchronization, adherence packaging, delivery coordination). A medication sync plan was created to allow patient to get all medications delivered once every 30 to 90 days per patient preference. Patient understands they have freedom to choose pharmacy and clinical pharmacist will coordinate care between all prescribers and [facility].    Patient Goals/Self-Care Activities Patient will:  - take medications as prescribed  Follow Up Plan: The patient has been provided with contact information for the care management team and has been advised to call with any health related questions or concerns.   F/U August 2023  [place] Constable, Sherian Rein.D. - [phone]        Medication Assistance: Utilizing [facility] (Would like to start getting meds packed in December 2022.  Compliance/Adherence/Medication fill history: Care Gaps: Last annual wellness visit? 40/98/11 If applicable: Last eye exam / retinopathy screening? 07/21/20 Last diabetic foot exam? 07/21/20  Star-Rating Drugs: -Rosuvastatin -Metoprolol -Lisinopril -Glimepiride (^non-compliant on all the above)  Patient's preferred pharmacy is:  [facility] [place], [place] - [address] AT [address] [place] [address] Phone: [phone] Fax: [phone]  [facility] [place], [place] [address] [place] [address] Phone: [phone] Fax: [phone]   Uses pill box? Yes Pt endorses 100% compliance (Fill Hx shows she's non-compliant  We discussed: Benefits of medication synchronization, packaging and delivery as well as enhanced pharmacist oversight with [facility]. Patient decided to: Utilize [facility] for medication synchronization, packaging and delivery  Care Plan and Follow Up Patient Decision:  Patient agrees to Care Plan and Follow-up.  Plan: The patient has been provided with contact information for the care management team and has been advised to call with any health related questions or concerns.   Care Plan : [place]  Updates made by Lane Hacker, RPH since 04/22/2021 12:00 AM     Problem: DM, HTN, Lipids, Osteopenia   Priority: High  Onset Date: 11/21/2020     Long-Range Goal: Disease State Management   Start Date: 11/21/2020  Expected End Date: 11/21/2021  Recent Progress: On track  Priority: High  Note:   Current Barriers:  Unable to achieve control of DM  Does not adhere to prescribed medication regimen Does not maintain contact with provider office Does not contact provider office for questions/concerns  Pharmacist Clinical Goal(s):  Patient will achieve adherence to monitoring guidelines and medication adherence to achieve therapeutic efficacy contact provider office for questions/concerns as evidenced notation of same in electronic health record through collaboration with PharmD and provider.   Interventions: 1:1 collaboration with Lillard Anes, MD regarding development and update of comprehensive  plan of care as evidenced by provider attestation and co-signature Inter-disciplinary care team collaboration (see longitudinal plan of care) Comprehensive medication review performed; medication list updated in electronic medical record  Hypertension (BP goal <140/90) BP Readings  from Last 3 Encounters:  02/16/21 135/80  12/23/20 130/72  07/21/20 120/84  -Controlled -Current treatment: Metoprolol 79m BID Appropriate, Effective, Safe, Accessible Lisinopril 236mQD Appropriate, Effective, Safe, Accessible -Medications previously tried: N/A  -Current home readings: Not testing -Current dietary habits: "Tries to eat healthy" -Current exercise habits: Walking her dog -Denies hypotensive/hypertensive symptoms -Educated on  Extensive time spent on counseling patient on blood pressure goal and impact of each antihypertensive medication on their blood pressure and risk reduction for CV disease.  Used analogies to explain the need for multiple antihypertensive medications to achieve BP goals and that it is often a silent disease with no symptoms.  -Counseled to monitor BP at home, document, and provide log at future appointments -Recommended to continue current medication  Hyperlipidemia: (LDL goal < 100) Lab Results  Component Value Date   CHOL 178 02/16/2021   CHOL 160 07/21/2020   CHOL 167 12/24/2019   Lab Results  Component Value Date   HDL 61 02/16/2021   HDL 70 07/21/2020   HDL 60 12/24/2019   Lab Results  Component Value Date   LDLCALC 97 02/16/2021   LDLCALC 68 07/21/2020   LDLCALC 82 12/24/2019   Lab Results  Component Value Date   TRIG 114 02/16/2021   TRIG 127 07/21/2020   TRIG 144 12/24/2019   Lab Results  Component Value Date   CHOLHDL 2.9 02/16/2021   CHOLHDL 2.3 07/21/2020   CHOLHDL 2.8 12/24/2019  No results found for: LDLDIRECT -Controlled -Current treatment: Rosuvastatin 4061mppropriate, Effective, Safe, Accessible Ezetimibe 47m96mpropriate, Effective, Safe, Accessible -Medications previously tried: N/A  -Current dietary habits: "Tries to eat healthy" -Current exercise habits: Walking her dog -Educated on: Over 15 minutes spent counseling patient on role of hyperlipidemia on heart disease and the impact of each lipid subclass  with emphasis on LDL and heart disease risk.  Explained role of statins in prevention of CV disease complications and actual risk for adverse effects with statin treatment.  Counseled patient on genetic component placing them at risk for hyperlipidemia. -Recommended to continue current medication  Diabetes (A1c goal <8%) Lab Results  Component Value Date   HGBA1C 7.7 (H) 02/16/2021   HGBA1C 8.3 (H) 07/21/2020   HGBA1C 7.9 (H) 12/24/2019   Lab Results  Component Value Date   MICROALBUR 30 07/21/2020   LDLCALC 97 02/16/2021   CREATININE 0.89 02/16/2021   Lab Results  Component Value Date   NA 145 (H) 02/16/2021   K 4.8 02/16/2021   CREATININE 0.89 02/16/2021   GFRNONAA 50 (L) 12/24/2019   GFRAA 57 (L) 12/24/2019   GLUCOSE 121 (H) 02/16/2021   Lab Results  Component Value Date   WBC 8.7 02/16/2021   HGB 14.4 02/16/2021   HCT 45.6 02/16/2021   MCV 87 02/16/2021   PLT 230 02/16/2021  -Controlled -Current medications: Liraglutide 1.2mg 2m(Not from 07/21/20 says to increase to 1.8mh D26mmir 12 units QD Glimepiride 1mg AM20medications previously tried: N/A  -Current home glucose readings (Tests daily) fasting glucose: Doesn't write numbers post prandial glucose: only tests fasting -Denies hypoglycemic/hyperglycemic symptoms -Current meal patterns:  breakfast: Didn't specify  lunch: Didn't specify dinner:Didn't specify snacks: Didn't specify drinks: Didn't specify -Current exercise: Walks dog -Educated on A1c and blood sugar goals;  *  Extensive time was spent counseling patient on the pathophysiology and risk for complications with uncontrolled Diabetes.  Used teach back method for counseling on ADA diabetic diet.  Time spent on explaining role of carbohydrates versus sugars impacting blood glucose levels and meal planning. Current exercise consists of walking 10 minutes three times a week, plan is to increase to 20 minutes 5 days a week and then after 2-3 weeks increase to 30  minute 5 days a week.  -Counseled to check feet daily and get yearly eye exams Sept 2022:  -Will ask PCP which Liraglutide dose he wishes patient to be on -Will have Concierge check on patient's sugars monthly until we get her below 7 -Will have patient try to schedule f/u with PCP, has no future visits Feb 2023: Will ask PCP about Liraglutide again  Depression/Anxiety Depression screen [facility] 2/9 12/23/2020 11/12/2020 06/03/2020  Decreased Interest 0 0 3  Down, Depressed, Hopeless 0 0 0  PHQ - 2 Score 0 0 3  Altered sleeping - 0 3  Tired, decreased energy - 0 2  Change in appetite - 0 3  Feeling bad or failure about yourself  - 0 0  Trouble concentrating - 0 0  Moving slowly or fidgety/restless - 0 0  Suicidal thoughts - 0 0  PHQ-9 Score - 0 11  Difficult doing work/chores - Not difficult at all Somewhat difficult   GAD 7 : Generalized Anxiety Score 11/12/2020  Nervous, Anxious, on Edge 0  Control/stop worrying 0  Worry too much - different things 0  Trouble relaxing 0  Restless 0  Easily annoyed or irritable 0  Afraid - awful might happen 0  Total GAD 7 Score 0  -Controlled -Current treatment: None -Medications previously tried/failed: Mirtazapine Michela Pitcher PCP took off) -Educated on Benefits of medication for symptom control Sept 2022: Unsure when mirtazapine was started and patient is non-compliant, will ask PCP if it's needed (Considering her age and risk factors) Recommend continue on non-therapy  Meds: -Patient gets meds from [facility] and is very non-compliant. There isn't a single med she is 100% compliant on Feb 2023: Verbal consent obtained for [facility] enhanced pharmacy services (medication synchronization, adherence packaging, delivery coordination). A medication sync plan was created to allow patient to get all medications delivered once every 30 to 90 days per patient preference. Patient understands they have freedom to choose pharmacy and clinical pharmacist  will coordinate care between all prescribers and [facility].    Patient Goals/Self-Care Activities Patient will:  - take medications as prescribed  Follow Up Plan: The patient has been provided with contact information for the care management team and has been advised to call with any health related questions or concerns.   F/U August 2023  [place] Constable, Sherian Rein.D. - [phone]

## 2021-04-22 NOTE — Patient Instructions (Signed)
Visit Information   Goals Addressed   None    Patient Care Plan: CCM Pharmacy Care Plan     Problem Identified: DM, HTN, Lipids, Osteopenia   Priority: High  Onset Date: 11/21/2020     Long-Range Goal: Disease State Management   Start Date: 11/21/2020  Expected End Date: 11/21/2021  Recent Progress: On track  Priority: High  Note:   Current Barriers:  Unable to achieve control of DM  Does not adhere to prescribed medication regimen Does not maintain contact with provider office Does not contact provider office for questions/concerns  Pharmacist Clinical Goal(s):  Patient will achieve adherence to monitoring guidelines and medication adherence to achieve therapeutic efficacy contact provider office for questions/concerns as evidenced notation of same in electronic health record through collaboration with PharmD and provider.   Interventions: 1:1 collaboration with Lillard Anes, MD regarding development and update of comprehensive plan of care as evidenced by provider attestation and co-signature Inter-disciplinary care team collaboration (see longitudinal plan of care) Comprehensive medication review performed; medication list updated in electronic medical record  Hypertension (BP goal <140/90) BP Readings from Last 3 Encounters:  02/16/21 135/80  12/23/20 130/72  07/21/20 120/84  -Controlled -Current treatment: Metoprolol 25mg  BID Appropriate, Effective, Safe, Accessible Lisinopril 20mg  QD Appropriate, Effective, Safe, Accessible -Medications previously tried: N/A  -Current home readings: Not testing -Current dietary habits: "Tries to eat healthy" -Current exercise habits: Walking her dog -Denies hypotensive/hypertensive symptoms -Educated on  Extensive time spent on counseling patient on blood pressure goal and impact of each antihypertensive medication on their blood pressure and risk reduction for CV disease.  Used analogies to explain the need for multiple  antihypertensive medications to achieve BP goals and that it is often a silent disease with no symptoms.  -Counseled to monitor BP at home, document, and provide log at future appointments -Recommended to continue current medication  Hyperlipidemia: (LDL goal < 100) Lab Results  Component Value Date   CHOL 178 02/16/2021   CHOL 160 07/21/2020   CHOL 167 12/24/2019   Lab Results  Component Value Date   HDL 61 02/16/2021   HDL 70 07/21/2020   HDL 60 12/24/2019   Lab Results  Component Value Date   LDLCALC 97 02/16/2021   LDLCALC 68 07/21/2020   LDLCALC 82 12/24/2019   Lab Results  Component Value Date   TRIG 114 02/16/2021   TRIG 127 07/21/2020   TRIG 144 12/24/2019   Lab Results  Component Value Date   CHOLHDL 2.9 02/16/2021   CHOLHDL 2.3 07/21/2020   CHOLHDL 2.8 12/24/2019  No results found for: LDLDIRECT -Controlled -Current treatment: Rosuvastatin 40mg  Appropriate, Effective, Safe, Accessible Ezetimibe 10mg  Appropriate, Effective, Safe, Accessible -Medications previously tried: N/A  -Current dietary habits: "Tries to eat healthy" -Current exercise habits: Walking her dog -Educated on: Over 15 minutes spent counseling patient on role of hyperlipidemia on heart disease and the impact of each lipid subclass with emphasis on LDL and heart disease risk.  Explained role of statins in prevention of CV disease complications and actual risk for adverse effects with statin treatment.  Counseled patient on genetic component placing them at risk for hyperlipidemia. -Recommended to continue current medication  Diabetes (A1c goal <8%) Lab Results  Component Value Date   HGBA1C 7.7 (H) 02/16/2021   HGBA1C 8.3 (H) 07/21/2020   HGBA1C 7.9 (H) 12/24/2019   Lab Results  Component Value Date   MICROALBUR 30 07/21/2020   LDLCALC 97 02/16/2021   CREATININE 0.89 02/16/2021  Lab Results  Component Value Date   NA 145 (H) 02/16/2021   K 4.8 02/16/2021   CREATININE 0.89  02/16/2021   GFRNONAA 50 (L) 12/24/2019   GFRAA 57 (L) 12/24/2019   GLUCOSE 121 (H) 02/16/2021   Lab Results  Component Value Date   WBC 8.7 02/16/2021   HGB 14.4 02/16/2021   HCT 45.6 02/16/2021   MCV 87 02/16/2021   PLT 230 02/16/2021  -Controlled -Current medications: Liraglutide 1.2mg  QD (Not from 07/21/20 says to increase to 1.72mh Detemir 12 units QD Glimepiride 1mg  AM -Medications previously tried: N/A  -Current home glucose readings (Tests daily) fasting glucose: Doesn't write numbers post prandial glucose: only tests fasting -Denies hypoglycemic/hyperglycemic symptoms -Current meal patterns:  breakfast: Didn't specify  lunch: Didn't specify dinner:Didn't specify snacks: Didn't specify drinks: Didn't specify -Current exercise: Walks dog -Educated on A1c and blood sugar goals;  *Extensive time was spent counseling patient on the pathophysiology and risk for complications with uncontrolled Diabetes.  Used teach back method for counseling on ADA diabetic diet.  Time spent on explaining role of carbohydrates versus sugars impacting blood glucose levels and meal planning. Current exercise consists of walking 10 minutes three times a week, plan is to increase to 20 minutes 5 days a week and then after 2-3 weeks increase to 30 minute 5 days a week.  -Counseled to check feet daily and get yearly eye exams Sept 2022:  -Will ask PCP which Liraglutide dose he wishes patient to be on -Will have Concierge check on patient's sugars monthly until we get her below 7 -Will have patient try to schedule f/u with PCP, has no future visits Feb 2023: Will ask PCP about Liraglutide again  Depression/Anxiety Depression screen [facility] 2/9 12/23/2020 11/12/2020 06/03/2020  Decreased Interest 0 0 3  Down, Depressed, Hopeless 0 0 0  PHQ - 2 Score 0 0 3  Altered sleeping - 0 3  Tired, decreased energy - 0 2  Change in appetite - 0 3  Feeling bad or failure about yourself  - 0 0  Trouble  concentrating - 0 0  Moving slowly or fidgety/restless - 0 0  Suicidal thoughts - 0 0  PHQ-9 Score - 0 11  Difficult doing work/chores - Not difficult at all Somewhat difficult   GAD 7 : Generalized Anxiety Score 11/12/2020  Nervous, Anxious, on Edge 0  Control/stop worrying 0  Worry too much - different things 0  Trouble relaxing 0  Restless 0  Easily annoyed or irritable 0  Afraid - awful might happen 0  Total GAD 7 Score 0  -Controlled -Current treatment: None -Medications previously tried/failed: Mirtazapine Michela Pitcher PCP took off) -Educated on Benefits of medication for symptom control Sept 2022: Unsure when mirtazapine was started and patient is non-compliant, will ask PCP if it's needed (Considering her age and risk factors) Recommend continue on non-therapy  Meds: -Patient gets meds from [facility] and is very non-compliant. There isn't a single med she is 100% compliant on Feb 2023: Verbal consent obtained for [facility] enhanced pharmacy services (medication synchronization, adherence packaging, delivery coordination). A medication sync plan was created to allow patient to get all medications delivered once every 30 to 90 days per patient preference. Patient understands they have freedom to choose pharmacy and clinical pharmacist will coordinate care between all prescribers and [facility].    Patient Goals/Self-Care Activities Patient will:  - take medications as prescribed  Follow Up Plan: The patient has been provided with contact information for  the care management team and has been advised to call with any health related questions or concerns.   F/U August 2023  [place] Constable, Sherian Rein.D. - [phone]       Ms. Hibner was given information about Chronic Care Management services today including:  CCM service includes personalized support from designated clinical staff supervised by her physician, including individualized plan of care and coordination  with other care providers 24/7 contact phone numbers for assistance for urgent and routine care needs. Standard insurance, coinsurance, copays and deductibles apply for chronic care management only during months in which we provide at least 20 minutes of these services. Most insurances cover these services at 100%, however patients may be responsible for any copay, coinsurance and/or deductible if applicable. This service may help you avoid the need for more expensive face-to-face services. Only one practitioner may furnish and bill the service in a calendar month. The patient may stop CCM services at any time (effective at the end of the month) by phone call to the office staff.  Patient agreed to services and verbal consent obtained.   The patient verbalized understanding of instructions, educational materials, and care plan provided today and declined offer to receive copy of patient instructions, educational materials, and care plan.  The pharmacy team will reach out to the patient again over the next 60 days.   Lane Hacker, [place]

## 2021-05-07 DIAGNOSIS — E1169 Type 2 diabetes mellitus with other specified complication: Secondary | ICD-10-CM | POA: Diagnosis not present

## 2021-05-12 DIAGNOSIS — E1169 Type 2 diabetes mellitus with other specified complication: Secondary | ICD-10-CM | POA: Diagnosis not present

## 2021-05-12 DIAGNOSIS — F339 Major depressive disorder, recurrent, unspecified: Secondary | ICD-10-CM

## 2021-05-12 DIAGNOSIS — E782 Mixed hyperlipidemia: Secondary | ICD-10-CM | POA: Diagnosis not present

## 2021-05-12 DIAGNOSIS — Z794 Long term (current) use of insulin: Secondary | ICD-10-CM

## 2021-05-17 ENCOUNTER — Other Ambulatory Visit: Payer: Self-pay | Admitting: Legal Medicine

## 2021-05-22 ENCOUNTER — Other Ambulatory Visit: Payer: Self-pay | Admitting: Legal Medicine

## 2021-06-01 ENCOUNTER — Other Ambulatory Visit: Payer: Self-pay | Admitting: Legal Medicine

## 2021-06-01 DIAGNOSIS — E1169 Type 2 diabetes mellitus with other specified complication: Secondary | ICD-10-CM

## 2021-06-04 ENCOUNTER — Telehealth: Payer: Self-pay

## 2021-06-04 NOTE — Progress Notes (Signed)
? ? ?Chronic Care Management ?Pharmacy Assistant  ? ?Name: Rebecca Hodge  MRN: [id number] DOB: 1940/06/01 ? ? ?Reason for Encounter: Medication Coordination for [facility]  ?  ?Recent office visits:  ?None ? ?Recent consult visits:  ?None ? ?Hospital visits:  ?None ? ?Medications: ?Outpatient Encounter Medications as of 06/04/2021  ?Medication Sig  ? ACCU-CHEK GUIDE test strip 1 each by Other route daily. Check blood sugar fasting in the morning daily.  ? alendronate (FOSAMAX) 70 MG tablet TAKE 1 TABLET BY MOUTH WEEKLY ON THE SAME DAY EACH WEEK, WITH A FULL GLASS OF WATER AT LEAST 30 MINUTES BEFORE FIRST FOOD OR BEVERAGE OF THE DAY. REMAIN UPRIGHT FOR AT LEAST 30 MINUTES AFTER TAKING  ? BD PEN NEEDLE NANO U/F 32G X 4 MM MISC See admin instructions.  ? Blood Glucose Monitoring Suppl (ACCU-CHEK GUIDE) w/Device KIT 1 each by Other route daily.  ? estradiol (ESTRACE) 0.1 MG/GM vaginal cream INSERT 1 APPLICATORFUL VAGINALLY 3 TIMES A WEEK  ? ezetimibe (ZETIA) 10 MG tablet Take 1 tablet (10 mg total) by mouth daily.  ? gabapentin (NEURONTIN) 300 MG capsule Take 1 capsule (300 mg total) by mouth 3 (three) times daily.  ? glimepiride (AMARYL) 1 MG tablet TAKE 1 TABLET BY MOUTH DAILY BEFORE BREAKFAST  ? insulin detemir (LEVEMIR FLEXTOUCH) 100 UNIT/ML FlexPen Inject 12 Units into the skin daily.  ? Lancets (ONETOUCH ULTRASOFT) lancets 1 each by Other route daily. Use as instructed  ? lisinopril (ZESTRIL) 20 MG tablet TAKE 1 TABLET(20 MG) BY MOUTH DAILY  ? metoprolol tartrate (LOPRESSOR) 25 MG tablet Take 1 tablet (25 mg total) by mouth 2 (two) times daily.  ? mirtazapine (REMERON) 15 MG tablet Take 1 tablet (15 mg total) by mouth at bedtime.  ? nitrofurantoin (MACRODANTIN) 50 MG capsule TAKE 1 CAPSULE(50 MG) BY MOUTH AT BEDTIME  ? pantoprazole (PROTONIX) 40 MG tablet Take 1 tablet (40 mg total) by mouth daily.  ? rosuvastatin (CRESTOR) 40 MG tablet Take 1 tablet (40 mg total) by mouth at bedtime.  ? VICTOZA 18 MG/3ML SOPN  INJECT 0.2 MILLILITERS (1.2 MG TOTAL) UNDER THE SKIN DAILY.  ? ?No facility-administered encounter medications on file as of 06/04/2021.  ? ? ?Reviewed chart for medication changes ahead of medication coordination call. ? ?No OVs, Consults, or hospital visits since last care coordination call/Pharmacist visit.  ? ?No medication changes indicated OR if recent visit, treatment plan here. ? ?BP Readings from Last 3 Encounters:  ?02/16/21 135/80  ?12/23/20 130/72  ?07/21/20 120/84  ?  ?Lab Results  ?Component Value Date  ? HGBA1C 7.7 (H) 02/16/2021  ?  ? ?Patient obtains medications through [facility]  30 Days  ? ?Patient is due for her first adherence delivery on: 06/16/21. ?Called patient and reviewed medications and coordinated delivery. ? ?This delivery to include: ?Alendronate 26m 1 at BB ?Ezetimibe 135m1 at EM ?Glimepiride 104m52m at B ?Levemir Flexpen 100units Inject 12 units daily  ?Lisinopril 104m60mat EM ?Metoprolol 25mg104mt B and 1 at EM ?Nitrofurantoin 50mg 47m B ?Pantoprazole 40mg 123mB ?Rosuvastatin 40mg 1 46mT ?Victoza 18mg/3ml45mect 1.2mg daily68mas [facility] to [facility] have Rebecca Hodge get this transferred to our pharmacy. Was sent in on 3/20 but not filled yet.  ?Mirtazapine 15mg 1 at 75m ?Patient declined the following medications  ?Onetouch Lancets/Test Stips- Pt has plenty of supply right now ?Estradiol Vaginal Cream- Only uses prn, does not need ? ?  Patient needs refills  ?None ? ?Confirmed delivery date of 06/26/21, advised patient that pharmacy will contact them the morning of delivery. ? ?Elray Mcgregor, CMA ?Clinical Pharmacist Assistant  ?[phone]  ?

## 2021-06-05 NOTE — Telephone Encounter (Signed)
Compliant on meds 

## 2021-06-06 DIAGNOSIS — E1169 Type 2 diabetes mellitus with other specified complication: Secondary | ICD-10-CM | POA: Diagnosis not present

## 2021-06-12 ENCOUNTER — Other Ambulatory Visit: Payer: Self-pay | Admitting: Legal Medicine

## 2021-06-12 DIAGNOSIS — N390 Urinary tract infection, site not specified: Secondary | ICD-10-CM

## 2021-06-12 MED ORDER — NITROFURANTOIN MACROCRYSTAL 50 MG PO CAPS: ORAL_CAPSULE | ORAL | 2 refills | Status: DC

## 2021-06-18 ENCOUNTER — Telehealth: Payer: Self-pay

## 2021-06-18 NOTE — Telephone Encounter (Signed)
Patient's meds were $32 with [facility]. Coordinated with Lauren who got things fixed (Accidentally run via cash price). Patient would like to continue with [facility] now that prices are equal and the same ?

## 2021-06-21 ENCOUNTER — Encounter: Payer: Self-pay | Admitting: Legal Medicine

## 2021-06-21 NOTE — Progress Notes (Addendum)
Subjective:  Patient ID: [name], female    DOB: 1940-04-02  Age: 81 y.o. MRN: [id number]  Chief Complaint  Patient presents with   Diabetes   Hyperlipidemia    HPI: chronic visit  Patient present with type 2 diabetes.  Specifically, this is type 2, insulin he has requiring diabetes, complicated by hypertension, hyperlipidemia.  Compliance with treatment has been good; patient take medicines as directed, maintains diet and exercise regimen, follows up as directed, and is keeping glucose diary.  Date of  diagnosis 2010.  Depression screen has been performed.Tobacco screen negative urrent medicines for diabetes victoza, glimipiride.  Patient is on noe for renal protection and crestor for cholesterol control.  Patient performs foot exams daily and last ophthalmologic exam was to get this month.   Patient presents with hyperlipidemia.  Compliance with treatment has been good; patient takes medicines as directed, maintains low cholesterol diet, follows up as directed, and maintains exercise regimen.  Patient is using metoprolol without problems.    Current Outpatient Medications on File Prior to Visit  Medication Sig Dispense Refill   ACCU-CHEK GUIDE test strip 1 each by Other route daily. Check blood sugar fasting in the morning daily. 100 each 2   alendronate (FOSAMAX) 70 MG tablet TAKE 1 TABLET BY MOUTH WEEKLY ON THE SAME DAY EACH WEEK, WITH A FULL GLASS OF WATER AT LEAST 30 MINUTES BEFORE FIRST FOOD OR BEVERAGE OF THE DAY. REMAIN UPRIGHT FOR AT LEAST 30 MINUTES AFTER TAKING 12 tablet 1   BD PEN NEEDLE NANO U/F 32G X 4 MM MISC See admin instructions.     Blood Glucose Monitoring Suppl (ACCU-CHEK GUIDE) w/Device KIT 1 each by Other route daily. 1 kit 0   estradiol (ESTRACE) 0.1 MG/GM vaginal cream INSERT 1 APPLICATORFUL VAGINALLY 3 TIMES A WEEK 42.5 g 3   ezetimibe (ZETIA) 10 MG tablet Take 1 tablet (10 mg total) by mouth daily. 90 tablet 2   gabapentin (NEURONTIN) 300 MG capsule Take  1 capsule (300 mg total) by mouth 3 (three) times daily. 270 capsule 2   glimepiride (AMARYL) 1 MG tablet TAKE 1 TABLET BY MOUTH DAILY BEFORE BREAKFAST 30 tablet 2   insulin detemir (LEVEMIR FLEXTOUCH) 100 UNIT/ML FlexPen Inject 12 Units into the skin daily. 15 mL 3   Lancets (ONETOUCH ULTRASOFT) lancets 1 each by Other route daily. Use as instructed 100 each 2   metoprolol tartrate (LOPRESSOR) 25 MG tablet Take 1 tablet (25 mg total) by mouth 2 (two) times daily. 180 tablet 2   nitrofurantoin (MACRODANTIN) 50 MG capsule TAKE 1 CAPSULE(50 MG) BY MOUTH AT BEDTIME 90 capsule 2   pantoprazole (PROTONIX) 40 MG tablet Take 1 tablet (40 mg total) by mouth daily. 90 tablet 2   rosuvastatin (CRESTOR) 40 MG tablet Take 1 tablet (40 mg total) by mouth at bedtime. 90 tablet 2   VICTOZA 18 MG/3ML SOPN INJECT 0.2 MILLILITERS (1.2 MG TOTAL) UNDER THE SKIN DAILY. 6 mL 6   No current facility-administered medications on file prior to visit.   Past Medical History:  Diagnosis Date   Age-related osteoporosis without current pathological fracture 06/18/2019   Allergic rhinitis    BMI 27.0-27.9,adult 08/17/2019   Diabetes ([place])    Dysphasia following cerebral infarction 06/18/2019   Hyperlipidemia    Hypertension    Mixed hyperlipidemia 06/18/2019   Mixed incontinence 06/18/2019   Sequelae of poliomyelitis 06/18/2019   Type 2 diabetes mellitus with other specified complication ([place]) 10/15/9935   Past  Surgical History:  Procedure Laterality Date   ABDOMINAL HYSTERECTOMY     CATARACT EXTRACTION Right    CHOLECYSTECTOMY     ROTATOR CUFF REPAIR Bilateral     History reviewed. No pertinent family history. Social History   Socioeconomic History   Marital status: Widowed    Spouse name: Not on file   Number of children: Not on file   Years of education: Not on file   Highest education level: Not on file  Occupational History   Occupation: Disabled  Tobacco Use   Smoking status: Former    Packs/day: 1.00     Years: 5.00    Pack years: 5.00    Types: Cigarettes    Quit date: 03/16/1987    Years since quitting: 34.2   Smokeless tobacco: Never  Substance and Sexual Activity   Alcohol use: No   Drug use: No   Sexual activity: Not Currently  Other Topics Concern   Not on file  Social History Narrative   Not on file   Social Determinants of Health   Financial Resource Strain: Low Risk    Difficulty of Paying Living Expenses: Not hard at all  Food Insecurity: Not on file  Transportation Needs: No Transportation Needs   Lack of Transportation (Medical): No   Lack of Transportation (Non-Medical): No  Physical Activity: Not on file  Stress: Not on file  Social Connections: Not on file    Review of Systems  Constitutional:  Negative for chills, fatigue and fever.  HENT:  Negative for congestion, rhinorrhea and sore throat.   Eyes:  Negative for visual disturbance.  Respiratory:  Negative for cough and shortness of breath.   Cardiovascular:  Negative for chest pain.  Gastrointestinal:  Negative for abdominal pain, constipation, diarrhea, nausea and vomiting.  Genitourinary:  Negative for dysuria and urgency.       Strong smelling urine  Musculoskeletal:  Negative for back pain and myalgias.  Neurological:  Negative for dizziness, weakness, light-headedness and headaches.  Psychiatric/Behavioral:  Negative for dysphoric mood. The patient is not nervous/anxious.     Objective:  BP 106/72   Pulse 76   Temp (!) 96.7 F (35.9 C)   Resp 16   Ht 5' (1.524 m)   Wt 140 lb (63.5 kg)   LMP  (LMP Unknown)   BMI 27.34 kg/m      06/22/2021    9:15 AM 02/16/2021   10:23 AM 12/23/2020    3:43 PM  BP/Weight  Systolic BP 892 119 417  Diastolic BP 72 80 72  Wt. (Lbs) 140 140 141  BMI 27.34 kg/m2 26.45 kg/m2 26.64 kg/m2    Physical Exam Vitals reviewed.  Constitutional:      General: She is not in acute distress.    Appearance: Normal appearance.  HENT:     Head: Normocephalic and  atraumatic.     Left Ear: Tympanic membrane normal.     Nose: Nose normal.     Mouth/Throat:     Mouth: Mucous membranes are moist.  Eyes:     Conjunctiva/sclera: Conjunctivae normal.     Pupils: Pupils are equal, round, and reactive to light.  Cardiovascular:     Rate and Rhythm: Normal rate and regular rhythm.     Pulses: Normal pulses.     Heart sounds: Normal heart sounds. No murmur heard.   No gallop.  Pulmonary:     Effort: Pulmonary effort is normal. No respiratory distress.     Breath sounds:  Normal breath sounds. No wheezing.  Abdominal:     General: Abdomen is flat. Bowel sounds are normal. There is no distension.     Palpations: Abdomen is soft.     Tenderness: There is no abdominal tenderness.  Musculoskeletal:     Cervical back: Normal range of motion.     Right lower leg: No edema.     Left lower leg: No edema.  Skin:    General: Skin is warm.     Capillary Refill: Capillary refill takes less than 2 seconds.     Comments: Several lesion on face repaired by dermatology  Neurological:     General: No focal deficit present.     Mental Status: She is alert and oriented to person, place, and time. Mental status is at baseline.     Gait: Gait normal.     Deep Tendon Reflexes: Reflexes normal.  Psychiatric:        Mood and Affect: Mood normal.        Thought Content: Thought content normal.        Judgment: Judgment normal.    Diabetic Foot Exam - Simple   Simple Foot Form Diabetic Foot exam was performed with the following findings: Yes 06/22/2021  9:43 AM  Visual Inspection No deformities, no ulcerations, no other skin breakdown bilaterally: Yes Sensation Testing Intact to touch and monofilament testing bilaterally: Yes Pulse Check Posterior Tibialis and Dorsalis pulse intact bilaterally: Yes Comments    Patient has preulcerative callus on plantar aspect foot  Lab Results  Component Value Date   WBC 8.7 02/16/2021   HGB 14.4 02/16/2021   HCT 45.6  02/16/2021   PLT 230 02/16/2021   GLUCOSE 121 (H) 02/16/2021   CHOL 178 02/16/2021   TRIG 114 02/16/2021   HDL 61 02/16/2021   LDLCALC 97 02/16/2021   ALT 10 02/16/2021   AST 18 02/16/2021   NA 145 (H) 02/16/2021   K 4.8 02/16/2021   CL 107 (H) 02/16/2021   CREATININE 0.89 02/16/2021   BUN 12 02/16/2021   CO2 25 02/16/2021   HGBA1C 7.7 (H) 02/16/2021   MICROALBUR 30 07/21/2020      06/22/2021    4:22 PM 06/22/2021    9:23 AM 12/23/2020    3:47 PM 11/12/2020    6:33 PM 06/03/2020   10:38 AM  Depression screen PHQ 2/9  Decreased Interest 0 0 0 0 3  Down, Depressed, Hopeless 0 0 0 0 0  PHQ - 2 Score 0 0 0 0 3  Altered sleeping    0 3  Tired, decreased energy    0 2  Change in appetite    0 3  Feeling bad or failure about yourself     0 0  Trouble concentrating    0 0  Moving slowly or fidgety/restless    0 0  Suicidal thoughts    0 0  PHQ-9 Score    0 11  Difficult doing work/chores    Not difficult at all Somewhat difficult      Assessment & Plan:   Problem List Items Addressed This Visit       Cardiovascular and Mediastinum   Atherosclerosis of aorta ([place]) Patient was found to have atherosclerosis of the aorta on CT scan she is on Crestor for hypercholesterolemia.     Endocrine   Type 2 diabetes mellitus with other specified complication (HCC) - Primary   Relevant Orders   Comprehensive metabolic panel   Hemoglobin A1c  Microalbumin / creatinine urine ratio An individual care plan for diabetes was established and reinforced today.  The patient's status was assessed using clinical findings on exam, labs and diagnostic testing. Patient success at meeting goals based on disease specific evidence-based guidelines and found to be good controlled. Medications were assessed and patient's understanding of the medical issues , including barriers were assessed. Recommend adherence to a diabetic diet, a graduated exercise program, HgbA1c level is checked quarterly, and  urine microalbumin performed yearly .  Annual mono-filament sensation testing performed. Lower blood pressure and control hyperlipidemia is important. Get annual eye exams and annual flu shots and smoking cessation discussed.  Self management goals were discussed.      Musculoskeletal and Integument   Age-related osteoporosis without current pathological fracture   Relevant Orders   VITAMIN D 25 Hydroxy (Vit-D Deficiency, Fractures) Patient has osteoporosis remains on vitamin D and calcium present time.  She is also on alendronate once a week.     Genitourinary   Postmenopausal atrophic vaginitis Patient has atrophic vaginitis which causes some dysuria her urine is normal at the present time and presently she is  on  postmenopausal hormones topically     Other   Dysphasia following cerebral infarction Patient has some dysphagia following her cerebral infarction several years ago but she can speak fairly well.    Mixed incontinence   Relevant Orders   POCT urinalysis dipstick (Completed)   Urine Culture Patient has mixed incontinence presently not using any treatment per her request.   Mixed hyperlipidemia   Relevant Orders   Lipid panel AN INDIVIDUAL CARE PLAN for hyperlipidemia/ cholesterol was established and reinforced today.  The patient's status was assessed using clinical findings on exam, lab and other diagnostic tests. The patient's disease status was assessed based on evidence-based guidelines and found to be fair controlled. MEDICATIONS were reviewed. SELF MANAGEMENT GOALS have been discussed and patient's success at attaining the goal of low cholesterol was assessed. RECOMMENDATION given include regular exercise 3 days a week and low cholesterol/low fat diet. CLINICAL SUMMARY including written plan to identify barriers unique to the patient due to social or economic  reasons was discussed.     Sleep disorder Patient does have trouble getting to sleep at times but presently  is not using any medicines.   Depression, recurrent ([place]) Patient has resolution of her depression she did not tolerate antidepressants well and is actually feeling better without them so we will continue off all antidepressants.  .  Patient was seen for 35 minutes we discussed her overall medical problems and as well as her osteoporosis she does not want any bisphosphonate at the present time since she tolerated poorly.  Urine microalbumin was drawn since she is hypertensive.  Orders Placed This Encounter  Procedures   Urine Culture   Comprehensive metabolic panel   Hemoglobin A1c   Lipid panel   Microalbumin / creatinine urine ratio   VITAMIN D 25 Hydroxy (Vit-D Deficiency, Fractures)   POCT urinalysis dipstick     Follow-up: Return in about 4 months (around 10/22/2021) for fasting.  An After Visit Summary was printed and given to the patient.  Reinaldo Meeker, MD [facility] [phone]

## 2021-06-22 ENCOUNTER — Encounter: Payer: Self-pay | Admitting: Legal Medicine

## 2021-06-22 ENCOUNTER — Ambulatory Visit (INDEPENDENT_AMBULATORY_CARE_PROVIDER_SITE_OTHER): Payer: Medicare HMO | Admitting: Legal Medicine

## 2021-06-22 VITALS — BP 106/72 | HR 76 | Temp 96.7°F | Resp 16 | Ht 60.0 in | Wt 140.0 lb

## 2021-06-22 DIAGNOSIS — I700 Atherosclerosis of aorta: Secondary | ICD-10-CM | POA: Diagnosis not present

## 2021-06-22 DIAGNOSIS — G479 Sleep disorder, unspecified: Secondary | ICD-10-CM

## 2021-06-22 DIAGNOSIS — N3946 Mixed incontinence: Secondary | ICD-10-CM | POA: Diagnosis not present

## 2021-06-22 DIAGNOSIS — E1169 Type 2 diabetes mellitus with other specified complication: Secondary | ICD-10-CM | POA: Diagnosis not present

## 2021-06-22 DIAGNOSIS — F339 Major depressive disorder, recurrent, unspecified: Secondary | ICD-10-CM | POA: Diagnosis not present

## 2021-06-22 DIAGNOSIS — M810 Age-related osteoporosis without current pathological fracture: Secondary | ICD-10-CM | POA: Diagnosis not present

## 2021-06-22 DIAGNOSIS — N952 Postmenopausal atrophic vaginitis: Secondary | ICD-10-CM

## 2021-06-22 DIAGNOSIS — E782 Mixed hyperlipidemia: Secondary | ICD-10-CM | POA: Diagnosis not present

## 2021-06-22 DIAGNOSIS — Z794 Long term (current) use of insulin: Secondary | ICD-10-CM | POA: Diagnosis not present

## 2021-06-22 DIAGNOSIS — I69321 Dysphasia following cerebral infarction: Secondary | ICD-10-CM | POA: Diagnosis not present

## 2021-06-22 LAB — POCT URINALYSIS DIPSTICK
Bilirubin, UA: NEGATIVE
Glucose, UA: NEGATIVE
Ketones, UA: NEGATIVE
Nitrite, UA: NEGATIVE
Protein, UA: POSITIVE — AB
Spec Grav, UA: 1.025 (ref 1.010–1.025)
Urobilinogen, UA: 0.2 E.U./dL
pH, UA: 5.5 (ref 5.0–8.0)

## 2021-06-22 NOTE — Progress Notes (Signed)
Reviewed, culture ?lp

## 2021-06-23 LAB — URINE CULTURE: Organism ID, Bacteria: NO GROWTH

## 2021-06-24 LAB — COMPREHENSIVE METABOLIC PANEL
ALT: 11 IU/L (ref 0–32)
AST: 18 IU/L (ref 0–40)
Albumin/Globulin Ratio: 2.1 (ref 1.2–2.2)
Albumin: 4.4 g/dL (ref 3.6–4.6)
Alkaline Phosphatase: 62 IU/L (ref 44–121)
BUN/Creatinine Ratio: 14 (ref 12–28)
BUN: 13 mg/dL (ref 8–27)
Bilirubin Total: 0.8 mg/dL (ref 0.0–1.2)
CO2: 25 mmol/L (ref 20–29)
Calcium: 9.6 mg/dL (ref 8.7–10.3)
Chloride: 103 mmol/L (ref 96–106)
Creatinine, Ser: 0.96 mg/dL (ref 0.57–1.00)
Globulin, Total: 2.1 g/dL (ref 1.5–4.5)
Glucose: 109 mg/dL — ABNORMAL HIGH (ref 70–99)
Potassium: 4.5 mmol/L (ref 3.5–5.2)
Sodium: 142 mmol/L (ref 134–144)
Total Protein: 6.5 g/dL (ref 6.0–8.5)
eGFR: 59 mL/min/{1.73_m2} — ABNORMAL LOW (ref >59)

## 2021-06-24 LAB — MICROALBUMIN / CREATININE URINE RATIO
Creatinine, Urine: 53.7 mg/dL
Microalb/Creat Ratio: 33 mg/g creat — ABNORMAL HIGH (ref 0–29)
Microalbumin, Urine: 17.5 ug/mL

## 2021-06-24 LAB — HEMOGLOBIN A1C
Est. average glucose Bld gHb Est-mCnc: 171 mg/dL
Hgb A1c MFr Bld: 7.6 % — ABNORMAL HIGH (ref 4.8–5.6)

## 2021-06-24 LAB — LIPID PANEL
Chol/HDL Ratio: 3.2 ratio (ref 0.0–4.4)
Cholesterol, Total: 186 mg/dL (ref 100–199)
HDL: 59 mg/dL (ref >39)
LDL Chol Calc (NIH): 101 mg/dL — ABNORMAL HIGH (ref 0–99)
Triglycerides: 147 mg/dL (ref 0–149)
VLDL Cholesterol Cal: 26 mg/dL (ref 5–40)

## 2021-06-24 LAB — CARDIOVASCULAR RISK ASSESSMENT

## 2021-06-24 LAB — VITAMIN D 25 HYDROXY (VIT D DEFICIENCY, FRACTURES): Vit D, 25-Hydroxy: 26.3 ng/mL — ABNORMAL LOW (ref 30.0–100.0)

## 2021-06-25 ENCOUNTER — Other Ambulatory Visit: Payer: Self-pay | Admitting: Legal Medicine

## 2021-06-25 ENCOUNTER — Other Ambulatory Visit: Payer: Self-pay

## 2021-06-25 DIAGNOSIS — E559 Vitamin D deficiency, unspecified: Secondary | ICD-10-CM

## 2021-06-25 DIAGNOSIS — R809 Proteinuria, unspecified: Secondary | ICD-10-CM

## 2021-06-25 MED ORDER — VITAMIN D (ERGOCALCIFEROL) 1.25 MG (50000 UNIT) PO CAPS: 50000.0000 [IU] | ORAL_CAPSULE | ORAL | 0 refills | Status: DC

## 2021-06-25 MED ORDER — VITAMIN D (ERGOCALCIFEROL) 1.25 MG (50000 UNIT) PO CAPS: 50000.0000 [IU] | ORAL_CAPSULE | ORAL | 3 refills | Status: DC

## 2021-06-25 NOTE — Progress Notes (Signed)
Glucose 109, kidney tests stage 3a, liver tests norma, A1c 7.6 ok, Liver tests LDL cholesterol 101, microalbuminuria high, get 24 hours rune collection fr protein ?Vitamin D low 26.3, start vitamin d q week ?lp

## 2021-06-26 ENCOUNTER — Other Ambulatory Visit: Payer: Self-pay | Admitting: Legal Medicine

## 2021-06-26 DIAGNOSIS — E559 Vitamin D deficiency, unspecified: Secondary | ICD-10-CM

## 2021-06-26 MED ORDER — VITAMIN D (ERGOCALCIFEROL) 1.25 MG (50000 UNIT) PO CAPS: 50000.0000 [IU] | ORAL_CAPSULE | ORAL | 6 refills | Status: DC

## 2021-06-26 NOTE — Telephone Encounter (Signed)
Patient would like Vit D sent to [facility] in [facility]. Will let PCP know ?

## 2021-06-29 ENCOUNTER — Other Ambulatory Visit: Payer: Medicare HMO

## 2021-06-29 DIAGNOSIS — R809 Proteinuria, unspecified: Secondary | ICD-10-CM

## 2021-06-30 ENCOUNTER — Other Ambulatory Visit: Payer: Self-pay | Admitting: Legal Medicine

## 2021-06-30 DIAGNOSIS — E1169 Type 2 diabetes mellitus with other specified complication: Secondary | ICD-10-CM

## 2021-06-30 LAB — PROTEIN, URINE, 24 HOUR
Protein, 24H Urine: 40 mg/24 hr (ref 30–150)
Protein, Ur: 15.8 mg/dL

## 2021-06-30 NOTE — Progress Notes (Signed)
Urine protein in normal limites, great ?LP

## 2021-07-01 ENCOUNTER — Ambulatory Visit: Payer: Medicare HMO | Admitting: Legal Medicine

## 2021-07-06 DIAGNOSIS — E1169 Type 2 diabetes mellitus with other specified complication: Secondary | ICD-10-CM | POA: Diagnosis not present

## 2021-07-19 ENCOUNTER — Other Ambulatory Visit: Payer: Self-pay | Admitting: Legal Medicine

## 2021-07-19 DIAGNOSIS — E782 Mixed hyperlipidemia: Secondary | ICD-10-CM

## 2021-07-28 ENCOUNTER — Other Ambulatory Visit: Payer: Self-pay

## 2021-07-28 DIAGNOSIS — E1169 Type 2 diabetes mellitus with other specified complication: Secondary | ICD-10-CM

## 2021-07-28 DIAGNOSIS — N952 Postmenopausal atrophic vaginitis: Secondary | ICD-10-CM

## 2021-07-28 DIAGNOSIS — E559 Vitamin D deficiency, unspecified: Secondary | ICD-10-CM

## 2021-07-28 DIAGNOSIS — R1084 Generalized abdominal pain: Secondary | ICD-10-CM

## 2021-07-28 DIAGNOSIS — E782 Mixed hyperlipidemia: Secondary | ICD-10-CM

## 2021-07-28 DIAGNOSIS — M810 Age-related osteoporosis without current pathological fracture: Secondary | ICD-10-CM

## 2021-07-28 DIAGNOSIS — I152 Hypertension secondary to endocrine disorders: Secondary | ICD-10-CM

## 2021-07-28 MED ORDER — LEVEMIR FLEXTOUCH 100 UNIT/ML ~~LOC~~ SOPN: PEN_INJECTOR | SUBCUTANEOUS | 2 refills | Status: DC

## 2021-07-28 MED ORDER — GABAPENTIN 300 MG PO CAPS: 300.0000 mg | ORAL_CAPSULE | Freq: Three times a day (TID) | ORAL | 2 refills | Status: DC

## 2021-07-28 MED ORDER — VICTOZA 18 MG/3ML ~~LOC~~ SOPN: PEN_INJECTOR | SUBCUTANEOUS | 6 refills | Status: DC

## 2021-07-28 MED ORDER — EZETIMIBE 10 MG PO TABS: ORAL_TABLET | ORAL | 2 refills | Status: DC

## 2021-07-28 MED ORDER — ESTRADIOL 0.1 MG/GM VA CREA: TOPICAL_CREAM | VAGINAL | 3 refills | Status: DC

## 2021-07-28 MED ORDER — GLIMEPIRIDE 1 MG PO TABS: 1.0000 mg | ORAL_TABLET | Freq: Every day | ORAL | 2 refills | Status: DC

## 2021-07-28 MED ORDER — METOPROLOL TARTRATE 25 MG PO TABS: 25.0000 mg | ORAL_TABLET | Freq: Two times a day (BID) | ORAL | 2 refills | Status: DC

## 2021-07-28 MED ORDER — ALENDRONATE SODIUM 70 MG PO TABS: ORAL_TABLET | ORAL | 1 refills | Status: DC

## 2021-07-28 MED ORDER — ROSUVASTATIN CALCIUM 40 MG PO TABS: 40.0000 mg | ORAL_TABLET | Freq: Every day | ORAL | 2 refills | Status: DC

## 2021-07-28 MED ORDER — ACCU-CHEK GUIDE VI STRP: 1.0000 | ORAL_STRIP | Freq: Every day | 2 refills | Status: DC

## 2021-07-28 MED ORDER — VITAMIN D (ERGOCALCIFEROL) 1.25 MG (50000 UNIT) PO CAPS: 50000.0000 [IU] | ORAL_CAPSULE | ORAL | 3 refills | Status: DC

## 2021-07-28 MED ORDER — PANTOPRAZOLE SODIUM 40 MG PO TBEC: 40.0000 mg | DELAYED_RELEASE_TABLET | Freq: Every day | ORAL | 2 refills | Status: DC

## 2021-07-28 MED ORDER — ONETOUCH ULTRASOFT LANCETS MISC: 1.0000 | Freq: Every day | 2 refills | Status: DC

## 2021-07-28 NOTE — Telephone Encounter (Signed)
Patient is now using [facility] in [place].  ? ?Rebecca Hodge 07/28/21 10:23 AM ? ?

## 2021-08-05 DIAGNOSIS — E1169 Type 2 diabetes mellitus with other specified complication: Secondary | ICD-10-CM | POA: Diagnosis not present

## 2021-08-27 DIAGNOSIS — E119 Type 2 diabetes mellitus without complications: Secondary | ICD-10-CM | POA: Diagnosis not present

## 2021-08-31 ENCOUNTER — Other Ambulatory Visit: Payer: Self-pay | Admitting: Legal Medicine

## 2021-08-31 DIAGNOSIS — F339 Major depressive disorder, recurrent, unspecified: Secondary | ICD-10-CM

## 2021-09-01 ENCOUNTER — Other Ambulatory Visit: Payer: Self-pay

## 2021-09-01 DIAGNOSIS — Z794 Long term (current) use of insulin: Secondary | ICD-10-CM

## 2021-09-01 DIAGNOSIS — E559 Vitamin D deficiency, unspecified: Secondary | ICD-10-CM

## 2021-09-01 MED ORDER — VITAMIN D (ERGOCALCIFEROL) 1.25 MG (50000 UNIT) PO CAPS: 50000.0000 [IU] | ORAL_CAPSULE | ORAL | 6 refills | Status: DC

## 2021-09-01 MED ORDER — LEVEMIR FLEXTOUCH 100 UNIT/ML ~~LOC~~ SOPN: PEN_INJECTOR | SUBCUTANEOUS | 2 refills | Status: DC

## 2021-09-01 NOTE — Telephone Encounter (Signed)
Please ask patient to call the office to make a fasting follow up appt in 10/2021. Thank you, Dr Tobie Poet

## 2021-09-04 DIAGNOSIS — E1169 Type 2 diabetes mellitus with other specified complication: Secondary | ICD-10-CM | POA: Diagnosis not present

## 2021-09-07 ENCOUNTER — Ambulatory Visit (INDEPENDENT_AMBULATORY_CARE_PROVIDER_SITE_OTHER): Payer: Medicare HMO | Admitting: Nurse Practitioner

## 2021-09-07 ENCOUNTER — Encounter: Payer: Self-pay | Admitting: Nurse Practitioner

## 2021-09-07 VITALS — BP 118/70 | HR 73 | Temp 97.2°F | Ht 64.0 in | Wt 134.0 lb

## 2021-09-07 DIAGNOSIS — R300 Dysuria: Secondary | ICD-10-CM

## 2021-09-07 DIAGNOSIS — J019 Acute sinusitis, unspecified: Secondary | ICD-10-CM

## 2021-09-07 DIAGNOSIS — N3001 Acute cystitis with hematuria: Secondary | ICD-10-CM | POA: Diagnosis not present

## 2021-09-07 DIAGNOSIS — H6123 Impacted cerumen, bilateral: Secondary | ICD-10-CM

## 2021-09-07 DIAGNOSIS — R110 Nausea: Secondary | ICD-10-CM

## 2021-09-07 LAB — POCT URINALYSIS DIP (CLINITEK)
Blood, UA: NEGATIVE
Glucose, UA: NEGATIVE mg/dL
Ketones, POC UA: NEGATIVE mg/dL
Leukocytes, UA: NEGATIVE
Nitrite, UA: NEGATIVE
POC PROTEIN,UA: 30 — AB
Spec Grav, UA: >=1.03 — AB (ref 1.010–1.025)
Urobilinogen, UA: 0.2 E.U./dL
pH, UA: 5.5 (ref 5.0–8.0)

## 2021-09-07 MED ORDER — AMOXICILLIN-POT CLAVULANATE 875-125 MG PO TABS: 1.0000 | ORAL_TABLET | Freq: Two times a day (BID) | ORAL | 0 refills | Status: DC

## 2021-09-07 MED ORDER — ONDANSETRON HCL 4 MG PO TABS: 4.0000 mg | ORAL_TABLET | Freq: Three times a day (TID) | ORAL | 0 refills | Status: DC | PRN

## 2021-09-12 DIAGNOSIS — R32 Unspecified urinary incontinence: Secondary | ICD-10-CM | POA: Diagnosis not present

## 2021-09-21 ENCOUNTER — Other Ambulatory Visit: Payer: Self-pay

## 2021-09-21 DIAGNOSIS — E1169 Type 2 diabetes mellitus with other specified complication: Secondary | ICD-10-CM

## 2021-09-21 MED ORDER — LEVEMIR FLEXTOUCH 100 UNIT/ML ~~LOC~~ SOPN: PEN_INJECTOR | SUBCUTANEOUS | 2 refills | Status: DC

## 2021-09-30 ENCOUNTER — Other Ambulatory Visit: Payer: Self-pay | Admitting: Legal Medicine

## 2021-09-30 DIAGNOSIS — N390 Urinary tract infection, site not specified: Secondary | ICD-10-CM

## 2021-10-04 DIAGNOSIS — E1169 Type 2 diabetes mellitus with other specified complication: Secondary | ICD-10-CM | POA: Diagnosis not present

## 2021-10-13 ENCOUNTER — Other Ambulatory Visit: Payer: Self-pay | Admitting: Legal Medicine

## 2021-10-13 DIAGNOSIS — R32 Unspecified urinary incontinence: Secondary | ICD-10-CM | POA: Diagnosis not present

## 2021-10-13 DIAGNOSIS — E1169 Type 2 diabetes mellitus with other specified complication: Secondary | ICD-10-CM

## 2021-10-20 ENCOUNTER — Other Ambulatory Visit: Payer: Self-pay | Admitting: Legal Medicine

## 2021-10-20 ENCOUNTER — Other Ambulatory Visit: Payer: Self-pay

## 2021-10-20 ENCOUNTER — Ambulatory Visit (INDEPENDENT_AMBULATORY_CARE_PROVIDER_SITE_OTHER): Payer: Medicare HMO

## 2021-10-20 DIAGNOSIS — E782 Mixed hyperlipidemia: Secondary | ICD-10-CM

## 2021-10-20 DIAGNOSIS — M810 Age-related osteoporosis without current pathological fracture: Secondary | ICD-10-CM

## 2021-10-20 DIAGNOSIS — Z794 Long term (current) use of insulin: Secondary | ICD-10-CM

## 2021-10-20 DIAGNOSIS — E1169 Type 2 diabetes mellitus with other specified complication: Secondary | ICD-10-CM

## 2021-10-20 DIAGNOSIS — E559 Vitamin D deficiency, unspecified: Secondary | ICD-10-CM

## 2021-10-20 NOTE — Progress Notes (Signed)
Chronic Care Management Pharmacy Note  10/20/2021 Name:  Rebecca Hodge MRN:  [id number] DOB:  [birth year]  Summary: 81 year old female presents for f/u CCM visit. She lives with her daughter. Has 4 daughters, 12 grandkids, and 12 great grandkids. She likes to walk and camp for fun and has a chiwawa named Cookie she enjoys caring for and walking  Recommendations/Changes made from today's visit: -Lisinopril Dc'd by PCP on 06/22/21, recommend starting alternative ACE/ARB due to kidneys -Recommend DEXA scan, unable to find one in chart -Recommend stop Glimepiride. Patient dropping into 50's once/month. Patient also has no f/u with PCP. Recommend ASAP f/u to assess (Counseled patient to start writing sugars down on one piece of paper and to bring to visit)    Subjective: Rebecca Hodge is an 81 y.o. year old female who is a primary patient of Henrene Pastor, Zeb Comfort, MD.  The CCM team was consulted for assistance with disease management and care coordination needs.    Engaged with patient by telephone for follow up visit in response to provider referral for pharmacy case management and/or care coordination services.   Consent to Services:  The patient was given the following information about Chronic Care Management services today, agreed to services, and gave verbal consent: 1. CCM service includes personalized support from designated clinical staff supervised by the primary care provider, including individualized plan of care and coordination with other care providers 2. 24/7 contact phone numbers for assistance for urgent and routine care needs. 3. Service will only be billed when office clinical staff spend 20 minutes or more in a month to coordinate care. 4. Only one practitioner may furnish and bill the service in a calendar month. 5.The patient may stop CCM services at any time (effective at the end of the month) by phone call to the office staff. 6. The patient will be responsible for cost  sharing (co-pay) of up to 20% of the service fee (after annual deductible is met). Patient agreed to services and consent obtained.  Patient Care Team: Lillard Anes, MD as PCP - General (Family Medicine) Lane Hacker, [facility] as Pharmacist (Pharmacist)  Recent office visits:  11/12/20- Medicare annual wellness exam 07/21/20- Reinaldo Meeker, MD- seen for chronic conditions,  labs ordered, Dexa scan ordered, no medication changes, follow up 6 months  06/03/20- Reinaldo Meeker, MD- seen for dysuria, started mirtazipine 15 mg daily at bedtime, follow up 1 month   Recent consult visits:  No visits noted   Hospital visits:  None in previous 6 months   Objective:  Lab Results  Component Value Date   CREATININE 0.96 06/22/2021   BUN 13 06/22/2021   GFRNONAA 50 (L) 12/24/2019   GFRAA 57 (L) 12/24/2019   NA 142 06/22/2021   K 4.5 06/22/2021   CALCIUM 9.6 06/22/2021   CO2 25 06/22/2021   GLUCOSE 109 (H) 06/22/2021    Lab Results  Component Value Date/Time   HGBA1C 7.6 (H) 06/22/2021 09:55 AM   HGBA1C 7.7 (H) 02/16/2021 10:49 AM   MICROALBUR 30 07/21/2020 11:11 AM   MICROALBUR 30 06/25/2019 09:02 AM    Last diabetic Eye exam:  Lab Results  Component Value Date/Time   HMDIABEYEEXA No Retinopathy 03/19/2020 12:00 AM    Last diabetic Foot exam: No results found for: "HMDIABFOOTEX"   Lab Results  Component Value Date   CHOL 186 06/22/2021   HDL 59 06/22/2021   LDLCALC 101 (H) 06/22/2021   TRIG 147 06/22/2021  CHOLHDL 3.2 06/22/2021       Latest Ref Rng & Units 06/22/2021    9:55 AM 02/16/2021   10:49 AM 07/21/2020   11:07 AM  Hepatic Function  Total Protein 6.0 - 8.5 g/dL 6.5  6.8  6.6   Albumin 3.6 - 4.6 g/dL 4.4  4.5  4.3   AST 0 - 40 IU/L 18  18  36   ALT 0 - 32 IU/L 11  10  35   Alk Phosphatase 44 - 121 IU/L 62  69  63   Total Bilirubin 0.0 - 1.2 mg/dL 0.8  0.6  0.5     No results found for: "TSH", "FREET4"     Latest Ref Rng & Units 02/16/2021    10:49 AM 07/21/2020   11:07 AM 12/24/2019   11:04 AM  CBC  WBC 3.4 - 10.8 x10E3/uL 8.7  6.9  7.3   Hemoglobin 11.1 - 15.9 g/dL 14.4  14.6  14.3   Hematocrit 34.0 - 46.6 % 45.6  44.2  44.4   Platelets 150 - 450 x10E3/uL 230  221  222     Lab Results  Component Value Date/Time   VD25OH 26.3 (L) 06/22/2021 09:55 AM    Clinical ASCVD: Yes  The ASCVD Risk score (Arnett DK, et al., 2019) failed to calculate for the following reasons:   The 2019 ASCVD risk score is only valid for ages 57 to 71       06/22/2021    4:22 PM 06/22/2021    9:23 AM 12/23/2020    3:47 PM  Depression screen PHQ 2/9  Decreased Interest 0 0 0  Down, Depressed, Hopeless 0 0 0  PHQ - 2 Score 0 0 0     Other: (CHADS2VASc if Afib, MMRC or CAT for COPD, ACT, DEXA)  Social History   Tobacco Use  Smoking Status Former   Packs/day: 1.00   Years: 5.00   Total pack years: 5.00   Types: Cigarettes   Quit date: 03/16/1987   Years since quitting: 34.6  Smokeless Tobacco Never   BP Readings from Last 3 Encounters:  09/07/21 118/70  06/22/21 106/72  02/16/21 135/80   Pulse Readings from Last 3 Encounters:  09/07/21 73  06/22/21 76  02/16/21 63   Wt Readings from Last 3 Encounters:  09/07/21 134 lb (60.8 kg)  06/22/21 140 lb (63.5 kg)  02/16/21 140 lb (63.5 kg)   BMI Readings from Last 3 Encounters:  09/07/21 23.00 kg/m  06/22/21 27.34 kg/m  02/16/21 26.45 kg/m    Assessment/Interventions: Review of patient past medical history, allergies, medications, health status, including review of consultants reports, laboratory and other test data, was performed as part of comprehensive evaluation and provision of chronic care management services.   SDOH:  (Social Determinants of Health) assessments and interventions performed: Yes SDOH Interventions    Flowsheet Row Most Recent Value  SDOH Interventions   Financial Strain Interventions Intervention Not Indicated  Transportation Interventions Intervention  Not Indicated      SDOH Screenings   Alcohol Screen: Low Risk  (12/23/2020)   Alcohol Screen    Last Alcohol Screening Score (AUDIT): 0  Depression (PHQ2-9): Low Risk  (06/22/2021)   Depression (PHQ2-9)    PHQ-2 Score: 0  Financial Resource Strain: Low Risk  (10/20/2021)   Overall Financial Resource Strain (CARDIA)    Difficulty of Paying Living Expenses: Not hard at all  Food Insecurity: Not on file  Housing: Not on file  Physical  Activity: Not on file  Social Connections: Not on file  Stress: Not on file  Tobacco Use: Medium Risk (09/07/2021)   Patient History    Smoking Tobacco Use: Former    Smokeless Tobacco Use: Never    Passive Exposure: Not on file  Transportation Needs: No Transportation Needs (10/20/2021)   PRAPARE - Hydrologist (Medical): No    Lack of Transportation (Non-Medical): No    CCM Care Plan  Allergies  Allergen Reactions   Nitrofuran Derivatives Hives and Itching   Codeine    Morphine And Related    Sulfa Antibiotics     Medications Reviewed Today     Reviewed by Lane Hacker, [facility] (Pharmacist) on 10/20/21 at 1120  Med List Status: <None>   Medication Order Taking? Sig Documenting Provider Last Dose Status Informant  ACCU-CHEK GUIDE test strip [id number]  1 each by Other route daily. Check blood sugar fasting in the morning daily. Lillard Anes, MD  Active   alendronate (FOSAMAX) 70 MG tablet [id number]  TAKE 1 TABLET BY MOUTH WEEKLY ON THE SAME DAY EACH WEEK, WITH A FULL GLASS OF WATER AT LEAST 30 MINUTES BEFORE FIRST FOOD OR BEVERAGE OF THE DAY. REMAIN UPRIGHT FOR AT LEAST 30 MINUTES AFTER TAKING Lillard Anes, MD  Active   amoxicillin-clavulanate (AUGMENTIN) 875-125 MG tablet [id number]  Take 1 tablet by mouth 2 (two) times daily. Rip Harbour, NP  Active   BD PEN NEEDLE NANO U/F 32G X 4 MM MISC [id number] No See admin instructions. [provider] Taking Active   Blood Glucose Monitoring  Suppl (ACCU-CHEK GUIDE) w/Device KIT [id number] No 1 each by Other route daily. Lillard Anes, MD Taking Active   estradiol (ESTRACE) 0.1 MG/GM vaginal cream [id number]  INSERT 1 APPLICATORFUL VAGINALLY 3 TIMES A WEEK Lillard Anes, MD  Active   ezetimibe (ZETIA) 10 MG tablet [id number]  TAKE 1 TABLET(10 MG) BY MOUTH DAILY Lillard Anes, MD  Active   gabapentin (NEURONTIN) 300 MG capsule [id number]  Take 1 capsule (300 mg total) by mouth 3 (three) times daily. Lillard Anes, MD  Active   glimepiride (AMARYL) 1 MG tablet [id number]  TAKE 1 TABLET(1 MG) BY MOUTH DAILY BEFORE BREAKFAST Lillard Anes, MD  Active   insulin detemir (LEVEMIR FLEXTOUCH) 100 UNIT/ML FlexPen [id number]  INJECT 12 UNITS SUBCUTANEOUSLY EVERY NIGHT AT BEDTIME Rochel Brome, MD  Active   Lancets [facility] ULTRASOFT) lancets [id number]  1 each by Other route daily. Use as instructed Lillard Anes, MD  Active   liraglutide (VICTOZA) 18 MG/3ML SOPN [id number]  INJECT 0.2 MILLILITERS (1.2 MG TOTAL) UNDER THE SKIN DAILY. Lillard Anes, MD  Active   metoprolol tartrate (LOPRESSOR) 25 MG tablet [id number]  Take 1 tablet (25 mg total) by mouth 2 (two) times daily. Lillard Anes, MD  Active   mirtazapine (REMERON) 15 MG tablet [id number]  TAKE 1 TABLET(15 MG) BY MOUTH AT BEDTIME Lillard Anes, MD  Active   nitrofurantoin (MACRODANTIN) 50 MG capsule [id number]  TAKE 1 CAPSULE(50 MG) BY MOUTH AT BEDTIME Lillard Anes, MD  Active   ondansetron [facility]) 4 MG tablet [id number]  Take 1 tablet (4 mg total) by mouth every 8 (eight) hours as needed for nausea or vomiting. Rip Harbour, NP  Active   pantoprazole (PROTONIX) 40 MG tablet [id number]  Take 1 tablet (40 mg total) by mouth daily. Lillard Anes, MD  Active  rosuvastatin (CRESTOR) 40 MG tablet [id number]  Take 1 tablet (40 mg total) by mouth at bedtime. Lillard Anes, MD  Active   Vitamin D,  Ergocalciferol, (DRISDOL) 1.25 MG (50000 UNIT) CAPS capsule [id number]  Take 1 capsule (50,000 Units total) by mouth every 7 (seven) days. For one month, then one pill a month Lillard Anes, MD  Active   Vitamin D, Ergocalciferol, (DRISDOL) 1.25 MG (50000 UNIT) CAPS capsule [id number]  Take 1 capsule (50,000 Units total) by mouth every 7 (seven) days. Rochel Brome, MD  Active             Patient Active Problem List   Diagnosis Date Noted   Depression, recurrent ([place]) 06/22/2021   Atherosclerosis of aorta ([place]) 06/22/2021   Sleep disorder 06/03/2020   Sequelae of poliomyelitis 06/18/2019   Dysphasia following cerebral infarction 06/18/2019   Mixed incontinence 06/18/2019   Type 2 diabetes mellitus with other specified complication ([place]) 23/55/7322   Age-related osteoporosis without current pathological fracture 06/18/2019   Long term (current) use of insulin ([place]) 06/18/2019   Postmenopausal atrophic vaginitis 06/18/2019   Mixed hyperlipidemia 06/18/2019   Solitary pulmonary nodule 12/13/2012    Immunization History  Administered Date(s) Administered   Fluad Quad(high Dose 65+) 12/24/2019   Influenza Split 12/14/2011, 12/13/2012   Influenza-Unspecified 11/14/2020   Moderna SARS-COV2 Booster Vaccination 06/03/2020   PFIZER(Purple Top)SARS-COV-2 Vaccination 06/21/2019, 07/16/2019   Pneumococcal Conjugate-13 08/31/2017   Pneumococcal Polysaccharide-23 06/11/2016    Conditions to be addressed/monitored:  Hypertension, Hyperlipidemia, Diabetes, and Depression  Care Plan : [place]  Updates made by Lane Hacker, [place] since 10/20/2021 12:00 AM     Problem: DM, HTN, Lipids, Osteopenia   Priority: High  Onset Date: 11/21/2020     Long-Range Goal: Disease State Management   Start Date: 11/21/2020  Expected End Date: 11/21/2021  Recent Progress: On track  Priority: High  Note:   Current Barriers:  Unable to achieve control of DM  Does not adhere to  prescribed medication regimen Does not maintain contact with provider office Does not contact provider office for questions/concerns  Pharmacist Clinical Goal(s):  Patient will achieve adherence to monitoring guidelines and medication adherence to achieve therapeutic efficacy contact provider office for questions/concerns as evidenced notation of same in electronic health record through collaboration with PharmD and provider.   Interventions: 1:1 collaboration with Lillard Anes, MD regarding development and update of comprehensive plan of care as evidenced by provider attestation and co-signature Inter-disciplinary care team collaboration (see longitudinal plan of care) Comprehensive medication review performed; medication list updated in electronic medical record  Hypertension (BP goal <140/90) BP Readings from Last 3 Encounters:  09/07/21 118/70  06/22/21 106/72  02/16/21 135/80  -Controlled -Current treatment: Metoprolol 4m BID Appropriate, Effective, Safe, Accessible -Medications previously tried: Lisinopril (06/22/21, no reason listed) -Current home readings: Not testing -Current dietary habits: "Tries to eat healthy" -Current exercise habits: Walking her dog -Denies hypotensive/hypertensive symptoms -Educated on  Extensive time spent on counseling patient on blood pressure goal and impact of each antihypertensive medication on their blood pressure and risk reduction for CV disease.  Used analogies to explain the need for multiple antihypertensive medications to achieve BP goals and that it is often a silent disease with no symptoms.  -Counseled to monitor BP at home, document, and provide log at future appointments August 2023: Patient was Dc'd from Lisinopril on 06/22/21 but no reason listed in note. Will ask PCP  Hyperlipidemia: (LDL goal <  100) Lab Results  Component Value Date   CHOL 186 06/22/2021   CHOL 178 02/16/2021   CHOL 160 07/21/2020   Lab Results   Component Value Date   HDL 59 06/22/2021   HDL 61 02/16/2021   HDL 70 07/21/2020   Lab Results  Component Value Date   LDLCALC 101 (H) 06/22/2021   LDLCALC 97 02/16/2021   LDLCALC 68 07/21/2020   Lab Results  Component Value Date   TRIG 147 06/22/2021   TRIG 114 02/16/2021   TRIG 127 07/21/2020   Lab Results  Component Value Date   CHOLHDL 3.2 06/22/2021   CHOLHDL 2.9 02/16/2021   CHOLHDL 2.3 07/21/2020  No results found for: "LDLDIRECT" -Controlled -Current treatment: Rosuvastatin 51m Appropriate, Effective, Safe, Accessible Ezetimibe 126mAppropriate, Effective, Safe, Accessible -Medications previously tried: N/A  -Current dietary habits: "Tries to eat healthy" -Current exercise habits: Walking her dog -Educated on: Over 15 minutes spent counseling patient on role of hyperlipidemia on heart disease and the impact of each lipid subclass with emphasis on LDL and heart disease risk.  Explained role of statins in prevention of CV disease complications and actual risk for adverse effects with statin treatment.  Counseled patient on genetic component placing them at risk for hyperlipidemia. -Recommended to continue current medication  Diabetes (A1c goal <8%) Lab Results  Component Value Date   HGBA1C 7.6 (H) 06/22/2021   HGBA1C 7.7 (H) 02/16/2021   HGBA1C 8.3 (H) 07/21/2020   Lab Results  Component Value Date   MICROALBUR 30 07/21/2020   LDLCALC 101 (H) 06/22/2021   CREATININE 0.96 06/22/2021   Lab Results  Component Value Date   NA 142 06/22/2021   K 4.5 06/22/2021   CREATININE 0.96 06/22/2021   GFRNONAA 50 (L) 12/24/2019   GFRAA 57 (L) 12/24/2019   GLUCOSE 109 (H) 06/22/2021   Lab Results  Component Value Date   WBC 8.7 02/16/2021   HGB 14.4 02/16/2021   HCT 45.6 02/16/2021   MCV 87 02/16/2021   PLT 230 02/16/2021  -Controlled -Current medications: Liraglutide 1.34m35mppropriate, Effective, Safe, Accessible Detemir 12 units QD Appropriate, Effective,  Safe, Accessible Glimepiride 1mg72m Query Appropriate,  -Medications previously tried: N/A  -Current home glucose readings (Tests daily) fasting glucose:  August 2023: Doesn't write sugars down. Counseled extensively that she needs to start. Especially since when I asked her what she remembered she told me a week ago Wed her sugars were 195, then when I asked her what day her sugars were in the 70's she said Wed post prandial glucose: only tests fasting -Denies hypoglycemic/hyperglycemic symptoms -Current meal patterns:  breakfast: Didn't specify  lunch: Didn't specify dinner:Didn't specify snacks: Didn't specify drinks: Didn't specify -Current exercise: Walks dog -Educated on A1c and blood sugar goals;  *Extensive time was spent counseling patient on the pathophysiology and risk for complications with uncontrolled Diabetes.  Used teach back method for counseling on ADA diabetic diet.  Time spent on explaining role of carbohydrates versus sugars impacting blood glucose levels and meal planning. Current exercise consists of walking 10 minutes three times a week, plan is to increase to 20 minutes 5 days a week and then after 2-3 weeks increase to 30 minute 5 days a week.  -Counseled to check feet daily and get yearly eye exams Sept 2022:  -Will ask PCP which Liraglutide dose he wishes patient to be on -Will have Concierge check on patient's sugars monthly until we get her below 7 -Will have patient try  to schedule f/u with PCP, has no future visits Feb 2023: Will ask PCP about Liraglutide again August 2023: Patient has no f/u with PCP. Stated her sugars drop to 50's once/month. Forwarded msg to PCP and Lemmie Evens about scheduling f/u ASAP to assess. -Recommend DC Glimepiride since already on insulin  Depression/Anxiety    06/22/2021    4:22 PM 06/22/2021    9:23 AM 12/23/2020    3:47 PM  Depression screen PHQ 2/9  Decreased Interest 0 0 0  Down, Depressed, Hopeless 0 0 0  PHQ - 2 Score 0 0  0      11/12/2020    6:33 PM  GAD 7 : Generalized Anxiety Score  Nervous, Anxious, on Edge 0  Control/stop worrying 0  Worry too much - different things 0  Trouble relaxing 0  Restless 0  Easily annoyed or irritable 0  Afraid - awful might happen 0  Total GAD 7 Score 0  -Controlled -Current treatment: None -Medications previously tried/failed: Mirtazapine Michela Pitcher PCP took off) -Educated on Benefits of medication for symptom control Sept 2022: Unsure when mirtazapine was started and patient is non-compliant, will ask PCP if it's needed (Considering her age and risk factors) Recommend continue on non-therapy  Osteoporosis / Osteopenia (Goal Prevent fracture) -Not ideally controlled -Last DEXA Scan: Unknown -Patient is a candidate for pharmacologic treatment due to already being on treatment -Current treatment  Alendronate 54m (Started 2022 per patient) Appropriate, Query effective,  -Medications previously tried: N/A  -Recommend [phone] units of vitamin D daily. Recommend 1200 mg of calcium daily from dietary and supplemental sources. August 2023: Recommend DEXA for patient. She states it's been at least 3 years but unable to see in chart     Patient Goals/Self-Care Activities Patient will:  - take medications as prescribed  Follow Up Plan: The patient has been provided with contact information for the care management team and has been advised to call with any health related questions or concerns.   F/U December 2023  [place] Constable PSherian ReinD. -[phone]       Medication Assistance:  N/A  [id number] [place] - [address] [place]Phone: [phone]: [phone]  Uses pill box? Yes Pt endorses 100% compliance (Fill Hx shows she's non-compliant  We discussed: Benefits of medication synchronization, packaging and delivery as well as  enhanced pharmacist oversight with [facility]. Patient decided to: Utilize [facility] pharmacy for medication synchronization, packaging and delivery  Care Plan and Follow Up Patient Decision:  Patient agrees to Care Plan and Follow-up.  Plan: The patient has been provided with contact information for the care management team and has been advised to call with any health related questions or concerns.   CPP F/U December 2023  [place] Constable PSherian ReinD. -- [phone]

## 2021-10-20 NOTE — Patient Instructions (Signed)
Visit Information   Goals Addressed   None    Patient Care Plan: CCM Pharmacy Care Plan     Problem Identified: DM, HTN, Lipids, Osteopenia   Priority: High  Onset Date: 11/21/2020     Long-Range Goal: Disease State Management   Start Date: 11/21/2020  Expected End Date: 11/21/2021  Recent Progress: On track  Priority: High  Note:   Current Barriers:  Unable to achieve control of DM  Does not adhere to prescribed medication regimen Does not maintain contact with provider office Does not contact provider office for questions/concerns  Pharmacist Clinical Goal(s):  Patient will achieve adherence to monitoring guidelines and medication adherence to achieve therapeutic efficacy contact provider office for questions/concerns as evidenced notation of same in electronic health record through collaboration with PharmD and provider.   Interventions: 1:1 collaboration with Lillard Anes, MD regarding development and update of comprehensive plan of care as evidenced by provider attestation and co-signature Inter-disciplinary care team collaboration (see longitudinal plan of care) Comprehensive medication review performed; medication list updated in electronic medical record  Hypertension (BP goal <140/90) BP Readings from Last 3 Encounters:  09/07/21 118/70  06/22/21 106/72  02/16/21 135/80  -Controlled -Current treatment: Metoprolol '25mg'$  BID Appropriate, Effective, Safe, Accessible -Medications previously tried: Lisinopril (06/22/21, no reason listed) -Current home readings: Not testing -Current dietary habits: "Tries to eat healthy" -Current exercise habits: Walking her dog -Denies hypotensive/hypertensive symptoms -Educated on  Extensive time spent on counseling patient on blood pressure goal and impact of each antihypertensive medication on their blood pressure and risk reduction for CV disease.  Used analogies to explain the need for multiple antihypertensive medications  to achieve BP goals and that it is often a silent disease with no symptoms.  -Counseled to monitor BP at home, document, and provide log at future appointments August 2023: Patient was Dc'd from Lisinopril on 06/22/21 but no reason listed in note. Will ask PCP  Hyperlipidemia: (LDL goal < 100) Lab Results  Component Value Date   CHOL 186 06/22/2021   CHOL 178 02/16/2021   CHOL 160 07/21/2020   Lab Results  Component Value Date   HDL 59 06/22/2021   HDL 61 02/16/2021   HDL 70 07/21/2020   Lab Results  Component Value Date   LDLCALC 101 (H) 06/22/2021   LDLCALC 97 02/16/2021   LDLCALC 68 07/21/2020   Lab Results  Component Value Date   TRIG 147 06/22/2021   TRIG 114 02/16/2021   TRIG 127 07/21/2020   Lab Results  Component Value Date   CHOLHDL 3.2 06/22/2021   CHOLHDL 2.9 02/16/2021   CHOLHDL 2.3 07/21/2020  No results found for: "LDLDIRECT" -Controlled -Current treatment: Rosuvastatin '40mg'$  Appropriate, Effective, Safe, Accessible Ezetimibe '10mg'$  Appropriate, Effective, Safe, Accessible -Medications previously tried: N/A  -Current dietary habits: "Tries to eat healthy" -Current exercise habits: Walking her dog -Educated on: Over 15 minutes spent counseling patient on role of hyperlipidemia on heart disease and the impact of each lipid subclass with emphasis on LDL and heart disease risk.  Explained role of statins in prevention of CV disease complications and actual risk for adverse effects with statin treatment.  Counseled patient on genetic component placing them at risk for hyperlipidemia. -Recommended to continue current medication  Diabetes (A1c goal <8%) Lab Results  Component Value Date   HGBA1C 7.6 (H) 06/22/2021   HGBA1C 7.7 (H) 02/16/2021   HGBA1C 8.3 (H) 07/21/2020   Lab Results  Component Value Date   MICROALBUR 30 07/21/2020  LDLCALC 101 (H) 06/22/2021   CREATININE 0.96 06/22/2021   Lab Results  Component Value Date   NA 142 06/22/2021   K 4.5  06/22/2021   CREATININE 0.96 06/22/2021   GFRNONAA 50 (L) 12/24/2019   GFRAA 57 (L) 12/24/2019   GLUCOSE 109 (H) 06/22/2021   Lab Results  Component Value Date   WBC 8.7 02/16/2021   HGB 14.4 02/16/2021   HCT 45.6 02/16/2021   MCV 87 02/16/2021   PLT 230 02/16/2021  -Controlled -Current medications: Liraglutide 1.'8mg'$  Appropriate, Effective, Safe, Accessible Detemir 12 units QD Appropriate, Effective, Safe, Accessible Glimepiride '1mg'$  AM Query Appropriate,  -Medications previously tried: N/A  -Current home glucose readings (Tests daily) fasting glucose:  August 2023: Doesn't write sugars down. Counseled extensively that she needs to start. Especially since when I asked her what she remembered she told me a week ago Wed her sugars were 195, then when I asked her what day her sugars were in the 70's she said Wed post prandial glucose: only tests fasting -Denies hypoglycemic/hyperglycemic symptoms -Current meal patterns:  breakfast: Didn't specify  lunch: Didn't specify dinner:Didn't specify snacks: Didn't specify drinks: Didn't specify -Current exercise: Walks dog -Educated on A1c and blood sugar goals;  *Extensive time was spent counseling patient on the pathophysiology and risk for complications with uncontrolled Diabetes.  Used teach back method for counseling on ADA diabetic diet.  Time spent on explaining role of carbohydrates versus sugars impacting blood glucose levels and meal planning. Current exercise consists of walking 10 minutes three times a week, plan is to increase to 20 minutes 5 days a week and then after 2-3 weeks increase to 30 minute 5 days a week.  -Counseled to check feet daily and get yearly eye exams Sept 2022:  -Will ask PCP which Liraglutide dose he wishes patient to be on -Will have Concierge check on patient's sugars monthly until we get her below 7 -Will have patient try to schedule f/u with PCP, has no future visits Feb 2023: Will ask PCP about  Liraglutide again August 2023: Patient has no f/u with PCP. Stated her sugars drop to 50's once/month. Forwarded msg to PCP and Lemmie Evens about scheduling f/u ASAP to assess. -Recommend DC Glimepiride since already on insulin  Depression/Anxiety    06/22/2021    4:22 PM 06/22/2021    9:23 AM 12/23/2020    3:47 PM  Depression screen PHQ 2/9  Decreased Interest 0 0 0  Down, Depressed, Hopeless 0 0 0  PHQ - 2 Score 0 0 0      11/12/2020    6:33 PM  GAD 7 : Generalized Anxiety Score  Nervous, Anxious, on Edge 0  Control/stop worrying 0  Worry too much - different things 0  Trouble relaxing 0  Restless 0  Easily annoyed or irritable 0  Afraid - awful might happen 0  Total GAD 7 Score 0  -Controlled -Current treatment: None -Medications previously tried/failed: Mirtazapine Michela Pitcher PCP took off) -Educated on Benefits of medication for symptom control Sept 2022: Unsure when mirtazapine was started and patient is non-compliant, will ask PCP if it's needed (Considering her age and risk factors) Recommend continue on non-therapy  Osteoporosis / Osteopenia (Goal Prevent fracture) -Not ideally controlled -Last DEXA Scan: Unknown -Patient is a candidate for pharmacologic treatment due to already being on treatment -Current treatment  Alendronate '70mg'$  (Started 2022 per patient) Appropriate, Query effective,  -Medications previously tried: N/A  -Recommend [id number] units of vitamin D daily. Recommend 1200 mg  of calcium daily from dietary and supplemental sources. August 2023: Recommend DEXA for patient. She states it's been at least 3 years but unable to see in chart     Patient Goals/Self-Care Activities Patient will:  - take medications as prescribed  Follow Up Plan: The patient has been provided with contact information for the care management team and has been advised to call with any health related questions or concerns.   F/U December 2023  [place] Constable, Sherian Rein.D. -  [phone]       Ms. Passero was given information about Chronic Care Management services today including:  CCM service includes personalized support from designated clinical staff supervised by her physician, including individualized plan of care and coordination with other care providers 24/7 contact phone numbers for assistance for urgent and routine care needs. Standard insurance, coinsurance, copays and deductibles apply for chronic care management only during months in which we provide at least 20 minutes of these services. Most insurances cover these services at 100%, however patients may be responsible for any copay, coinsurance and/or deductible if applicable. This service may help you avoid the need for more expensive face-to-face services. Only one practitioner may furnish and bill the service in a calendar month. The patient may stop CCM services at any time (effective at the end of the month) by phone call to the office staff.  Patient agreed to services and verbal consent obtained.   The patient verbalized understanding of instructions, educational materials, and care plan provided today and DECLINED offer to receive copy of patient instructions, educational materials, and care plan.  The pharmacy team will reach out to the patient again over the next 30 days.   Lane Hacker, [place]

## 2021-10-29 ENCOUNTER — Other Ambulatory Visit: Payer: Medicare HMO

## 2021-10-29 DIAGNOSIS — E1169 Type 2 diabetes mellitus with other specified complication: Secondary | ICD-10-CM

## 2021-10-29 DIAGNOSIS — E559 Vitamin D deficiency, unspecified: Secondary | ICD-10-CM | POA: Diagnosis not present

## 2021-10-29 DIAGNOSIS — M810 Age-related osteoporosis without current pathological fracture: Secondary | ICD-10-CM

## 2021-10-29 DIAGNOSIS — E782 Mixed hyperlipidemia: Secondary | ICD-10-CM | POA: Diagnosis not present

## 2021-10-29 DIAGNOSIS — Z794 Long term (current) use of insulin: Secondary | ICD-10-CM | POA: Diagnosis not present

## 2021-10-30 LAB — COMPREHENSIVE METABOLIC PANEL
ALT: 17 IU/L (ref 0–32)
AST: 20 IU/L (ref 0–40)
Albumin/Globulin Ratio: 2 (ref 1.2–2.2)
Albumin: 4.4 g/dL (ref 3.7–4.7)
Alkaline Phosphatase: 63 IU/L (ref 44–121)
BUN/Creatinine Ratio: 11 — ABNORMAL LOW (ref 12–28)
BUN: 10 mg/dL (ref 8–27)
Bilirubin Total: 0.8 mg/dL (ref 0.0–1.2)
CO2: 25 mmol/L (ref 20–29)
Calcium: 9 mg/dL (ref 8.7–10.3)
Chloride: 105 mmol/L (ref 96–106)
Creatinine, Ser: 0.9 mg/dL (ref 0.57–1.00)
Globulin, Total: 2.2 g/dL (ref 1.5–4.5)
Glucose: 75 mg/dL (ref 70–99)
Potassium: 4.1 mmol/L (ref 3.5–5.2)
Sodium: 144 mmol/L (ref 134–144)
Total Protein: 6.6 g/dL (ref 6.0–8.5)
eGFR: 64 mL/min/{1.73_m2} (ref >59)

## 2021-10-30 LAB — CBC WITH DIFFERENTIAL/PLATELET
Basophils Absolute: 0.1 10*3/uL (ref 0.0–0.2)
Basos: 1 %
EOS (ABSOLUTE): 0.2 10*3/uL (ref 0.0–0.4)
Eos: 3 %
Hematocrit: 45.4 % (ref 34.0–46.6)
Hemoglobin: 13.8 g/dL (ref 11.1–15.9)
Immature Grans (Abs): 0 10*3/uL (ref 0.0–0.1)
Immature Granulocytes: 0 %
Lymphocytes Absolute: 3.1 10*3/uL (ref 0.7–3.1)
Lymphs: 37 %
MCH: 26.6 pg (ref 26.6–33.0)
MCHC: 30.4 g/dL — ABNORMAL LOW (ref 31.5–35.7)
MCV: 88 fL (ref 79–97)
Monocytes Absolute: 0.6 10*3/uL (ref 0.1–0.9)
Monocytes: 8 %
Neutrophils Absolute: 4.3 10*3/uL (ref 1.4–7.0)
Neutrophils: 51 %
Platelets: 266 10*3/uL (ref 150–450)
RBC: 5.19 x10E6/uL (ref 3.77–5.28)
RDW: 15.1 % (ref 11.7–15.4)
WBC: 8.4 10*3/uL (ref 3.4–10.8)

## 2021-10-30 LAB — LIPID PANEL
Chol/HDL Ratio: 2.7 ratio (ref 0.0–4.4)
Cholesterol, Total: 155 mg/dL (ref 100–199)
HDL: 58 mg/dL (ref >39)
LDL Chol Calc (NIH): 77 mg/dL (ref 0–99)
Triglycerides: 112 mg/dL (ref 0–149)
VLDL Cholesterol Cal: 20 mg/dL (ref 5–40)

## 2021-10-30 LAB — VITAMIN D 25 HYDROXY (VIT D DEFICIENCY, FRACTURES): Vit D, 25-Hydroxy: 74.8 ng/mL (ref 30.0–100.0)

## 2021-10-30 LAB — CARDIOVASCULAR RISK ASSESSMENT

## 2021-10-30 LAB — HEMOGLOBIN A1C
Est. average glucose Bld gHb Est-mCnc: 166 mg/dL
Hgb A1c MFr Bld: 7.4 % — ABNORMAL HIGH (ref 4.8–5.6)

## 2021-10-30 NOTE — Progress Notes (Signed)
Kidney tests normal, Liver tests normal, A1c 7.4 good, CBC normal, Cholesterol normal, Vitamin D 74.8 normal,  lp

## 2021-11-03 ENCOUNTER — Ambulatory Visit (INDEPENDENT_AMBULATORY_CARE_PROVIDER_SITE_OTHER): Payer: Medicare HMO | Admitting: Legal Medicine

## 2021-11-03 ENCOUNTER — Encounter: Payer: Self-pay | Admitting: Legal Medicine

## 2021-11-03 VITALS — BP 120/80 | HR 67 | Temp 98.5°F | Ht 64.0 in | Wt 135.0 lb

## 2021-11-03 DIAGNOSIS — Z794 Long term (current) use of insulin: Secondary | ICD-10-CM | POA: Diagnosis not present

## 2021-11-03 DIAGNOSIS — E1169 Type 2 diabetes mellitus with other specified complication: Secondary | ICD-10-CM

## 2021-11-03 DIAGNOSIS — I700 Atherosclerosis of aorta: Secondary | ICD-10-CM

## 2021-11-03 DIAGNOSIS — R300 Dysuria: Secondary | ICD-10-CM | POA: Diagnosis not present

## 2021-11-03 DIAGNOSIS — N3946 Mixed incontinence: Secondary | ICD-10-CM | POA: Diagnosis not present

## 2021-11-03 DIAGNOSIS — F339 Major depressive disorder, recurrent, unspecified: Secondary | ICD-10-CM

## 2021-11-03 DIAGNOSIS — Z1231 Encounter for screening mammogram for malignant neoplasm of breast: Secondary | ICD-10-CM

## 2021-11-03 DIAGNOSIS — E782 Mixed hyperlipidemia: Secondary | ICD-10-CM | POA: Diagnosis not present

## 2021-11-03 LAB — POCT URINALYSIS DIP (CLINITEK)
Bilirubin, UA: NEGATIVE
Blood, UA: NEGATIVE
Glucose, UA: NEGATIVE mg/dL
Ketones, POC UA: NEGATIVE mg/dL
Leukocytes, UA: NEGATIVE
Nitrite, UA: NEGATIVE
Spec Grav, UA: >=1.03 — AB (ref 1.010–1.025)
Urobilinogen, UA: 0.2 E.U./dL
pH, UA: 5 (ref 5.0–8.0)

## 2021-11-03 NOTE — Progress Notes (Signed)
Subjective:  Patient ID: Rebecca Hodge, female    DOB: 11-04-1940  Age: 81 y.o. MRN: [id number]  Chief Complaint  Patient presents with   Hyperlipidemia   Coronary Artery Disease   Diabetes    HPI: chronic  Patient present with type 2 diabetes.  Compliance with treatment has been good; patient take medicines as directed, maintains diet and exercise regimen, follows up as directed, and is keeping glucose diary.  Current medicines for diabetes Levemir 12 Units every night at bedtime, Victoza 1.2 mg daily. Patient performs foot exams daily and last ophthalmologic exam was 03/19/2020. Last A1C was 7.4%. Staying on diet.  Needs to drink more.  Patient presents with hyperlipidemia.  Compliance with treatment has been good; patient takes medicines as directed, maintains low cholesterol diet, follows up as directed, and maintains exercise regimen.  Patient is using Rosuvastatin 40 mg daily, Zetia 10 mg daily without problems.   GERD: Taking Pantoprazole 40 mg daily.  Osteoporosis: She takes Fosamax 70 mg daily.   Current Outpatient Medications on File Prior to Visit  Medication Sig Dispense Refill   ACCU-CHEK GUIDE test strip 1 each by Other route daily. Check blood sugar fasting in the morning daily. 100 each 2   alendronate (FOSAMAX) 70 MG tablet TAKE 1 TABLET BY MOUTH WEEKLY ON THE SAME DAY EACH WEEK, WITH A FULL GLASS OF WATER AT LEAST 30 MINUTES BEFORE FIRST FOOD OR BEVERAGE OF THE DAY. REMAIN UPRIGHT FOR AT LEAST 30 MINUTES AFTER TAKING 12 tablet 1   BD PEN NEEDLE NANO U/F 32G X 4 MM MISC See admin instructions.     Blood Glucose Monitoring Suppl (ACCU-CHEK GUIDE) w/Device KIT 1 each by Other route daily. 1 kit 0   estradiol (ESTRACE) 0.1 MG/GM vaginal cream INSERT 1 APPLICATORFUL VAGINALLY 3 TIMES A WEEK 42.5 g 3   ezetimibe (ZETIA) 10 MG tablet TAKE 1 TABLET(10 MG) BY MOUTH DAILY 90 tablet 2   gabapentin (NEURONTIN) 300 MG capsule Take 1 capsule (300 mg total) by mouth 3 (three)  times daily. 270 capsule 2   insulin detemir (LEVEMIR FLEXTOUCH) 100 UNIT/ML FlexPen INJECT 12 UNITS SUBCUTANEOUSLY EVERY NIGHT AT BEDTIME 15 mL 2   Lancets (ONETOUCH ULTRASOFT) lancets 1 each by Other route daily. Use as instructed 100 each 2   liraglutide (VICTOZA) 18 MG/3ML SOPN INJECT 0.2 MILLILITERS (1.2 MG TOTAL) UNDER THE SKIN DAILY. 6 mL 6   metoprolol tartrate (LOPRESSOR) 25 MG tablet Take 1 tablet (25 mg total) by mouth 2 (two) times daily. 180 tablet 2   mirtazapine (REMERON) 15 MG tablet TAKE 1 TABLET(15 MG) BY MOUTH AT BEDTIME 30 tablet 3   nitrofurantoin (MACRODANTIN) 50 MG capsule TAKE 1 CAPSULE(50 MG) BY MOUTH AT BEDTIME 90 capsule 2   pantoprazole (PROTONIX) 40 MG tablet Take 1 tablet (40 mg total) by mouth daily. 90 tablet 2   rosuvastatin (CRESTOR) 40 MG tablet Take 1 tablet (40 mg total) by mouth at bedtime. 90 tablet 2   Vitamin D, Ergocalciferol, (DRISDOL) 1.25 MG (50000 UNIT) CAPS capsule Take 1 capsule (50,000 Units total) by mouth every 7 (seven) days. 12 capsule 6   No current facility-administered medications on file prior to visit.   Past Medical History:  Diagnosis Date   Age-related osteoporosis without current pathological fracture 06/18/2019   Allergic rhinitis    BMI 27.0-27.9,adult 08/17/2019   Diabetes ([place])    Dysphasia following cerebral infarction 06/18/2019   Hyperlipidemia    Hypertension    Mixed  hyperlipidemia 06/18/2019   Mixed incontinence 06/18/2019   Sequelae of poliomyelitis 06/18/2019   Type 2 diabetes mellitus with other specified complication ([place]) 09/13/6201   Past Surgical History:  Procedure Laterality Date   ABDOMINAL HYSTERECTOMY     CATARACT EXTRACTION Right    CHOLECYSTECTOMY     ROTATOR CUFF REPAIR Bilateral     History reviewed. No pertinent family history. Social History   Socioeconomic History   Marital status: Widowed    Spouse name: Not on file   Number of children: Not on file   Years of education: Not on file   Highest  education level: Not on file  Occupational History   Occupation: Disabled  Tobacco Use   Smoking status: Former    Packs/day: 1.00    Years: 5.00    Total pack years: 5.00    Types: Cigarettes    Quit date: 03/16/1987    Years since quitting: 34.6   Smokeless tobacco: Never  Substance and Sexual Activity   Alcohol use: No   Drug use: No   Sexual activity: Not Currently  Other Topics Concern   Not on file  Social History Narrative   Not on file   Social Determinants of Health   Financial Resource Strain: Low Risk  (10/20/2021)   Overall Financial Resource Strain (CARDIA)    Difficulty of Paying Living Expenses: Not hard at all  Food Insecurity: Not on file  Transportation Needs: No Transportation Needs (10/20/2021)   PRAPARE - Hydrologist (Medical): No    Lack of Transportation (Non-Medical): No  Physical Activity: Not on file  Stress: Not on file  Social Connections: Not on file    Review of Systems  Constitutional:  Negative for chills, fatigue and fever.  HENT:  Negative for congestion, ear pain and sore throat.   Eyes:  Negative for visual disturbance.  Respiratory:  Negative for cough and shortness of breath.   Cardiovascular:  Negative for chest pain and palpitations.  Gastrointestinal:  Negative for abdominal pain, constipation, diarrhea, nausea and vomiting.  Endocrine: Negative for polydipsia, polyphagia and polyuria.  Genitourinary:  Negative for difficulty urinating and dysuria.  Musculoskeletal:  Negative for arthralgias, back pain and myalgias.  Skin:  Negative for rash.  Neurological:  Negative for headaches.  Psychiatric/Behavioral:  Negative for dysphoric mood. The patient is not nervous/anxious.      Objective:  BP 120/80   Pulse 67   Temp 98.5 F (36.9 C)   Ht 5' 4"  (1.626 m)   Wt 135 lb (61.2 kg)   LMP  (LMP Unknown)   SpO2 91%   BMI 23.17 kg/m      11/03/2021    2:23 PM 09/07/2021   10:10 AM 06/22/2021    9:15  AM  BP/Weight  Systolic BP 559 741 638  Diastolic BP 80 70 72  Wt. (Lbs) 135 134 140  BMI 23.17 kg/m2 23 kg/m2 27.34 kg/m2    Physical Exam Vitals reviewed.  Constitutional:      General: She is not in acute distress.    Appearance: Normal appearance.  HENT:     Head: Normocephalic and atraumatic.     Right Ear: Tympanic membrane normal.     Left Ear: Tympanic membrane normal.     Nose: Nose normal.     Mouth/Throat:     Mouth: Mucous membranes are moist.     Pharynx: Oropharynx is clear.  Eyes:     Extraocular Movements: Extraocular  movements intact.     Conjunctiva/sclera: Conjunctivae normal.     Pupils: Pupils are equal, round, and reactive to light.  Cardiovascular:     Rate and Rhythm: Normal rate and regular rhythm.     Pulses: Normal pulses.     Heart sounds: Normal heart sounds. No murmur heard.    No gallop.  Pulmonary:     Effort: Pulmonary effort is normal. No respiratory distress.     Breath sounds: Normal breath sounds. No wheezing.  Abdominal:     General: Abdomen is flat. Bowel sounds are normal. There is no distension.     Palpations: Abdomen is soft.     Tenderness: There is no abdominal tenderness.  Musculoskeletal:        General: Normal range of motion.     Cervical back: Normal range of motion.     Right lower leg: No edema.     Left lower leg: No edema.  Skin:    General: Skin is warm.     Capillary Refill: Capillary refill takes less than 2 seconds.  Neurological:     General: No focal deficit present.     Mental Status: She is alert and oriented to person, place, and time. Mental status is at baseline.     Cranial Nerves: No cranial nerve deficit.     Motor: No weakness.  Psychiatric:        Mood and Affect: Mood normal.        Thought Content: Thought content normal.        Judgment: Judgment normal.     Diabetic Foot Exam - Simple   Simple Foot Form Diabetic Foot exam was performed with the following findings: Yes 11/03/2021  7:44  PM  Visual Inspection No deformities, no ulcerations, no other skin breakdown bilaterally: Yes Sensation Testing Intact to touch and monofilament testing bilaterally: Yes Pulse Check Posterior Tibialis and Dorsalis pulse intact bilaterally: Yes Comments      Lab Results  Component Value Date   WBC 8.4 10/29/2021   HGB 13.8 10/29/2021   HCT 45.4 10/29/2021   PLT 266 10/29/2021   GLUCOSE 75 10/29/2021   CHOL 155 10/29/2021   TRIG 112 10/29/2021   HDL 58 10/29/2021   LDLCALC 77 10/29/2021   ALT 17 10/29/2021   AST 20 10/29/2021   NA 144 10/29/2021   K 4.1 10/29/2021   CL 105 10/29/2021   CREATININE 0.90 10/29/2021   BUN 10 10/29/2021   CO2 25 10/29/2021   HGBA1C 7.4 (H) 10/29/2021   MICROALBUR 30 07/21/2020      Assessment & Plan:   Problem List Items Addressed This Visit       Cardiovascular and Mediastinum   Atherosclerosis of aorta ([place]) Patient on crestor     Endocrine   Type 2 diabetes mellitus with other specified complication [facility]) An individual care plan for diabetes was established and reinforced today.  The patient's status was assessed using clinical findings on exam, labs and diagnostic testing. Patient success at meeting goals based on disease specific evidence-based guidelines and found to be good controlled. Medications were assessed and patient's understanding of the medical issues , including barriers were assessed. Recommend adherence to a diabetic diet, a graduated exercise program, HgbA1c level is checked quarterly, and urine microalbumin performed yearly .  Annual mono-filament sensation testing performed. Lower blood pressure and control hyperlipidemia is important. Get annual eye exams and annual flu shots and smoking cessation discussed.  Self management goals were  discussed.      Other   Mixed incontinence Patient has mixed incontinence, stable    Mixed hyperlipidemia - Primary AN INDIVIDUAL CARE PLAN for hyperlipidemia/ cholesterol was  established and reinforced today.  The patient's status was assessed using clinical findings on exam, lab and other diagnostic tests. The patient's disease status was assessed based on evidence-based guidelines and found to be well controlled. MEDICATIONS were reviewed. SELF MANAGEMENT GOALS have been discussed and patient's success at attaining the goal of low cholesterol was assessed. RECOMMENDATION given include regular exercise 3 days a week and low cholesterol/low fat diet. CLINICAL SUMMARY including written plan to identify barriers unique to the patient due to social or economic  reasons was discussed.     Depression, recurrent ([place]) Patient stable on remeron   Other Visit Diagnoses     Dysuria       Relevant Orders   POCT URINALYSIS DIP (CLIN[facility]) (Completed) Urinalysis is negative    Encounter for screening mammogram for malignant neoplasm of breast       Relevant Orders   MM Digital Screening     .    Orders Placed This Encounter  Procedures   MM Digital Screening   POCT URINALYSIS DIP (CLINITEK)     Follow-up: Return in about 3 months (around 02/03/2022).  An After Visit Summary was printed and given to the patient.  Reinaldo Meeker, MD [facility] [phone]

## 2021-11-12 DIAGNOSIS — I10 Essential (primary) hypertension: Secondary | ICD-10-CM

## 2021-11-12 DIAGNOSIS — Z794 Long term (current) use of insulin: Secondary | ICD-10-CM | POA: Diagnosis not present

## 2021-11-12 DIAGNOSIS — E1159 Type 2 diabetes mellitus with other circulatory complications: Secondary | ICD-10-CM

## 2021-11-12 DIAGNOSIS — E785 Hyperlipidemia, unspecified: Secondary | ICD-10-CM | POA: Diagnosis not present

## 2021-11-12 DIAGNOSIS — M810 Age-related osteoporosis without current pathological fracture: Secondary | ICD-10-CM

## 2021-11-13 DIAGNOSIS — R32 Unspecified urinary incontinence: Secondary | ICD-10-CM | POA: Diagnosis not present

## 2021-11-23 ENCOUNTER — Telehealth: Payer: Self-pay

## 2021-11-23 NOTE — Progress Notes (Signed)
Chronic Care Management Pharmacy Assistant   Name: Rebecca Hodge  MRN: [id number] DOB: [birth year]   Reason for Encounter: Disease State call for DM   Recent office visits:  11/03/21 Reinaldo Meeker MD. Seen for routine visit. No med changes.   10/20/21 Reinaldo Meeker MD. Orders Only. D/C Glimepiride 45m.  Recent consult visits:  None  Hospital visits:  None  Medications: Outpatient Encounter Medications as of 11/23/2021  Medication Sig   ACCU-CHEK GUIDE test strip 1 each by Other route daily. Check blood sugar fasting in the morning daily.   alendronate (FOSAMAX) 70 MG tablet TAKE 1 TABLET BY MOUTH WEEKLY ON THE SAME DAY EACH WEEK, WITH A FULL GLASS OF WATER AT LEAST 30 MINUTES BEFORE FIRST FOOD OR BEVERAGE OF THE DAY. REMAIN UPRIGHT FOR AT LEAST 30 MINUTES AFTER TAKING   BD PEN NEEDLE NANO U/F 32G X 4 MM MISC See admin instructions.   Blood Glucose Monitoring Suppl (ACCU-CHEK GUIDE) w/Device KIT 1 each by Other route daily.   estradiol (ESTRACE) 0.1 MG/GM vaginal cream INSERT 1 APPLICATORFUL VAGINALLY 3 TIMES A WEEK   ezetimibe (ZETIA) 10 MG tablet TAKE 1 TABLET(10 MG) BY MOUTH DAILY   gabapentin (NEURONTIN) 300 MG capsule Take 1 capsule (300 mg total) by mouth 3 (three) times daily.   insulin detemir (LEVEMIR FLEXTOUCH) 100 UNIT/ML FlexPen INJECT 12 UNITS SUBCUTANEOUSLY EVERY NIGHT AT BEDTIME   Lancets (ONETOUCH ULTRASOFT) lancets 1 each by Other route daily. Use as instructed   liraglutide (VICTOZA) 18 MG/3ML SOPN INJECT 0.2 MILLILITERS (1.2 MG TOTAL) UNDER THE SKIN DAILY.   metoprolol tartrate (LOPRESSOR) 25 MG tablet Take 1 tablet (25 mg total) by mouth 2 (two) times daily.   mirtazapine (REMERON) 15 MG tablet TAKE 1 TABLET(15 MG) BY MOUTH AT BEDTIME   nitrofurantoin (MACRODANTIN) 50 MG capsule TAKE 1 CAPSULE(50 MG) BY MOUTH AT BEDTIME   pantoprazole (PROTONIX) 40 MG tablet Take 1 tablet (40 mg total) by mouth daily.   rosuvastatin (CRESTOR) 40 MG tablet Take 1 tablet (40  mg total) by mouth at bedtime.   Vitamin D, Ergocalciferol, (DRISDOL) 1.25 MG (50000 UNIT) CAPS capsule Take 1 capsule (50,000 Units total) by mouth every 7 (seven) days.   No facility-administered encounter medications on file as of 11/23/2021.    Recent Relevant Labs: Lab Results  Component Value Date/Time   HGBA1C 7.4 (H) 10/29/2021 09:01 AM   HGBA1C 7.6 (H) 06/22/2021 09:55 AM   MICROALBUR 30 07/21/2020 11:11 AM   MICROALBUR 30 06/25/2019 09:02 AM    Kidney Function Lab Results  Component Value Date/Time   CREATININE 0.90 10/29/2021 09:01 AM   CREATININE 0.96 06/22/2021 09:55 AM   GFRNONAA 50 (L) 12/24/2019 11:04 AM   GFRAA 57 (L) 12/24/2019 11:04 AM     Current antihyperglycemic regimen:  Insulin Detemir 100u Inject 12 units at bedtime Liraglutide 169m3ml- INJECT 0.2 MILLILITERS (1.2 MG TOTAL) UNDER THE SKIN DAILY. Patient verbally confirms she is taking the above medications as directed. Yes  What recent interventions/DTPs have been made to improve glycemic control:  D/C the Glimepiride at office visit 10/20/21  Have there been any recent hospitalizations or ED visits since last visit with CPP? No  Patient denies hypoglycemic symptoms, including None  Patient denies hyperglycemic symptoms, including none  How often are you checking your blood sugar? twice daily before meals and medication  What are your blood sugars ranging?  Fasting: 150, 176, 186, 145, 148,149  On insulin? Yes How many units:12u  During the week, how often does your blood glucose drop below 70? Never  Are you checking your feet daily/regularly? Yes  Adherence Review: Is the patient currently on a STATIN medication? Yes Is the patient currently on ACE/ARB medication? No Does the patient have >5 day gap between last estimated fill dates? CPP to review  Care Gaps: Last eye exam / Retinopathy Screening? 03/19/20 Last Annual Wellness Visit? 11/12/20 Last Diabetic Foot Exam? 11/03/21   Star  Rating Drugs:  Medication:  Last Fill: Day Supply Liraglutide   10/13/21-07/28/21 [place], [place] Pharmacist Assistant  [phone]

## 2021-12-02 NOTE — Telephone Encounter (Signed)
Spoke with patient, will bump up to 14 units per PCP

## 2021-12-03 DIAGNOSIS — E1169 Type 2 diabetes mellitus with other specified complication: Secondary | ICD-10-CM | POA: Diagnosis not present

## 2021-12-09 ENCOUNTER — Inpatient Hospital Stay: Admission: RE | Admit: 2021-12-09 | Payer: Medicare HMO | Source: Ambulatory Visit

## 2021-12-14 ENCOUNTER — Other Ambulatory Visit: Payer: Self-pay | Admitting: Legal Medicine

## 2021-12-14 DIAGNOSIS — F339 Major depressive disorder, recurrent, unspecified: Secondary | ICD-10-CM

## 2021-12-15 DIAGNOSIS — R32 Unspecified urinary incontinence: Secondary | ICD-10-CM | POA: Diagnosis not present

## 2021-12-16 ENCOUNTER — Other Ambulatory Visit: Payer: Self-pay

## 2021-12-16 DIAGNOSIS — E782 Mixed hyperlipidemia: Secondary | ICD-10-CM

## 2021-12-16 DIAGNOSIS — M810 Age-related osteoporosis without current pathological fracture: Secondary | ICD-10-CM

## 2021-12-16 DIAGNOSIS — R1084 Generalized abdominal pain: Secondary | ICD-10-CM

## 2021-12-16 DIAGNOSIS — E1169 Type 2 diabetes mellitus with other specified complication: Secondary | ICD-10-CM

## 2021-12-16 DIAGNOSIS — E559 Vitamin D deficiency, unspecified: Secondary | ICD-10-CM

## 2021-12-16 DIAGNOSIS — I152 Hypertension secondary to endocrine disorders: Secondary | ICD-10-CM

## 2021-12-16 MED ORDER — PANTOPRAZOLE SODIUM 40 MG PO TBEC: 40.0000 mg | DELAYED_RELEASE_TABLET | Freq: Every day | ORAL | 2 refills | Status: DC

## 2021-12-16 MED ORDER — EZETIMIBE 10 MG PO TABS: ORAL_TABLET | ORAL | 2 refills | Status: DC

## 2021-12-16 MED ORDER — METOPROLOL TARTRATE 25 MG PO TABS: 25.0000 mg | ORAL_TABLET | Freq: Two times a day (BID) | ORAL | 2 refills | Status: DC

## 2021-12-16 MED ORDER — ROSUVASTATIN CALCIUM 40 MG PO TABS: 40.0000 mg | ORAL_TABLET | Freq: Every day | ORAL | 2 refills | Status: DC

## 2021-12-16 MED ORDER — GABAPENTIN 300 MG PO CAPS: 300.0000 mg | ORAL_CAPSULE | Freq: Three times a day (TID) | ORAL | 2 refills | Status: DC

## 2021-12-16 MED ORDER — VITAMIN D (ERGOCALCIFEROL) 1.25 MG (50000 UNIT) PO CAPS: 50000.0000 [IU] | ORAL_CAPSULE | ORAL | 6 refills | Status: DC

## 2021-12-16 MED ORDER — VICTOZA 18 MG/3ML ~~LOC~~ SOPN: PEN_INJECTOR | SUBCUTANEOUS | 6 refills | Status: DC

## 2021-12-16 MED ORDER — ALENDRONATE SODIUM 70 MG PO TABS: ORAL_TABLET | ORAL | 1 refills | Status: DC

## 2021-12-25 ENCOUNTER — Telehealth: Payer: Self-pay

## 2021-12-25 NOTE — Progress Notes (Signed)
Chronic Care Management Pharmacy Assistant   Name: Rebecca Hodge  MRN: [id number] DOB: April 01, 1940   Reason for Encounter: Disease State call for DM   Recent office visits:  None  Recent consult visits:  None  Hospital visits:  None  Medications: Outpatient Encounter Medications as of 12/25/2021  Medication Sig   ACCU-CHEK GUIDE test strip 1 each by Other route daily. Check blood sugar fasting in the morning daily.   alendronate (FOSAMAX) 70 MG tablet TAKE 1 TABLET BY MOUTH WEEKLY ON THE SAME DAY EACH WEEK, WITH A FULL GLASS OF WATER AT LEAST 30 MINUTES BEFORE FIRST FOOD OR BEVERAGE OF THE DAY. REMAIN UPRIGHT FOR AT LEAST 30 MINUTES AFTER TAKING   BD PEN NEEDLE NANO U/F 32G X 4 MM MISC See admin instructions.   Blood Glucose Monitoring Suppl (ACCU-CHEK GUIDE) w/Device KIT 1 each by Other route daily.   estradiol (ESTRACE) 0.1 MG/GM vaginal cream INSERT 1 APPLICATORFUL VAGINALLY 3 TIMES A WEEK   ezetimibe (ZETIA) 10 MG tablet TAKE 1 TABLET(10 MG) BY MOUTH DAILY   gabapentin (NEURONTIN) 300 MG capsule Take 1 capsule (300 mg total) by mouth 3 (three) times daily.   insulin detemir (LEVEMIR FLEXTOUCH) 100 UNIT/ML FlexPen INJECT 12 UNITS SUBCUTANEOUSLY EVERY NIGHT AT BEDTIME (Patient taking differently: INJECT 14 UNITS SUBCUTANEOUSLY EVERY NIGHT AT BEDTIME)   Lancets (ONETOUCH ULTRASOFT) lancets 1 each by Other route daily. Use as instructed   liraglutide (VICTOZA) 18 MG/3ML SOPN INJECT 0.2 MILLILITERS (1.2 MG TOTAL) UNDER THE SKIN DAILY.   metoprolol tartrate (LOPRESSOR) 25 MG tablet Take 1 tablet (25 mg total) by mouth 2 (two) times daily.   mirtazapine (REMERON) 15 MG tablet TAKE 1 TABLET(15 MG) BY MOUTH AT BEDTIME   nitrofurantoin (MACRODANTIN) 50 MG capsule TAKE 1 CAPSULE(50 MG) BY MOUTH AT BEDTIME   pantoprazole (PROTONIX) 40 MG tablet Take 1 tablet (40 mg total) by mouth daily.   rosuvastatin (CRESTOR) 40 MG tablet Take 1 tablet (40 mg total) by mouth at bedtime.   Vitamin  D, Ergocalciferol, (DRISDOL) 1.25 MG (50000 UNIT) CAPS capsule Take 1 capsule (50,000 Units total) by mouth every 7 (seven) days.   No facility-administered encounter medications on file as of 12/25/2021.    Recent Relevant Labs: Lab Results  Component Value Date/Time   HGBA1C 7.4 (H) 10/29/2021 09:01 AM   HGBA1C 7.6 (H) 06/22/2021 09:55 AM   MICROALBUR 30 07/21/2020 11:11 AM   MICROALBUR 30 06/25/2019 09:02 AM    Kidney Function Lab Results  Component Value Date/Time   CREATININE 0.90 10/29/2021 09:01 AM   CREATININE 0.96 06/22/2021 09:55 AM   GFRNONAA 50 (L) 12/24/2019 11:04 AM   GFRAA 57 (L) 12/24/2019 11:04 AM     Current antihyperglycemic regimen:  Levemir Flextouch 100 u- Inject 14 units at bedtime (Pt is still taking 12 units) Victoza 72m/3ml Inject 0.294mdaily  Patient verbally confirms she is taking the above medications as directed. Yes  What recent interventions/DTPs have been made to improve glycemic control:  Pt used the 14 units of insulin for 3 days and could not handle how she felt. It made her feel nervous/anxious she checked her sugars and it went up to 156 and stopped and went back down to 12 units. She stated when her sugars are up she does not feel good but when they are lower she feels her best.   Have there been any recent hospitalizations or ED visits since last visit with CPP? No  Patient reports  hypoglycemic symptoms, including Nervous/irritable  Patient denies hyperglycemic symptoms, including none  How often are you checking your blood sugar? in the morning before eating or drinking  What are your blood sugars ranging?  Fasting: 12/21/21 131, 12/22/21 145, 12/23/21 132 12/24/21 154 12/25/21 51  On insulin? Yes How many units:12 u  During the week, how often does your blood glucose drop below 70? Once a week  Are you checking your feet daily/regularly? Yes  Adherence Review: Is the patient currently on a STATIN medication? Yes Is the  patient currently on ACE/ARB medication? No Does the patient have >5 day gap between last estimated fill dates? Yes, pt stated she has never been without her Victoza and does not know whey there is a month gap.  Care Gaps: Last eye exam / Retinopathy Screening? 03/19/20 Last Annual Wellness Visit? 11/12/20 Last Diabetic Foot Exam? 11/03/21   Star Rating Drugs:  Medication:  Last Fill: Day Supply Victoza   12/16/21-10/13/21 [place], [place] Pharmacist Assistant  [phone]

## 2022-01-02 DIAGNOSIS — E1169 Type 2 diabetes mellitus with other specified complication: Secondary | ICD-10-CM | POA: Diagnosis not present

## 2022-01-12 NOTE — Telephone Encounter (Signed)
Patient had very low sugar. States first time in 4 months. Is not sure why. I counseled her to schedule an appt with [place] ASAP (No f/u scheduled) and to bring in all sugar logs of past few months

## 2022-01-14 ENCOUNTER — Other Ambulatory Visit: Payer: Self-pay | Admitting: Legal Medicine

## 2022-01-14 DIAGNOSIS — F339 Major depressive disorder, recurrent, unspecified: Secondary | ICD-10-CM

## 2022-01-20 ENCOUNTER — Encounter: Payer: Self-pay | Admitting: Legal Medicine

## 2022-01-20 ENCOUNTER — Ambulatory Visit (INDEPENDENT_AMBULATORY_CARE_PROVIDER_SITE_OTHER): Payer: Medicare HMO | Admitting: Legal Medicine

## 2022-01-20 VITALS — BP 130/60 | HR 43 | Temp 97.9°F | Resp 15 | Ht 64.0 in | Wt 130.0 lb

## 2022-01-20 DIAGNOSIS — E1169 Type 2 diabetes mellitus with other specified complication: Secondary | ICD-10-CM

## 2022-01-20 DIAGNOSIS — Z794 Long term (current) use of insulin: Secondary | ICD-10-CM | POA: Diagnosis not present

## 2022-01-20 DIAGNOSIS — R32 Unspecified urinary incontinence: Secondary | ICD-10-CM | POA: Diagnosis not present

## 2022-01-20 DIAGNOSIS — I700 Atherosclerosis of aorta: Secondary | ICD-10-CM

## 2022-01-20 DIAGNOSIS — Z6822 Body mass index (BMI) 22.0-22.9, adult: Secondary | ICD-10-CM

## 2022-01-20 DIAGNOSIS — M810 Age-related osteoporosis without current pathological fracture: Secondary | ICD-10-CM

## 2022-01-20 DIAGNOSIS — E782 Mixed hyperlipidemia: Secondary | ICD-10-CM

## 2022-01-20 NOTE — Progress Notes (Addendum)
Subjective:  Patient ID: Rebecca Hodge, female    DOB: [birth year]  Age: 81 y.o. MRN: [id number]  Chief Complaint  Patient presents with   Hyperlipidemia   Diabetes    HPI: chronic   Patient present with type 2 diabetes.  Compliance with treatment has been good; patient take medicines as directed, maintains diet and exercise regimen, follows up as directed, and is keeping glucose diary.  Current medicines for diabetes Levemir 12 Units every night at bedtime, Victoza 1.2 mg daily. Patient performs foot exams daily and last ophthalmologic exam was 03/19/2020. Last A1C was 7.4%. Staying on diet.  Needs to drink more. Patient is using her CGM to monitor her glucose.  Patient presents with hyperlipidemia.  Compliance with treatment has been good; patient takes medicines as directed, maintains low cholesterol diet, follows up as directed, and maintains exercise regimen.  Patient is using Rosuvastatin 40 mg daily, Zetia 10 mg daily without problems.   GERD: Taking Pantoprazole 40 mg daily.  Osteoporosis: She takes Fosamax 70 mg daily. Current Outpatient Medications on File Prior to Visit  Medication Sig Dispense Refill   ACCU-CHEK GUIDE test strip 1 each by Other route daily. Check blood sugar fasting in the morning daily. 100 each 2   alendronate (FOSAMAX) 70 MG tablet TAKE 1 TABLET BY MOUTH WEEKLY ON THE SAME DAY EACH WEEK, WITH A FULL GLASS OF WATER AT LEAST 30 MINUTES BEFORE FIRST FOOD OR BEVERAGE OF THE DAY. REMAIN UPRIGHT FOR AT LEAST 30 MINUTES AFTER TAKING 12 tablet 1   BD PEN NEEDLE NANO U/F 32G X 4 MM MISC See admin instructions.     Blood Glucose Monitoring Suppl (ACCU-CHEK GUIDE) w/Device KIT 1 each by Other route daily. 1 kit 0   estradiol (ESTRACE) 0.1 MG/GM vaginal cream INSERT 1 APPLICATORFUL VAGINALLY 3 TIMES A WEEK 42.5 g 3   ezetimibe (ZETIA) 10 MG tablet TAKE 1 TABLET(10 MG) BY MOUTH DAILY 90 tablet 2   gabapentin (NEURONTIN) 300 MG capsule Take 1 capsule (300 mg total) by  mouth 3 (three) times daily. 270 capsule 2   glimepiride (AMARYL) 1 MG tablet Take 1 mg by mouth daily.     insulin detemir (LEVEMIR FLEXTOUCH) 100 UNIT/ML FlexPen INJECT 12 UNITS SUBCUTANEOUSLY EVERY NIGHT AT BEDTIME (Patient taking differently: INJECT 14 UNITS SUBCUTANEOUSLY EVERY NIGHT AT BEDTIME) 15 mL 2   Lancets (ONETOUCH ULTRASOFT) lancets 1 each by Other route daily. Use as instructed 100 each 2   liraglutide (VICTOZA) 18 MG/3ML SOPN INJECT 0.2 MILLILITERS (1.2 MG TOTAL) UNDER THE SKIN DAILY. 6 mL 6   lisinopril (ZESTRIL) 20 MG tablet Take 20 mg by mouth daily.     metoprolol tartrate (LOPRESSOR) 25 MG tablet Take 1 tablet (25 mg total) by mouth 2 (two) times daily. 180 tablet 2   mirtazapine (REMERON) 15 MG tablet TAKE 1 TABLET(15 MG) BY MOUTH AT BEDTIME 30 tablet 3   nitrofurantoin (MACRODANTIN) 50 MG capsule TAKE 1 CAPSULE(50 MG) BY MOUTH AT BEDTIME 90 capsule 2   pantoprazole (PROTONIX) 40 MG tablet Take 1 tablet (40 mg total) by mouth daily. 90 tablet 2   rosuvastatin (CRESTOR) 40 MG tablet Take 1 tablet (40 mg total) by mouth at bedtime. 90 tablet 2   Vitamin D, Ergocalciferol, (DRISDOL) 1.25 MG (50000 UNIT) CAPS capsule Take 1 capsule (50,000 Units total) by mouth every 7 (seven) days. 12 capsule 6   No current facility-administered medications on file prior to visit.   Past Medical History:  Diagnosis Date   Age-related osteoporosis without current pathological fracture 06/18/2019   Allergic rhinitis    BMI 27.0-27.9,adult 08/17/2019   Diabetes ([place])    Dysphasia following cerebral infarction 06/18/2019   Hyperlipidemia    Hypertension    Mixed hyperlipidemia 06/18/2019   Mixed incontinence 06/18/2019   Sequelae of poliomyelitis 06/18/2019   Type 2 diabetes mellitus with other specified complication ([place]) 06/19/6544   Past Surgical History:  Procedure Laterality Date   ABDOMINAL HYSTERECTOMY     CATARACT EXTRACTION Right    CHOLECYSTECTOMY     ROTATOR CUFF REPAIR Bilateral      History reviewed. No pertinent family history. Social History   Socioeconomic History   Marital status: Widowed    Spouse name: Not on file   Number of children: Not on file   Years of education: Not on file   Highest education level: Not on file  Occupational History   Occupation: Disabled  Tobacco Use   Smoking status: Former    Packs/day: 1.00    Years: 5.00    Total pack years: 5.00    Types: Cigarettes    Quit date: 03/16/1987    Years since quitting: 34.8   Smokeless tobacco: Never  Substance and Sexual Activity   Alcohol use: No   Drug use: No   Sexual activity: Not Currently  Other Topics Concern   Not on file  Social History Narrative   Not on file   Social Determinants of Health   Financial Resource Strain: Low Risk  (10/20/2021)   Overall Financial Resource Strain (CARDIA)    Difficulty of Paying Living Expenses: Not hard at all  Food Insecurity: Not on file  Transportation Needs: No Transportation Needs (10/20/2021)   PRAPARE - Hydrologist (Medical): No    Lack of Transportation (Non-Medical): No  Physical Activity: Not on file  Stress: Not on file  Social Connections: Not on file    Review of Systems  Constitutional:  Negative for chills, fatigue and fever.  HENT:  Negative for congestion, ear pain and sore throat.   Respiratory:  Negative for cough and shortness of breath.   Cardiovascular:  Negative for chest pain and palpitations.  Gastrointestinal:  Negative for abdominal pain, constipation, diarrhea, nausea and vomiting.  Endocrine: Negative for polydipsia, polyphagia and polyuria.  Genitourinary:  Negative for difficulty urinating and dysuria.  Musculoskeletal:  Negative for arthralgias, back pain and myalgias.  Skin:  Negative for rash.  Neurological:  Negative for headaches.  Psychiatric/Behavioral:  Negative for dysphoric mood. The patient is not nervous/anxious.      Objective:  BP 130/60   Pulse (!) 43    Temp 97.9 F (36.6 C)   Resp 15   Ht _0  (1.626 m)   Wt 130 lb (59 kg)   LMP  (LMP Unknown)   SpO2 96%   BMI 22.31 kg/m      01/20/2022   10:12 AM 11/03/2021    2:23 PM 09/07/2021   10:10 AM  BP/Weight  Systolic BP 503 546 568  Diastolic BP 60 80 70  Wt. (Lbs) 130 135 134  BMI 22.31 kg/m2 23.17 kg/m2 23 kg/m2    Physical Exam Vitals reviewed.  Constitutional:      General: She is not in acute distress.    Appearance: Normal appearance.  HENT:     Head: Normocephalic.     Right Ear: Tympanic membrane normal.     Left Ear: Tympanic membrane  normal.     Nose: Nose normal.     Mouth/Throat:     Mouth: Mucous membranes are moist.     Pharynx: Oropharynx is clear.  Eyes:     Extraocular Movements: Extraocular movements intact.     Conjunctiva/sclera: Conjunctivae normal.     Pupils: Pupils are equal, round, and reactive to light.  Cardiovascular:     Rate and Rhythm: Normal rate and regular rhythm.     Pulses: Normal pulses.     Heart sounds: Normal heart sounds. No murmur heard.    No gallop.  Pulmonary:     Effort: Pulmonary effort is normal. No respiratory distress.     Breath sounds: Normal breath sounds. No wheezing.  Abdominal:     General: Abdomen is flat. Bowel sounds are normal. There is no distension.     Tenderness: There is no abdominal tenderness.  Musculoskeletal:     Left shoulder: Tenderness and crepitus present. Decreased range of motion.     Cervical back: Normal range of motion and neck supple.  Skin:    General: Skin is warm.     Capillary Refill: Capillary refill takes less than 2 seconds.  Neurological:     General: No focal deficit present.     Mental Status: She is alert and oriented to person, place, and time. Mental status is at baseline.     Diabetic Foot Exam - Simple   Simple Foot Form Diabetic Foot exam was performed with the following findings: Yes 01/20/2022 10:49 AM  Visual Inspection No deformities, no ulcerations, no other  skin breakdown bilaterally: Yes Sensation Testing Intact to touch and monofilament testing bilaterally: Yes Pulse Check Posterior Tibialis and Dorsalis pulse intact bilaterally: Yes Comments      Lab Results  Component Value Date   WBC 8.4 10/29/2021   HGB 13.8 10/29/2021   HCT 45.4 10/29/2021   PLT 266 10/29/2021   GLUCOSE 75 10/29/2021   CHOL 155 10/29/2021   TRIG 112 10/29/2021   HDL 58 10/29/2021   LDLCALC 77 10/29/2021   ALT 17 10/29/2021   AST 20 10/29/2021   NA 144 10/29/2021   K 4.1 10/29/2021   CL 105 10/29/2021   CREATININE 0.90 10/29/2021   BUN 10 10/29/2021   CO2 25 10/29/2021   HGBA1C 7.4 (H) 10/29/2021   MICROALBUR 30 07/21/2020      Assessment & Plan:   Problem List Items Addressed This Visit       Cardiovascular and Mediastinum   Atherosclerosis of aorta (HCC)   Relevant Medications   lisinopril (ZESTRIL) 20 MG tablet Patient is on crestor     Endocrine   Type 2 diabetes mellitus with other specified complication ([place]) - Primary   Relevant Medications   glimepiride (AMARYL) 1 MG tablet   lisinopril (ZESTRIL) 20 MG tablet   Other Relevant Orders   Comprehensive metabolic panel   Hemoglobin A1c   CBC with Differential/Platelet An individual care plan for diabetes was established and reinforced today.  The patient's status was assessed using clinical findings on exam, labs and diagnostic testing. Patient success at meeting goals based on disease specific evidence-based guidelines and found to be good controlled. Medications were assessed and patient's understanding of the medical issues , including barriers were assessed. Recommend adherence to a diabetic diet, a graduated exercise program, HgbA1c level is checked quarterly, and urine microalbumin performed yearly .  Annual mono-filament sensation testing performed. Lower blood pressure and control hyperlipidemia is important. Get annual  eye exams and annual flu shots and smoking cessation  discussed.  Self management goals were discussed.      Musculoskeletal and Integument   Age-related osteoporosis without current pathological fracture Patient is on alendronate and caclium     Other   Mixed hyperlipidemia   Relevant Medications   lisinopril (ZESTRIL) 20 MG tablet   Other Relevant Orders   Lipid panel AN INDIVIDUAL CARE PLAN for hyperlipidemia/ cholesterol was established and reinforced today.  The patient's status was assessed using clinical findings on exam, lab and other diagnostic tests. The patient's disease status was assessed based on evidence-based guidelines and found to be fair controlled. MEDICATIONS were reviewed. SELF MANAGEMENT GOALS have been discussed and patient's success at attaining the goal of low cholesterol was assessed. RECOMMENDATION given include regular exercise 3 days a week and low cholesterol/low fat diet. CLINICAL SUMMARY including written plan to identify barriers unique to the patient due to social or economic  reasons was discussed.    BMI 22.0-22.9, adult We discussed weight loss and need for protein/calorie supplements  .    Orders Placed This Encounter  Procedures   Comprehensive metabolic panel   Hemoglobin A1c   Lipid panel   CBC with Differential/Platelet     Follow-up: Return in about 3 months (around 04/22/2022).  An After Visit Summary was printed and given to the patient.  Reinaldo Meeker, MD [facility] [phone]

## 2022-01-20 NOTE — Patient Instructions (Signed)
Drink ensure or boost twice a day to increase protein intake

## 2022-01-25 ENCOUNTER — Telehealth: Payer: Self-pay

## 2022-01-25 NOTE — Progress Notes (Signed)
Chronic Care Management Pharmacy Assistant   Name: LUCIANNA OSTLUND  MRN: [id number] DOB: 07/26/1940    Reason for Encounter: Disease State call for DM   Recent office visits:  01/20/22 Reinaldo Meeker MD. Seen for routine visit. No med changes.  Recent consult visits:  None  Hospital visits:  None  Medications: Outpatient Encounter Medications as of 01/25/2022  Medication Sig   ACCU-CHEK GUIDE test strip 1 each by Other route daily. Check blood sugar fasting in the morning daily.   alendronate (FOSAMAX) 70 MG tablet TAKE 1 TABLET BY MOUTH WEEKLY ON THE SAME DAY EACH WEEK, WITH A FULL GLASS OF WATER AT LEAST 30 MINUTES BEFORE FIRST FOOD OR BEVERAGE OF THE DAY. REMAIN UPRIGHT FOR AT LEAST 30 MINUTES AFTER TAKING   BD PEN NEEDLE NANO U/F 32G X 4 MM MISC See admin instructions.   Blood Glucose Monitoring Suppl (ACCU-CHEK GUIDE) w/Device KIT 1 each by Other route daily.   estradiol (ESTRACE) 0.1 MG/GM vaginal cream INSERT 1 APPLICATORFUL VAGINALLY 3 TIMES A WEEK   ezetimibe (ZETIA) 10 MG tablet TAKE 1 TABLET(10 MG) BY MOUTH DAILY   gabapentin (NEURONTIN) 300 MG capsule Take 1 capsule (300 mg total) by mouth 3 (three) times daily.   glimepiride (AMARYL) 1 MG tablet Take 1 mg by mouth daily.   insulin detemir (LEVEMIR FLEXTOUCH) 100 UNIT/ML FlexPen INJECT 12 UNITS SUBCUTANEOUSLY EVERY NIGHT AT BEDTIME (Patient taking differently: INJECT 14 UNITS SUBCUTANEOUSLY EVERY NIGHT AT BEDTIME)   Lancets (ONETOUCH ULTRASOFT) lancets 1 each by Other route daily. Use as instructed   liraglutide (VICTOZA) 18 MG/3ML SOPN INJECT 0.2 MILLILITERS (1.2 MG TOTAL) UNDER THE SKIN DAILY.   lisinopril (ZESTRIL) 20 MG tablet Take 20 mg by mouth daily.   metoprolol tartrate (LOPRESSOR) 25 MG tablet Take 1 tablet (25 mg total) by mouth 2 (two) times daily.   mirtazapine (REMERON) 15 MG tablet TAKE 1 TABLET(15 MG) BY MOUTH AT BEDTIME   nitrofurantoin (MACRODANTIN) 50 MG capsule TAKE 1 CAPSULE(50 MG) BY MOUTH AT  BEDTIME   pantoprazole (PROTONIX) 40 MG tablet Take 1 tablet (40 mg total) by mouth daily.   rosuvastatin (CRESTOR) 40 MG tablet Take 1 tablet (40 mg total) by mouth at bedtime.   Vitamin D, Ergocalciferol, (DRISDOL) 1.25 MG (50000 UNIT) CAPS capsule Take 1 capsule (50,000 Units total) by mouth every 7 (seven) days.   No facility-administered encounter medications on file as of 01/25/2022.    Recent Relevant Labs: Lab Results  Component Value Date/Time   HGBA1C 7.4 (H) 10/29/2021 09:01 AM   HGBA1C 7.6 (H) 06/22/2021 09:55 AM   MICROALBUR 30 07/21/2020 11:11 AM   MICROALBUR 30 06/25/2019 09:02 AM    Kidney Function Lab Results  Component Value Date/Time   CREATININE 0.90 10/29/2021 09:01 AM   CREATININE 0.96 06/22/2021 09:55 AM   GFRNONAA 50 (L) 12/24/2019 11:04 AM   GFRAA 57 (L) 12/24/2019 11:04 AM     Current antihyperglycemic regimen:  Levemir Flextouch 100 u- Inject 14 units at bedtime (Pt is still taking 12 units) Victoza 100m/3ml Inject 0.274mdaily  Patient verbally confirms she is taking the above medications as directed. Yes  What recent interventions/DTPs have been made to improve glycemic control:  No changes   Have there been any recent hospitalizations or ED visits since last visit with CPP? No  Patient denies hypoglycemic symptoms, including None  Patient reports hyperglycemic symptoms, including fatigue  How often are you checking your blood sugar? in the morning  before eating or drinking  What are your blood sugars ranging?  Fasting: 01/19/22 182 01/20/22 126, 01/21/22 112, 01/22/22 172, 01/23/22 235, 01/24/22 132  (Pt stated she ate sugary foods the night before the high readings)  On insulin? Yes How many units:12 u  During the week, how often does your blood glucose drop below 70? Never  Are you checking your feet daily/regularly? Yes  Adherence Review: Is the patient currently on a STATIN medication? Yes Is the patient currently on ACE/ARB  medication? Yes Does the patient have >5 day gap between last estimated fill dates? CPP to review  Care Gaps: Last eye exam / Retinopathy Screening? 03/19/20 Last Annual Wellness Visit? 11/12/20 Last Diabetic Foot Exam? 01/20/22   Star Rating Drugs:  Medication:  Last Fill: Day Supply Victoza                        01/13/22-12/16/21 [place], [place] Pharmacist Assistant  [phone]

## 2022-02-01 DIAGNOSIS — E1169 Type 2 diabetes mellitus with other specified complication: Secondary | ICD-10-CM | POA: Diagnosis not present

## 2022-02-08 ENCOUNTER — Other Ambulatory Visit: Payer: Self-pay

## 2022-02-08 DIAGNOSIS — Z794 Long term (current) use of insulin: Secondary | ICD-10-CM

## 2022-02-09 ENCOUNTER — Other Ambulatory Visit: Payer: Self-pay

## 2022-02-10 ENCOUNTER — Inpatient Hospital Stay: Admission: RE | Admit: 2022-02-10 | Payer: Medicare HMO | Source: Ambulatory Visit

## 2022-02-11 ENCOUNTER — Other Ambulatory Visit: Payer: Self-pay

## 2022-02-11 DIAGNOSIS — E1169 Type 2 diabetes mellitus with other specified complication: Secondary | ICD-10-CM

## 2022-02-11 MED ORDER — LEVEMIR FLEXTOUCH 100 UNIT/ML ~~LOC~~ SOPN: PEN_INJECTOR | SUBCUTANEOUS | 2 refills | Status: DC

## 2022-02-12 DIAGNOSIS — R32 Unspecified urinary incontinence: Secondary | ICD-10-CM | POA: Diagnosis not present

## 2022-02-17 ENCOUNTER — Ambulatory Visit: Payer: Medicare HMO | Admitting: Legal Medicine

## 2022-02-23 ENCOUNTER — Encounter: Payer: Self-pay | Admitting: Legal Medicine

## 2022-02-23 ENCOUNTER — Ambulatory Visit (INDEPENDENT_AMBULATORY_CARE_PROVIDER_SITE_OTHER): Payer: Medicare HMO | Admitting: Legal Medicine

## 2022-02-23 VITALS — BP 94/60 | HR 60 | Temp 97.3°F | Ht 64.0 in | Wt 134.0 lb

## 2022-02-23 DIAGNOSIS — R079 Chest pain, unspecified: Secondary | ICD-10-CM | POA: Diagnosis not present

## 2022-02-23 DIAGNOSIS — K449 Diaphragmatic hernia without obstruction or gangrene: Secondary | ICD-10-CM | POA: Diagnosis not present

## 2022-02-23 DIAGNOSIS — L739 Follicular disorder, unspecified: Secondary | ICD-10-CM | POA: Diagnosis not present

## 2022-02-23 DIAGNOSIS — E1169 Type 2 diabetes mellitus with other specified complication: Secondary | ICD-10-CM | POA: Diagnosis not present

## 2022-02-23 DIAGNOSIS — Z794 Long term (current) use of insulin: Secondary | ICD-10-CM | POA: Diagnosis not present

## 2022-02-23 DIAGNOSIS — R0789 Other chest pain: Secondary | ICD-10-CM | POA: Insufficient documentation

## 2022-02-23 DIAGNOSIS — R300 Dysuria: Secondary | ICD-10-CM

## 2022-02-23 LAB — POCT URINALYSIS DIP (CLINITEK)
Bilirubin, UA: NEGATIVE
Blood, UA: NEGATIVE
Glucose, UA: NEGATIVE mg/dL
Ketones, POC UA: NEGATIVE mg/dL
Nitrite, UA: NEGATIVE
Spec Grav, UA: 1.015 (ref 1.010–1.025)
Urobilinogen, UA: 0.2 E.U./dL
pH, UA: 6 (ref 5.0–8.0)

## 2022-02-23 MED ORDER — MUPIROCIN 2 % EX OINT: 1.0000 | TOPICAL_OINTMENT | Freq: Two times a day (BID) | CUTANEOUS | 2 refills | Status: DC

## 2022-02-23 MED ORDER — TRAMADOL HCL 50 MG PO TABS: 50.0000 mg | ORAL_TABLET | Freq: Three times a day (TID) | ORAL | 0 refills | Status: AC | PRN

## 2022-02-23 MED ORDER — LEVEMIR FLEXTOUCH 100 UNIT/ML ~~LOC~~ SOPN: 12.0000 [IU] | PEN_INJECTOR | Freq: Every day | SUBCUTANEOUS | 2 refills | Status: DC

## 2022-02-23 NOTE — Progress Notes (Signed)
+  Acute Office Visit  Subjective:    Patient ID: Rebecca Hodge, female    DOB: 02-Mar-1941, 81 y.o.   MRN: [id number]  Chief Complaint  Patient presents with   Cellulitis    HPI: Patient is in today for cellulitis on right sided of her face since one week ago. She tried neosporin and it did not help.  She also has a tender area under the left armpit since 2 weeks ago. She has costochondritis left 4th radiates, to shoulder  Past Medical History:  Diagnosis Date   Age-related osteoporosis without current pathological fracture 06/18/2019   Allergic rhinitis    BMI 27.0-27.9,adult 08/17/2019   Diabetes ([place])    Dysphasia following cerebral infarction 06/18/2019   Hyperlipidemia    Hypertension    Mixed hyperlipidemia 06/18/2019   Mixed incontinence 06/18/2019   Sequelae of poliomyelitis 06/18/2019   Type 2 diabetes mellitus with other specified complication ([place]) 0/10/6576    Past Surgical History:  Procedure Laterality Date   ABDOMINAL HYSTERECTOMY     CATARACT EXTRACTION Right    CHOLECYSTECTOMY     ROTATOR CUFF REPAIR Bilateral     History reviewed. No pertinent family history.  Social History   Socioeconomic History   Marital status: Widowed    Spouse name: Not on file   Number of children: Not on file   Years of education: Not on file   Highest education level: Not on file  Occupational History   Occupation: Disabled  Tobacco Use   Smoking status: Former    Packs/day: 1.00    Years: 5.00    Total pack years: 5.00    Types: Cigarettes    Quit date: 03/16/1987    Years since quitting: 34.9   Smokeless tobacco: Never  Substance and Sexual Activity   Alcohol use: No   Drug use: No   Sexual activity: Not Currently  Other Topics Concern   Not on file  Social History Narrative   Not on file   Social Determinants of Health   Financial Resource Strain: Low Risk  (10/20/2021)   Overall Financial Resource Strain (CARDIA)    Difficulty of Paying Living Expenses: Not  hard at all  Food Insecurity: Not on file  Transportation Needs: No Transportation Needs (10/20/2021)   PRAPARE - Hydrologist (Medical): No    Lack of Transportation (Non-Medical): No  Physical Activity: Not on file  Stress: Not on file  Social Connections: Not on file  Intimate Partner Violence: Not on file    Outpatient Medications Prior to Visit  Medication Sig Dispense Refill   ACCU-CHEK GUIDE test strip 1 each by Other route daily. Check blood sugar fasting in the morning daily. 100 each 2   alendronate (FOSAMAX) 70 MG tablet TAKE 1 TABLET BY MOUTH WEEKLY ON THE SAME DAY EACH WEEK, WITH A FULL GLASS OF WATER AT LEAST 30 MINUTES BEFORE FIRST FOOD OR BEVERAGE OF THE DAY. REMAIN UPRIGHT FOR AT LEAST 30 MINUTES AFTER TAKING 12 tablet 1   BD PEN NEEDLE NANO U/F 32G X 4 MM MISC See admin instructions.     Blood Glucose Monitoring Suppl (ACCU-CHEK GUIDE) w/Device KIT 1 each by Other route daily. 1 kit 0   estradiol (ESTRACE) 0.1 MG/GM vaginal cream INSERT 1 APPLICATORFUL VAGINALLY 3 TIMES A WEEK 42.5 g 3   ezetimibe (ZETIA) 10 MG tablet TAKE 1 TABLET(10 MG) BY MOUTH DAILY 90 tablet 2   gabapentin (NEURONTIN) 300 MG capsule  Take 1 capsule (300 mg total) by mouth 3 (three) times daily. 270 capsule 2   glimepiride (AMARYL) 1 MG tablet Take 1 mg by mouth daily.     Lancets (ONETOUCH ULTRASOFT) lancets 1 each by Other route daily. Use as instructed 100 each 2   liraglutide (VICTOZA) 18 MG/3ML SOPN INJECT 0.2 MILLILITERS (1.2 MG TOTAL) UNDER THE SKIN DAILY. 6 mL 6   lisinopril (ZESTRIL) 20 MG tablet Take 20 mg by mouth daily.     metoprolol tartrate (LOPRESSOR) 25 MG tablet Take 1 tablet (25 mg total) by mouth 2 (two) times daily. 180 tablet 2   mirtazapine (REMERON) 15 MG tablet TAKE 1 TABLET(15 MG) BY MOUTH AT BEDTIME 30 tablet 3   nitrofurantoin (MACRODANTIN) 50 MG capsule TAKE 1 CAPSULE(50 MG) BY MOUTH AT BEDTIME 90 capsule 2   pantoprazole (PROTONIX) 40 MG tablet  Take 1 tablet (40 mg total) by mouth daily. 90 tablet 2   rosuvastatin (CRESTOR) 40 MG tablet Take 1 tablet (40 mg total) by mouth at bedtime. 90 tablet 2   Vitamin D, Ergocalciferol, (DRISDOL) 1.25 MG (50000 UNIT) CAPS capsule Take 1 capsule (50,000 Units total) by mouth every 7 (seven) days. 12 capsule 6   insulin detemir (LEVEMIR FLEXTOUCH) 100 UNIT/ML FlexPen INJECT 14 UNITS SUBCUTANEOUSLY EVERY NIGHT AT BEDTIME 15 mL 2   No facility-administered medications prior to visit.    Allergies  Allergen Reactions   Nitrofuran Derivatives Hives and Itching   Codeine    Morphine And Related    Sulfa Antibiotics     Review of Systems  Constitutional:  Negative for chills, fatigue and fever.  HENT:  Negative for congestion, ear pain and sore throat.   Eyes:  Negative for visual disturbance.  Respiratory:  Negative for cough and shortness of breath.   Cardiovascular:  Negative for chest pain and palpitations.  Gastrointestinal:  Negative for abdominal pain, constipation, diarrhea, nausea and vomiting.  Endocrine: Negative for polydipsia, polyphagia and polyuria.  Genitourinary:  Negative for difficulty urinating and dysuria.  Musculoskeletal:  Negative for arthralgias, back pain and myalgias.  Skin:  Negative for rash.       Tender area under left armpit  Cellulitis on right side of the face.  Neurological:  Negative for headaches.  Psychiatric/Behavioral:  Negative for dysphoric mood. The patient is not nervous/anxious.        Objective:    Physical Exam Vitals reviewed.  Constitutional:      General: She is not in acute distress.    Appearance: Normal appearance.  HENT:     Head: Normocephalic.     Right Ear: Tympanic membrane normal.     Left Ear: Tympanic membrane normal.  Cardiovascular:     Rate and Rhythm: Normal rate and regular rhythm.     Pulses: Normal pulses.     Heart sounds: Normal heart sounds. No murmur heard.    No gallop.     Comments: Left chest wall pain  starts on left 4th costochondral junction Pulmonary:     Effort: Pulmonary effort is normal. No respiratory distress.     Breath sounds: Normal breath sounds. No wheezing.  Abdominal:     General: Abdomen is flat. Bowel sounds are normal. There is no distension.     Tenderness: There is no abdominal tenderness.  Musculoskeletal:        General: Normal range of motion.     Cervical back: Normal range of motion and neck supple.  Right lower leg: No edema.     Left lower leg: No edema.  Skin:    General: Skin is warm.     Capillary Refill: Capillary refill takes less than 2 seconds.     Comments: Infected papule right cheek.  Neurological:     General: No focal deficit present.     Mental Status: She is alert and oriented to person, place, and time. Mental status is at baseline.     Gait: Gait normal.  Psychiatric:        Mood and Affect: Mood normal.        Thought Content: Thought content normal.     BP 94/60   Pulse 60   Temp (!) 97.3 F (36.3 C)   Ht _0  (1.626 m)   Wt 134 lb (60.8 kg)   LMP  (LMP Unknown)   SpO2 92%   BMI 23.00 kg/m  Wt Readings from Last 3 Encounters:  02/23/22 134 lb (60.8 kg)  01/20/22 130 lb (59 kg)  11/03/21 135 lb (61.2 kg)    Health Maintenance Due  Topic Date Due   DTaP/Tdap/Td (1 - Tdap) Never done   Zoster Vaccines- Shingrix (1 of 2) Never done   MAMMOGRAM  11/30/2019   OPHTHALMOLOGY EXAM  03/19/2021   Medicare Annual Wellness (AWV)  11/12/2021   COVID-19 Vaccine (4 - 2023-24 season) 11/13/2021    There are no preventive care reminders to display for this patient.   No results found for: "TSH" Lab Results  Component Value Date   WBC 8.4 10/29/2021   HGB 13.8 10/29/2021   HCT 45.4 10/29/2021   MCV 88 10/29/2021   PLT 266 10/29/2021   Lab Results  Component Value Date   NA 144 10/29/2021   K 4.1 10/29/2021   CO2 25 10/29/2021   GLUCOSE 75 10/29/2021   BUN 10 10/29/2021   CREATININE 0.90 10/29/2021   BILITOT 0.8  10/29/2021   ALKPHOS 63 10/29/2021   AST 20 10/29/2021   ALT 17 10/29/2021   PROT 6.6 10/29/2021   ALBUMIN 4.4 10/29/2021   CALCIUM 9.0 10/29/2021   EGFR 64 10/29/2021   Lab Results  Component Value Date   CHOL 155 10/29/2021   Lab Results  Component Value Date   HDL 58 10/29/2021   Lab Results  Component Value Date   LDLCALC 77 10/29/2021   Lab Results  Component Value Date   TRIG 112 10/29/2021   Lab Results  Component Value Date   CHOLHDL 2.7 10/29/2021   Lab Results  Component Value Date   HGBA1C 7.4 (H) 10/29/2021       Assessment & Plan:   Problem List Items Addressed This Visit       Endocrine   Type 2 diabetes mellitus with other specified complication (HCC)   Relevant Medications   insulin detemir (LEVEMIR FLEXTOUCH) 100 UNIT/ML FlexPen An individual care plan for diabetes was established and reinforced today.  The patient's status was assessed using clinical findings on exam, labs and diagnostic testing. Patient success at meeting goals based on disease specific evidence-based guidelines and found to be good controlled. Medications were assessed and patient's understanding of the medical issues , including barriers were assessed. Recommend adherence to a diabetic diet, a graduated exercise program, HgbA1c level is checked quarterly, and urine microalbumin performed yearly .  Annual mono-filament sensation testing performed. Lower blood pressure and control hyperlipidemia is important. Get annual eye exams and annual flu shots and smoking cessation  discussed.  Self management goals were discussed.      Musculoskeletal and Integument   Folliculitis   Relevant Medications   mupirocin ointment (BACTROBAN) 2 % Infected folicle on right cheek, use bactroban     Other   Costochondral chest pain - Primary   Relevant Medications   traMADol (ULTRAM) 50 MG tablet   Other Relevant Orders   DG Chest 2 View Pain at cc junction radiating to left shoulder, full  ROM shoulder , clear lungs   Other Visit Diagnoses     Dysuria       Relevant Orders   POCT URINALYSIS DIP (CLINITEK) (Completed)   Urine Culture 1+ leukocytes, culture before treatment      Meds ordered this encounter  Medications   insulin detemir (LEVEMIR FLEXTOUCH) 100 UNIT/ML FlexPen    Sig: Inject 12 Units into the skin daily. INJECT 14 UNITS SUBCUTANEOUSLY EVERY NIGHT AT BEDTIME    Dispense:  15 mL    Refill:  2    Please ask patient to call the office to make a fasting follow up appt in 10/2021. Thank you, Dr Tobie Poet   mupirocin ointment (BACTROBAN) 2 %    Sig: Apply 1 Application topically 2 (two) times daily.    Dispense:  22 g    Refill:  2   traMADol (ULTRAM) 50 MG tablet    Sig: Take 1 tablet (50 mg total) by mouth every 8 (eight) hours as needed for up to 5 days.    Dispense:  15 tablet    Refill:  0    Orders Placed This Encounter  Procedures   Urine Culture   DG Chest 2 View   POCT URINALYSIS DIP (CLINITEK)     Follow-up: Return in about 4 months (around 06/25/2022).  An After Visit Summary was printed and given to the patient.  Reinaldo Meeker, MD [facility] [phone]

## 2022-02-24 ENCOUNTER — Telehealth: Payer: Medicare HMO

## 2022-02-24 ENCOUNTER — Ambulatory Visit (INDEPENDENT_AMBULATORY_CARE_PROVIDER_SITE_OTHER): Payer: Medicare HMO

## 2022-02-24 ENCOUNTER — Other Ambulatory Visit: Payer: Self-pay | Admitting: Legal Medicine

## 2022-02-24 DIAGNOSIS — F339 Major depressive disorder, recurrent, unspecified: Secondary | ICD-10-CM

## 2022-02-24 DIAGNOSIS — Z780 Asymptomatic menopausal state: Secondary | ICD-10-CM

## 2022-02-24 DIAGNOSIS — E1169 Type 2 diabetes mellitus with other specified complication: Secondary | ICD-10-CM

## 2022-02-24 DIAGNOSIS — Z794 Long term (current) use of insulin: Secondary | ICD-10-CM

## 2022-02-24 DIAGNOSIS — E782 Mixed hyperlipidemia: Secondary | ICD-10-CM

## 2022-02-24 LAB — URINE CULTURE

## 2022-02-24 NOTE — Progress Notes (Signed)
Chronic Care Management Pharmacy Note  02/24/2022 Name:  Rebecca Hodge MRN:  [id number] DOB:  03/18/1940  Summary: 81 year old female presents for f/u CCM visit. She lives with her daughter. Has 4 daughters, 12 grandkids, and 12 great grandkids. She likes to walk and camp for fun and has a chiwawa named Cookie she enjoys caring for and walking  Recommendations/Changes made from today's visit: -Patient experiencing hypoglycemia. Sent msg to [facility] for f/u in 2 weeks. Recommend Dc'ing Glimepiride -Recommend DEXA scan, unable to find one in chart   Subjective: Rebecca Hodge is an 81 y.o. year old female who is a primary patient of Henrene Pastor, Zeb Comfort, MD.  The CCM team was consulted for assistance with disease management and care coordination needs.    Engaged with patient by telephone for follow up visit in response to provider referral for pharmacy case management and/or care coordination services.   Consent to Services:  The patient was given the following information about Chronic Care Management services today, agreed to services, and gave verbal consent: 1. CCM service includes personalized support from designated clinical staff supervised by the primary care provider, including individualized plan of care and coordination with other care providers 2. 24/7 contact phone numbers for assistance for urgent and routine care needs. 3. Service will only be billed when office clinical staff spend 20 minutes or more in a month to coordinate care. 4. Only one practitioner may furnish and bill the service in a calendar month. 5.The patient may stop CCM services at any time (effective at the end of the month) by phone call to the office staff. 6. The patient will be responsible for cost sharing (co-pay) of up to 20% of the service fee (after annual deductible is met). Patient agreed to services and consent obtained.  Patient Care Team: Lillard Anes, MD as PCP - General (Family  Medicine) Lane Hacker, [facility] as Pharmacist (Pharmacist)  Recent office visits:  01/20/22 Reinaldo Meeker MD. Seen for routine visit. No med changes.   Recent consult visits:  None   Hospital visits:  None   Objective:  Lab Results  Component Value Date   CREATININE 0.90 10/29/2021   BUN 10 10/29/2021   GFRNONAA 50 (L) 12/24/2019   GFRAA 57 (L) 12/24/2019   NA 144 10/29/2021   K 4.1 10/29/2021   CALCIUM 9.0 10/29/2021   CO2 25 10/29/2021   GLUCOSE 75 10/29/2021    Lab Results  Component Value Date/Time   HGBA1C 7.4 (H) 10/29/2021 09:01 AM   HGBA1C 7.6 (H) 06/22/2021 09:55 AM   MICROALBUR 30 07/21/2020 11:11 AM   MICROALBUR 30 06/25/2019 09:02 AM    Last diabetic Eye exam:  Lab Results  Component Value Date/Time   HMDIABEYEEXA No Retinopathy 03/19/2020 12:00 AM    Last diabetic Foot exam: No results found for: "HMDIABFOOTEX"   Lab Results  Component Value Date   CHOL 155 10/29/2021   HDL 58 10/29/2021   LDLCALC 77 10/29/2021   TRIG 112 10/29/2021   CHOLHDL 2.7 10/29/2021       Latest Ref Rng & Units 10/29/2021    9:01 AM 06/22/2021    9:55 AM 02/16/2021   10:49 AM  Hepatic Function  Total Protein 6.0 - 8.5 g/dL 6.6  6.5  6.8   Albumin 3.7 - 4.7 g/dL 4.4  4.4  4.5   AST 0 - 40 IU/L _0 ALT 0 - 32 IU/L 17  11  10   Alk Phosphatase 44 - 121 IU/L 63  62  69   Total Bilirubin 0.0 - 1.2 mg/dL 0.8  0.8  0.6     No results found for: "TSH", "FREET4"     Latest Ref Rng & Units 10/29/2021    9:01 AM 02/16/2021   10:49 AM 07/21/2020   11:07 AM  CBC  WBC 3.4 - 10.8 x10E3/uL 8.4  8.7  6.9   Hemoglobin 11.1 - 15.9 g/dL 13.8  14.4  14.6   Hematocrit 34.0 - 46.6 % 45.4  45.6  44.2   Platelets 150 - 450 x10E3/uL 266  230  221     Lab Results  Component Value Date/Time   VD25OH 74.8 10/29/2021 09:01 AM   VD25OH 26.3 (L) 06/22/2021 09:55 AM    Clinical ASCVD: Yes  The ASCVD Risk score (Arnett DK, et al., 2019) failed to calculate for the following  reasons:   The 2019 ASCVD risk score is only valid for ages 75 to 9       11/03/2021    2:24 PM 06/22/2021    4:22 PM 06/22/2021    9:23 AM  Depression screen PHQ 2/9  Decreased Interest 0 0 0  Down, Depressed, Hopeless 0 0 0  PHQ - 2 Score 0 0 0  Altered sleeping 0    Tired, decreased energy 0    Change in appetite 0    Feeling bad or failure about yourself  0    Trouble concentrating 0    Moving slowly or fidgety/restless 0    Suicidal thoughts 0    PHQ-9 Score 0       Other: (CHADS2VASc if Afib, MMRC or CAT for COPD, ACT, DEXA)  Social History   Tobacco Use  Smoking Status Former   Packs/day: 1.00   Years: 5.00   Total pack years: 5.00   Types: Cigarettes   Quit date: 03/16/1987   Years since quitting: 34.9  Smokeless Tobacco Never   BP Readings from Last 3 Encounters:  02/23/22 94/60  01/20/22 130/60  11/03/21 120/80   Pulse Readings from Last 3 Encounters:  02/23/22 60  01/20/22 (!) 43  11/03/21 67   Wt Readings from Last 3 Encounters:  02/23/22 134 lb (60.8 kg)  01/20/22 130 lb (59 kg)  11/03/21 135 lb (61.2 kg)   BMI Readings from Last 3 Encounters:  02/23/22 23.00 kg/m  01/20/22 22.31 kg/m  11/03/21 23.17 kg/m    Assessment/Interventions: Review of patient past medical history, allergies, medications, health status, including review of consultants reports, laboratory and other test data, was performed as part of comprehensive evaluation and provision of chronic care management services.   SDOH:  (Social Determinants of Health) assessments and interventions performed: Yes SDOH Interventions    Flowsheet Row Chronic Care Management from 02/24/2022 in [place] Management from 10/20/2021 in [place] Management from 04/22/2021 in [place] Office Visit from 06/03/2020 in [place] Interventions      Transportation Interventions Intervention Not Indicated Intervention Not Indicated  Intervention Not Indicated --  Depression Interventions/Treatment  -- -- -- Referral to Psychiatry  Financial Strain Interventions Intervention Not Indicated Intervention Not Indicated Intervention Not Indicated --      SDOH Screenings   Transportation Needs: No Transportation Needs (02/24/2022)  Alcohol Screen: Low Risk  (12/23/2020)  Depression (PHQ2-9): Low Risk  (11/03/2021)  Financial Resource Strain: Low Risk  (02/24/2022)  Tobacco Use: Medium Risk (02/23/2022)    CCM Care Plan  Allergies  Allergen Reactions   Nitrofuran Derivatives Hives and Itching   Codeine    Morphine And Related    Sulfa Antibiotics     Medications Reviewed Today     Reviewed by Lane Hacker, [facility] (Pharmacist) on 02/24/22 at 1329  Med List Status: <None>   Medication Order Taking? Sig Documenting Provider Last Dose Status Informant  ACCU-CHEK GUIDE test strip [id number] No 1 each by Other route daily. Check blood sugar fasting in the morning daily. Lillard Anes, MD Taking Active   alendronate (FOSAMAX) 70 MG tablet [id number] No TAKE 1 TABLET BY MOUTH WEEKLY ON THE SAME DAY EACH WEEK, WITH A FULL GLASS OF WATER AT LEAST 30 MINUTES BEFORE FIRST FOOD OR BEVERAGE OF THE DAY. REMAIN UPRIGHT FOR AT LEAST [place] Lillard Anes, MD Taking Active   BD PEN NEEDLE NANO U/F 32G X 4 MM MISC [id number] No See admin instructions. [provider] Taking Active   Blood Glucose Monitoring Suppl (ACCU-CHEK GUIDE) w/Device KIT [id number] No 1 each by Other route daily. Lillard Anes, MD Taking Active   estradiol (ESTRACE) 0.1 MG/GM vaginal cream [id number] No INSERT 1 APPLICATORFUL VAGINALLY 3 TIMES A WEEK Lillard Anes, MD Taking Active   ezetimibe (ZETIA) 10 MG tablet [id number] No TAKE 1 TABLET(10 MG) BY MOUTH DAILY Lillard Anes, MD Taking Active   gabapentin (NEURONTIN) 300 MG capsule [id number] No Take 1 capsule (300 mg total) by mouth 3 (three)  times daily. Lillard Anes, MD Taking Active   glimepiride (AMARYL) 1 MG tablet [id number] No Take 1 mg by mouth daily. [provider] Taking Active   insulin detemir (LEVEMIR FLEXTOUCH) 100 UNIT/ML FlexPen [id number]  Inject 12 Units into the skin daily. INJECT 14 UNITS SUBCUTANEOUSLY EVERY NIGHT AT BEDTIME Lillard Anes, MD  Active   Lancets [facility] ULTRASOFT) lancets [id number] No 1 each by Other route daily. Use as instructed Lillard Anes, MD Taking Active   liraglutide (VICTOZA) 18 MG/3ML SOPN [id number] No INJECT 0.2 MILLILITERS (1.2 MG TOTAL) UNDER THE SKIN DAILY. Lillard Anes, MD Taking Active   lisinopril (ZESTRIL) 20 MG tablet [id number] No Take 20 mg by mouth daily. [provider] Taking Active   metoprolol tartrate (LOPRESSOR) 25 MG tablet [id number] No Take 1 tablet (25 mg total) by mouth 2 (two) times daily. Lillard Anes, MD Taking Active   mirtazapine (REMERON) 15 MG tablet [id number] No TAKE 1 TABLET(15 MG) BY MOUTH AT BEDTIME Lillard Anes, MD Taking Active   mupirocin ointment (BACTROBAN) 2 % [id number]  Apply 1 Application topically 2 (two) times daily. Lillard Anes, MD  Active   nitrofurantoin (MACRODANTIN) 50 MG capsule [id number] No TAKE 1 CAPSULE(50 MG) BY MOUTH AT BEDTIME Lillard Anes, MD Taking Active   pantoprazole (PROTONIX) 40 MG tablet [id number] No Take 1 tablet (40 mg total) by mouth daily. Lillard Anes, MD Taking Active   rosuvastatin (CRESTOR) 40 MG tablet [id number] No Take 1 tablet (40 mg total) by mouth at bedtime. Lillard Anes, MD Taking Active   traMADol Veatrice Bourbon) 50 MG tablet [id number]  Take 1 tablet (50 mg total) by mouth every 8 (eight) hours as needed for up to 5 days. Lillard Anes, MD  Active   Vitamin D, Ergocalciferol, (DRISDOL) 1.25 MG (50000 UNIT) CAPS capsule [id number] No Take 1 capsule (50,000 Units total) by mouth every  7 (seven) days.  Lillard Anes, MD Taking Active             Patient Active Problem List   Diagnosis Date Noted   Costochondral chest pain 95/11/3265   Folliculitis 12/45/8099   BMI 22.0-22.9, adult 01/20/2022   Depression, recurrent ([place]) 06/22/2021   Atherosclerosis of aorta ([place]) 06/22/2021   Sleep disorder 06/03/2020   Sequelae of poliomyelitis 06/18/2019   Dysphasia following cerebral infarction 06/18/2019   Mixed incontinence 06/18/2019   Type 2 diabetes mellitus with other specified complication ([place]) 83/38/2505   Age-related osteoporosis without current pathological fracture 06/18/2019   Long term (current) use of insulin ([place]) 06/18/2019   Postmenopausal atrophic vaginitis 06/18/2019   Mixed hyperlipidemia 06/18/2019   Solitary pulmonary nodule 12/13/2012    Immunization History  Administered Date(s) Administered   Fluad Quad(high Dose 65+) 12/24/2019   Influenza Split 12/14/2011, 12/13/2012   Influenza-Unspecified 11/14/2020, 11/03/2021   Moderna SARS-COV2 Booster Vaccination 06/03/2020   PFIZER(Purple Top)SARS-COV-2 Vaccination 06/21/2019, 07/16/2019   Pneumococcal Conjugate-13 08/31/2017   Pneumococcal Polysaccharide-23 06/11/2016    Conditions to be addressed/monitored:  Hypertension, Hyperlipidemia, Diabetes, and Depression  Care Plan : [place]  Updates made by Lane Hacker, [place] since 02/24/2022 12:00 AM     Problem: DM, HTN, Lipids, Osteopenia   Priority: High  Onset Date: 11/21/2020     Long-Range Goal: Disease State Management   Start Date: 11/21/2020  Expected End Date: 11/21/2021  Recent Progress: On track  Priority: High  Note:   Current Barriers:  Unable to achieve control of DM  Does not adhere to prescribed medication regimen Does not maintain contact with provider office Does not contact provider office for questions/concerns  Pharmacist Clinical Goal(s):  Patient will achieve adherence to monitoring guidelines and  medication adherence to achieve therapeutic efficacy contact provider office for questions/concerns as evidenced notation of same in electronic health record through collaboration with PharmD and provider.   Interventions: 1:1 collaboration with Lillard Anes, MD regarding development and update of comprehensive plan of care as evidenced by provider attestation and co-signature Inter-disciplinary care team collaboration (see longitudinal plan of care) Comprehensive medication review performed; medication list updated in electronic medical record  Hypertension (BP goal <140/90) BP Readings from Last 3 Encounters:  02/23/22 94/60  01/20/22 130/60  11/03/21 120/80  -Controlled -Current treatment: Metoprolol 18m BID Appropriate, Effective, Safe, Accessible Lisinopril 263mAppropriate, Effective, Safe, Accessible -Medications previously tried: Lisinopril (06/22/21, no reason listed) -Current home readings: Not testing -Current dietary habits: "Tries to eat healthy" -Current exercise habits: Walking her dog -Denies hypotensive/hypertensive symptoms -Educated on  Extensive time spent on counseling patient on blood pressure goal and impact of each antihypertensive medication on their blood pressure and risk reduction for CV disease.  Used analogies to explain the need for multiple antihypertensive medications to achieve BP goals and that it is often a silent disease with no symptoms.  -Counseled to monitor BP at home, document, and provide log at future appointments August 2023: Patient was Dc'd from Lisinopril on 06/22/21 but no reason listed in note. Will ask PCP December 2023: Patient is back on Lisinopril and BP looks low. Will ask about Dc'ing Metoprolol as I don't see an indication on problems list regarding arrhthymias   Hyperlipidemia: (LDL goal < 100) Lab Results  Component Value Date   CHOL 155 10/29/2021   CHOL 186 06/22/2021   CHOL 178 02/16/2021   Lab Results   Component Value Date   HDL  58 10/29/2021   HDL 59 06/22/2021   HDL 61 02/16/2021   Lab Results  Component Value Date   LDLCALC 77 10/29/2021   LDLCALC 101 (H) 06/22/2021   LDLCALC 97 02/16/2021   Lab Results  Component Value Date   TRIG 112 10/29/2021   TRIG 147 06/22/2021   TRIG 114 02/16/2021   Lab Results  Component Value Date   CHOLHDL 2.7 10/29/2021   CHOLHDL 3.2 06/22/2021   CHOLHDL 2.9 02/16/2021  No results found for: "LDLDIRECT" -Controlled -Current treatment: Rosuvastatin 24m Appropriate, Effective, Safe, Accessible Ezetimibe 137mAppropriate, Effective, Safe, Accessible -Medications previously tried: N/A  -Current dietary habits: "Tries to eat healthy" -Current exercise habits: Walking her dog -Educated on: Over 15 minutes spent counseling patient on role of hyperlipidemia on heart disease and the impact of each lipid subclass with emphasis on LDL and heart disease risk.  Explained role of statins in prevention of CV disease complications and actual risk for adverse effects with statin treatment.  Counseled patient on genetic component placing them at risk for hyperlipidemia. -Recommended to continue current medication  Diabetes (A1c goal <8%) Lab Results  Component Value Date   HGBA1C 7.4 (H) 10/29/2021   HGBA1C 7.6 (H) 06/22/2021   HGBA1C 7.7 (H) 02/16/2021   Lab Results  Component Value Date   MICROALBUR 30 07/21/2020   LDLCALC 77 10/29/2021   CREATININE 0.90 10/29/2021   Lab Results  Component Value Date   NA 144 10/29/2021   K 4.1 10/29/2021   CREATININE 0.90 10/29/2021   GFRNONAA 50 (L) 12/24/2019   GFRAA 57 (L) 12/24/2019   GLUCOSE 75 10/29/2021   Lab Results  Component Value Date   WBC 8.4 10/29/2021   HGB 13.8 10/29/2021   HCT 45.4 10/29/2021   MCV 88 10/29/2021   PLT 266 10/29/2021  -Controlled -Current medications: Liraglutide 1.79m379mppropriate, Effective, Safe, Accessible Detemir 12 units QD Appropriate, Effective, Safe,  Accessible Glimepiride 1mg64m Query Appropriate,  -Medications previously tried: N/A  -Current home glucose readings (Tests daily) fasting glucose:  August 2023: Doesn't write sugars down. Counseled extensively that she needs to start. Especially since when I asked her what she remembered she told me a week ago Wed her sugars were 195, then when I asked her what day her sugars were in the 70's she said Wed December 2023: 103 110 315 [phone] 28t prandial glucose: only tests fasting -Denies hypoglycemic/hyperglycemic symptoms -Current meal patterns:  breakfast: Didn't specify  lunch: Didn't specify dinner:Didn't specify snacks: Didn't specify drinks: Didn't specify -Current exercise: Walks dog -Educated on A1c and blood sugar goals;  *Extensive time was spent counseling patient on the pathophysiology and risk for complications with uncontrolled Diabetes.  Used teach back method for counseling on ADA diabetic diet.  Time spent on explaining role of carbohydrates versus sugars impacting blood glucose levels and meal planning. Current exercise consists of walking 10 minutes three times a week, plan is to increase to 20 minutes 5 days a week and then after 2-3 weeks increase to 30 minute 5 days a week.  -Counseled to check feet daily and get yearly eye exams Sept 2022:  -Will ask PCP which Liraglutide dose he wishes patient to be on -Will have Concierge check on patient's sugars monthly until we get her below 7 -Will have patient try to schedule f/u with PCP, has no future visits Feb 2023: Will ask PCP about Liraglutide again August 2023: Patient has no f/u with PCP. Stated her  sugars drop to 50's once/month. Forwarded msg to PCP and Lemmie Evens about scheduling f/u ASAP to assess. -Recommend DC Glimepiride since already on insulin December 2023: Patient states she got something in the mail that states she can no longer get her insulin because it's only for weight loss, but that Dr.  Henrene Pastor fixed it yesterday. I am unsure what that is and I asked if she meant Levemir or Victoza and she said Levemir. I asked her to bring in paper in 2 weeks (Couldn't find paper today) for f/u. Also, her sugars are still dropping. She stated PCP didn't ask about sugar levels yesterday and I don't see an A1c. Will let Rehka know who has f/u in 2 weeks. Recommend Dc'ing Glimepiride to help with hypoglycemia  Depression/Anxiety    11/03/2021    2:24 PM 06/22/2021    4:22 PM 06/22/2021    9:23 AM  Depression screen PHQ 2/9  Decreased Interest 0 0 0  Down, Depressed, Hopeless 0 0 0  PHQ - 2 Score 0 0 0  Altered sleeping 0    Tired, decreased energy 0    Change in appetite 0    Feeling bad or failure about yourself  0    Trouble concentrating 0    Moving slowly or fidgety/restless 0    Suicidal thoughts 0    PHQ-9 Score 0        11/12/2020    6:33 PM  GAD 7 : Generalized Anxiety Score  Nervous, Anxious, on Edge 0  Control/stop worrying 0  Worry too much - different things 0  Trouble relaxing 0  Restless 0  Easily annoyed or irritable 0  Afraid - awful might happen 0  Total GAD 7 Score 0  -Controlled -Current treatment: Mirtazapine 82m Appropriate, Effective, Safe, Accessible -Medications previously tried/failed:  -Educated on Benefits of medication for symptom control Sept 2022: Unsure when mirtazapine was started and patient is non-compliant, will ask PCP if it's needed (Considering her age and risk factors) Recommend continue on non-therapy December 2023: Will defer to PCP  Osteoporosis / Osteopenia (Goal Prevent fracture) -Not ideally controlled -Last DEXA Scan: Unknown -Patient is a candidate for pharmacologic treatment due to already being on treatment -Current treatment  Alendronate 747m(Started 2022 per patient) Appropriate, Query effective,  -Medications previously tried: N/A  -Recommend [phone] units of vitamin D daily. Recommend 1200 mg of calcium daily from  dietary and supplemental sources. August 2023: Recommend DEXA for patient. She states it's been at least 3 years but unable to see in chart December 2023; Recommend DEXA     Patient Goals/Self-Care Activities Patient will:  - take medications as prescribed  Follow Up Plan: The patient has been provided with contact information for the care management team and has been advised to call with any health related questions or concerns.   F/U Feb 2024  Na[place] ConstablePhSherian Rein. - [phone]      Medication Assistance:  N/A  Care Gaps: Last eye exam / Retinopathy Screening? 03/19/20 Last Annual Wellness Visit? 11/12/20 Last Diabetic Foot Exam? 01/20/22     Star Rating Drugs:  Medication:                Last Fill:         Day Supply Victoza                        01/13/22-12/16/21 30ds  Patient's preferred pharmacy is:  [id number]  [place], [place] - [address] AT [place] [address] [place] [address] Phone: [phone] Fax: [phone]   Uses pill box? Yes Pt endorses 100% compliance (Fill Hx shows she's non-compliant  We discussed: Benefits of medication synchronization, packaging and delivery as well as enhanced pharmacist oversight with [facility]. Patient decided to: Utilize [facility] pharmacy for medication synchronization, packaging and delivery  Care Plan and Follow Up Patient Decision:  Patient agrees to Care Plan and Follow-up.  Plan: The patient has been provided with contact information for the care management team and has been advised to call with any health related questions or concerns.   CPP F/U Feb 2024  [place] Constable, [place].D. - [phone]

## 2022-02-24 NOTE — Patient Instructions (Signed)
Visit Information   Goals Addressed   None    Patient Care Plan: CCM Pharmacy Care Plan     Problem Identified: DM, HTN, Lipids, Osteopenia   Priority: High  Onset Date: 11/21/2020     Long-Range Goal: Disease State Management   Start Date: 11/21/2020  Expected End Date: 11/21/2021  Recent Progress: On track  Priority: High  Note:   Current Barriers:  Unable to achieve control of DM  Does not adhere to prescribed medication regimen Does not maintain contact with provider office Does not contact provider office for questions/concerns  Pharmacist Clinical Goal(s):  Patient will achieve adherence to monitoring guidelines and medication adherence to achieve therapeutic efficacy contact provider office for questions/concerns as evidenced notation of same in electronic health record through collaboration with PharmD and provider.   Interventions: 1:1 collaboration with Lillard Anes, MD regarding development and update of comprehensive plan of care as evidenced by provider attestation and co-signature Inter-disciplinary care team collaboration (see longitudinal plan of care) Comprehensive medication review performed; medication list updated in electronic medical record  Hypertension (BP goal <140/90) BP Readings from Last 3 Encounters:  02/23/22 94/60  01/20/22 130/60  11/03/21 120/80  -Controlled -Current treatment: Metoprolol '25mg'$  BID Appropriate, Effective, Safe, Accessible Lisinopril '20mg'$  Appropriate, Effective, Safe, Accessible -Medications previously tried: Lisinopril (06/22/21, no reason listed) -Current home readings: Not testing -Current dietary habits: "Tries to eat healthy" -Current exercise habits: Walking her dog -Denies hypotensive/hypertensive symptoms -Educated on  Extensive time spent on counseling patient on blood pressure goal and impact of each antihypertensive medication on their blood pressure and risk reduction for CV disease.  Used analogies to  explain the need for multiple antihypertensive medications to achieve BP goals and that it is often a silent disease with no symptoms.  -Counseled to monitor BP at home, document, and provide log at future appointments August 2023: Patient was Dc'd from Lisinopril on 06/22/21 but no reason listed in note. Will ask PCP December 2023: Patient is back on Lisinopril and BP looks low. Will ask about Dc'ing Metoprolol as I don't see an indication on problems list regarding arrhthymias   Hyperlipidemia: (LDL goal < 100) Lab Results  Component Value Date   CHOL 155 10/29/2021   CHOL 186 06/22/2021   CHOL 178 02/16/2021   Lab Results  Component Value Date   HDL 58 10/29/2021   HDL 59 06/22/2021   HDL 61 02/16/2021   Lab Results  Component Value Date   LDLCALC 77 10/29/2021   LDLCALC 101 (H) 06/22/2021   LDLCALC 97 02/16/2021   Lab Results  Component Value Date   TRIG 112 10/29/2021   TRIG 147 06/22/2021   TRIG 114 02/16/2021   Lab Results  Component Value Date   CHOLHDL 2.7 10/29/2021   CHOLHDL 3.2 06/22/2021   CHOLHDL 2.9 02/16/2021  No results found for: "LDLDIRECT" -Controlled -Current treatment: Rosuvastatin '40mg'$  Appropriate, Effective, Safe, Accessible Ezetimibe '10mg'$  Appropriate, Effective, Safe, Accessible -Medications previously tried: N/A  -Current dietary habits: "Tries to eat healthy" -Current exercise habits: Walking her dog -Educated on: Over 15 minutes spent counseling patient on role of hyperlipidemia on heart disease and the impact of each lipid subclass with emphasis on LDL and heart disease risk.  Explained role of statins in prevention of CV disease complications and actual risk for adverse effects with statin treatment.  Counseled patient on genetic component placing them at risk for hyperlipidemia. -Recommended to continue current medication  Diabetes (A1c goal <8%) Lab Results  Component Value Date   HGBA1C 7.4 (H) 10/29/2021   HGBA1C 7.6 (H) 06/22/2021    HGBA1C 7.7 (H) 02/16/2021   Lab Results  Component Value Date   MICROALBUR 30 07/21/2020   LDLCALC 77 10/29/2021   CREATININE 0.90 10/29/2021   Lab Results  Component Value Date   NA 144 10/29/2021   K 4.1 10/29/2021   CREATININE 0.90 10/29/2021   GFRNONAA 50 (L) 12/24/2019   GFRAA 57 (L) 12/24/2019   GLUCOSE 75 10/29/2021   Lab Results  Component Value Date   WBC 8.4 10/29/2021   HGB 13.8 10/29/2021   HCT 45.4 10/29/2021   MCV 88 10/29/2021   PLT 266 10/29/2021  -Controlled -Current medications: Liraglutide 1.'8mg'$  Appropriate, Effective, Safe, Accessible Detemir 12 units QD Appropriate, Effective, Safe, Accessible Glimepiride '1mg'$  AM Query Appropriate,  -Medications previously tried: N/A  -Current home glucose readings (Tests daily) fasting glucose:  August 2023: Doesn't write sugars down. Counseled extensively that she needs to start. Especially since when I asked her what she remembered she told me a week ago Wed her sugars were 195, then when I asked her what day her sugars were in the 70's she said Wed December 2023: [phone] post prandial glucose: only tests fasting -Denies hypoglycemic/hyperglycemic symptoms -Current meal patterns:  breakfast: Didn't specify  lunch: Didn't specify dinner:Didn't specify snacks: Didn't specify drinks: Didn't specify -Current exercise: Walks dog -Educated on A1c and blood sugar goals;  *Extensive time was spent counseling patient on the pathophysiology and risk for complications with uncontrolled Diabetes.  Used teach back method for counseling on ADA diabetic diet.  Time spent on explaining role of carbohydrates versus sugars impacting blood glucose levels and meal planning. Current exercise consists of walking 10 minutes three times a week, plan is to increase to 20 minutes 5 days a week and then after 2-3 weeks increase to 30 minute 5 days a week.  -Counseled to check feet daily and get yearly eye  exams Sept 2022:  -Will ask PCP which Liraglutide dose he wishes patient to be on -Will have Concierge check on patient's sugars monthly until we get her below 7 -Will have patient try to schedule f/u with PCP, has no future visits Feb 2023: Will ask PCP about Liraglutide again August 2023: Patient has no f/u with PCP. Stated her sugars drop to 50's once/month. Forwarded msg to PCP and Lemmie Evens about scheduling f/u ASAP to assess. -Recommend DC Glimepiride since already on insulin December 2023: Patient states she got something in the mail that states she can no longer get her insulin because it's only for weight loss, but that Dr. Henrene Pastor fixed it yesterday. I am unsure what that is and I asked if she meant Levemir or Victoza and she said Levemir. I asked her to bring in paper in 2 weeks (Couldn't find paper today) for f/u. Also, her sugars are still dropping. She stated PCP didn't ask about sugar levels yesterday and I don't see an A1c. Will let Rehka know who has f/u in 2 weeks. Recommend Dc'ing Glimepiride to help with hypoglycemia  Depression/Anxiety    11/03/2021    2:24 PM 06/22/2021    4:22 PM 06/22/2021    9:23 AM  Depression screen PHQ 2/9  Decreased Interest 0 0 0  Down, Depressed, Hopeless 0 0 0  PHQ - 2 Score 0 0 0  Altered sleeping 0    Tired, decreased energy 0    Change in appetite  0    Feeling bad or failure about yourself  0    Trouble concentrating 0    Moving slowly or fidgety/restless 0    Suicidal thoughts 0    PHQ-9 Score 0        11/12/2020    6:33 PM  GAD 7 : Generalized Anxiety Score  Nervous, Anxious, on Edge 0  Control/stop worrying 0  Worry too much - different things 0  Trouble relaxing 0  Restless 0  Easily annoyed or irritable 0  Afraid - awful might happen 0  Total GAD 7 Score 0  -Controlled -Current treatment: Mirtazapine '15mg'$  Appropriate, Effective, Safe, Accessible -Medications previously tried/failed:  -Educated on Benefits of medication for  symptom control Sept 2022: Unsure when mirtazapine was started and patient is non-compliant, will ask PCP if it's needed (Considering her age and risk factors) Recommend continue on non-therapy December 2023: Will defer to PCP  Osteoporosis / Osteopenia (Goal Prevent fracture) -Not ideally controlled -Last DEXA Scan: Unknown -Patient is a candidate for pharmacologic treatment due to already being on treatment -Current treatment  Alendronate '70mg'$  (Started 2022 per patient) Appropriate, Query effective,  -Medications previously tried: N/A  -Recommend [phone] units of vitamin D daily. Recommend 1200 mg of calcium daily from dietary and supplemental sources. August 2023: Recommend DEXA for patient. She states it's been at least 3 years but unable to see in chart December 2023; Recommend DEXA     Patient Goals/Self-Care Activities Patient will:  - take medications as prescribed  Follow Up Plan: The patient has been provided with contact information for the care management team and has been advised to call with any health related questions or concerns.   F/U Feb 2024  Rebecca Hodge, Sherian Rein.D. - [phone]       Rebecca Hodge was given information about Chronic Care Management services today including:  CCM service includes personalized support from designated clinical staff supervised by her physician, including individualized plan of care and coordination with other care providers 24/7 contact phone numbers for assistance for urgent and routine care needs. Standard insurance, coinsurance, copays and deductibles apply for chronic care management only during months in which we provide at least 20 minutes of these services. Most insurances cover these services at 100%, however patients may be responsible for any copay, coinsurance and/or deductible if applicable. This service may help you avoid the need for more expensive face-to-face services. Only one practitioner may furnish and bill  the service in a calendar month. The patient may stop CCM services at any time (effective at the end of the month) by phone call to the office staff.  Patient agreed to services and verbal consent obtained.   The patient verbalized understanding of instructions, educational materials, and care plan provided today and DECLINED offer to receive copy of patient instructions, educational materials, and care plan.  The pharmacy team will reach out to the patient again over the next 60 days.   Lane Hacker, [place]

## 2022-02-25 NOTE — Progress Notes (Signed)
Urine culture negative ?lp

## 2022-03-03 DIAGNOSIS — E1169 Type 2 diabetes mellitus with other specified complication: Secondary | ICD-10-CM | POA: Diagnosis not present

## 2022-03-10 ENCOUNTER — Ambulatory Visit (INDEPENDENT_AMBULATORY_CARE_PROVIDER_SITE_OTHER): Payer: Medicare HMO | Admitting: Nurse Practitioner

## 2022-03-10 ENCOUNTER — Encounter: Payer: Self-pay | Admitting: Nurse Practitioner

## 2022-03-10 VITALS — BP 128/70 | HR 60 | Temp 97.3°F | Ht 64.0 in | Wt 132.0 lb

## 2022-03-10 DIAGNOSIS — N390 Urinary tract infection, site not specified: Secondary | ICD-10-CM

## 2022-03-10 DIAGNOSIS — M810 Age-related osteoporosis without current pathological fracture: Secondary | ICD-10-CM | POA: Diagnosis not present

## 2022-03-10 DIAGNOSIS — Z794 Long term (current) use of insulin: Secondary | ICD-10-CM | POA: Diagnosis not present

## 2022-03-10 DIAGNOSIS — Z9889 Other specified postprocedural states: Secondary | ICD-10-CM

## 2022-03-10 DIAGNOSIS — M25519 Pain in unspecified shoulder: Secondary | ICD-10-CM | POA: Diagnosis not present

## 2022-03-10 DIAGNOSIS — E1169 Type 2 diabetes mellitus with other specified complication: Secondary | ICD-10-CM

## 2022-03-10 LAB — POCT URINALYSIS DIP (CLINITEK)
Bilirubin, UA: NEGATIVE
Blood, UA: NEGATIVE
Glucose, UA: NEGATIVE mg/dL
Ketones, POC UA: NEGATIVE mg/dL
Nitrite, UA: NEGATIVE
POC PROTEIN,UA: 30 — AB
Spec Grav, UA: >=1.03 — AB (ref 1.010–1.025)
Urobilinogen, UA: 0.2 E.U./dL
pH, UA: 5.5 (ref 5.0–8.0)

## 2022-03-10 MED ORDER — NITROFURANTOIN MONOHYD MACRO 100 MG PO CAPS: 100.0000 mg | ORAL_CAPSULE | Freq: Two times a day (BID) | ORAL | 0 refills | Status: AC

## 2022-03-10 MED ORDER — DICLOFENAC SODIUM 1 % EX GEL: 4.0000 g | Freq: Four times a day (QID) | CUTANEOUS | 1 refills | Status: DC

## 2022-03-10 NOTE — Assessment & Plan Note (Signed)
Diclofenac Gel apply as needed Take tylenol as needed, heat and cold compress and OTC lidocaine patch as needed shoulder exercises recommended Ambulatory referral to physical therapy

## 2022-03-10 NOTE — Assessment & Plan Note (Signed)
Finish the course of Macrobid, record shows that mild sulfa reaction but patient tolerated macrobid in July 2023, if skin itching and rashes appear after first dose, call office for alternatives  Will recheck culture in next week since patient is not able to urinate today for culture

## 2022-03-10 NOTE — Progress Notes (Addendum)
Subjective:  Patient ID: Rebecca Hodge, female    DOB: 12-18-1940  Age: 81 y.o. MRN: [id number]  CHIEF COMPLAINT: Bilateral shoulder blades pain Lower abdomen pain HPI: Patient is her for ongoing rotator cuff pain, although this time she does not have any pain but she is concerned it is bothering her lately.She wants to go to physical therapy if possible as she has a good experience with them in the past.  she is having some pain in lower abdomen. She has a mixed incontinence history. Patient mentioned she could tolerate Macrobid in past without any side effects. She wants DEXA scan to be done as she is concerned about her ongoing osteoporosis and she wants to check her urine. Denies Dysuria and hematuria but complaints of lower abdominal pain.    Current Outpatient Medications on File Prior to Visit  Medication Sig Dispense Refill   ACCU-CHEK GUIDE test strip 1 each by Other route daily. Check blood sugar fasting in the morning daily. 100 each 2   alendronate (FOSAMAX) 70 MG tablet TAKE 1 TABLET BY MOUTH WEEKLY ON THE SAME DAY EACH WEEK, WITH A FULL GLASS OF WATER AT LEAST 30 MINUTES BEFORE FIRST FOOD OR BEVERAGE OF THE DAY. REMAIN UPRIGHT FOR AT LEAST 30 MINUTES AFTER TAKING 12 tablet 1   BD PEN NEEDLE NANO U/F 32G X 4 MM MISC See admin instructions.     Blood Glucose Monitoring Suppl (ACCU-CHEK GUIDE) w/Device KIT 1 each by Other route daily. 1 kit 0   estradiol (ESTRACE) 0.1 MG/GM vaginal cream INSERT 1 APPLICATORFUL VAGINALLY 3 TIMES A WEEK 42.5 g 3   ezetimibe (ZETIA) 10 MG tablet TAKE 1 TABLET(10 MG) BY MOUTH DAILY 90 tablet 2   gabapentin (NEURONTIN) 300 MG capsule Take 1 capsule (300 mg total) by mouth 3 (three) times daily. 270 capsule 2   insulin detemir (LEVEMIR FLEXTOUCH) 100 UNIT/ML FlexPen Inject 12 Units into the skin daily. INJECT 14 UNITS SUBCUTANEOUSLY EVERY NIGHT AT BEDTIME 15 mL 2   Lancets (ONETOUCH ULTRASOFT) lancets 1 each by Other route daily. Use as instructed 100  each 2   liraglutide (VICTOZA) 18 MG/3ML SOPN INJECT 0.2 MILLILITERS (1.2 MG TOTAL) UNDER THE SKIN DAILY. 6 mL 6   lisinopril (ZESTRIL) 20 MG tablet Take 20 mg by mouth daily.     metoprolol tartrate (LOPRESSOR) 25 MG tablet Take 1 tablet (25 mg total) by mouth 2 (two) times daily. 180 tablet 2   mirtazapine (REMERON) 15 MG tablet TAKE 1 TABLET(15 MG) BY MOUTH AT BEDTIME 30 tablet 3   mupirocin ointment (BACTROBAN) 2 % Apply 1 Application topically 2 (two) times daily. 22 g 2   nitrofurantoin (MACRODANTIN) 50 MG capsule TAKE 1 CAPSULE(50 MG) BY MOUTH AT BEDTIME 90 capsule 2   pantoprazole (PROTONIX) 40 MG tablet Take 1 tablet (40 mg total) by mouth daily. 90 tablet 2   rosuvastatin (CRESTOR) 40 MG tablet Take 1 tablet (40 mg total) by mouth at bedtime. 90 tablet 2   Vitamin D, Ergocalciferol, (DRISDOL) 1.25 MG (50000 UNIT) CAPS capsule Take 1 capsule (50,000 Units total) by mouth every 7 (seven) days. 12 capsule 6   No current facility-administered medications on file prior to visit.   Past Medical History:  Diagnosis Date   Age-related osteoporosis without current pathological fracture 06/18/2019   Allergic rhinitis    BMI 27.0-27.9,adult 08/17/2019   Diabetes ([place])    Dysphasia following cerebral infarction 06/18/2019   Hyperlipidemia    Hypertension  Mixed hyperlipidemia 06/18/2019   Mixed incontinence 06/18/2019   Sequelae of poliomyelitis 06/18/2019   Type 2 diabetes mellitus with other specified complication ([place]) 06/14/3242   Past Surgical History:  Procedure Laterality Date   ABDOMINAL HYSTERECTOMY     CATARACT EXTRACTION Right    CHOLECYSTECTOMY     ROTATOR CUFF REPAIR Bilateral     History reviewed. No pertinent family history. Social History   Socioeconomic History   Marital status: Widowed    Spouse name: Not on file   Number of children: Not on file   Years of education: Not on file   Highest education level: Not on file  Occupational History   Occupation: Disabled   Tobacco Use   Smoking status: Former    Packs/day: 1.00    Years: 5.00    Total pack years: 5.00    Types: Cigarettes    Quit date: 03/16/1987    Years since quitting: 35.0   Smokeless tobacco: Never  Substance and Sexual Activity   Alcohol use: No   Drug use: No   Sexual activity: Not Currently  Other Topics Concern   Not on file  Social History Narrative   Not on file   Social Determinants of Health   Financial Resource Strain: Low Risk  (02/24/2022)   Overall Financial Resource Strain (CARDIA)    Difficulty of Paying Living Expenses: Not hard at all  Food Insecurity: Not on file  Transportation Needs: No Transportation Needs (02/24/2022)   PRAPARE - Hydrologist (Medical): No    Lack of Transportation (Non-Medical): No  Physical Activity: Not on file  Stress: Not on file  Social Connections: Not on file    Review of Systems  Constitutional:  Negative for chills and fatigue.  HENT:  Negative for congestion, ear pain, sinus pain and sore throat.   Respiratory:  Negative for cough and shortness of breath.   Cardiovascular:  Negative for chest pain and leg swelling.  Gastrointestinal:  Negative for abdominal pain, constipation, diarrhea, nausea and vomiting.  Genitourinary:  Positive for enuresis and urgency. Negative for dysuria, flank pain and frequency.       Lower abdominal pain  Musculoskeletal:  Negative for arthralgias, back pain and myalgias.  Neurological:  Positive for light-headedness. Negative for dizziness and headaches.     Objective:  BP 128/70   Pulse 60   Temp (!) 97.3 F (36.3 C)   Ht _0  (1.626 m)   Wt 132 lb (59.9 kg)   LMP  (LMP Unknown)   SpO2 99%   BMI 22.66 kg/m      03/10/2022    9:16 AM 02/23/2022   10:03 AM 01/20/2022   10:12 AM  BP/Weight  Systolic BP 010 94 272  Diastolic BP 70 60 60  Wt. (Lbs) 132 134 130  BMI 22.66 kg/m2 23 kg/m2 22.31 kg/m2    Physical Exam Vitals reviewed.   Constitutional:      Appearance: Normal appearance. She is normal weight.  Neck:     Vascular: No carotid bruit.  Cardiovascular:     Rate and Rhythm: Normal rate and regular rhythm.     Heart sounds: Normal heart sounds.  Pulmonary:     Effort: Pulmonary effort is normal.     Breath sounds: Normal breath sounds.  Abdominal:     General: Abdomen is flat. Bowel sounds are normal.     Palpations: Abdomen is soft.     Tenderness: There is no  abdominal tenderness.  Musculoskeletal:        General: Tenderness present. No swelling, deformity or signs of injury.     Cervical back: Normal range of motion.     Comments: H/o bilateral rotator cuff surgery  Skin:    General: Skin is dry.  Neurological:     Mental Status: She is alert and oriented to person, place, and time.  Psychiatric:        Mood and Affect: Mood normal.        Behavior: Behavior normal.        Thought Content: Thought content normal.        Judgment: Judgment normal.      Lab Results  Component Value Date   WBC 8.4 10/29/2021   HGB 13.8 10/29/2021   HCT 45.4 10/29/2021   PLT 266 10/29/2021   GLUCOSE 75 10/29/2021   CHOL 155 10/29/2021   TRIG 112 10/29/2021   HDL 58 10/29/2021   LDLCALC 77 10/29/2021   ALT 17 10/29/2021   AST 20 10/29/2021   NA 144 10/29/2021   K 4.1 10/29/2021   CL 105 10/29/2021   CREATININE 0.90 10/29/2021   BUN 10 10/29/2021   CO2 25 10/29/2021   HGBA1C 7.4 (H) 10/29/2021   MICROALBUR 30 07/21/2020   Assessment & Plan:    Problem List Items Addressed This Visit       Endocrine   Type 2 diabetes mellitus with other specified complication ([place])    Compliant with medicines and under controlled even A1C is up and down looking at the trend of past 2 years, mostly hanging in 7's. Will recheck lab in 1 week        Musculoskeletal and Integument   Osteoporosis    Taking Calcium, vitamin D and Fosamax  Refer for Dexa scan/ will follow up with that      Relevant Orders    Ambulatory referral to Physical Therapy   DG Bone Density     Genitourinary   Urinary tract infection without hematuria - Primary    Finish the course of Macrobid, record shows that mild sulfa reaction but patient tolerated macrobid in July 2023, if skin itching and rashes appear after first dose, call office for alternatives  Will recheck culture in next week since patient is not able to urinate today for culture       Relevant Medications   nitrofurantoin, macrocrystal-monohydrate, (MACROBID) 100 MG capsule   Other Relevant Orders   POCT URINALYSIS DIP (CLINITEK) (Completed)     Other   Shoulder pain with history of repair of rotator cuff    Diclofenac Gel apply as needed Take tylenol as needed, heat and cold compress and OTC lidocaine patch as needed shoulder exercises recommended Ambulatory referral to physical therapy      Relevant Medications   diclofenac Sodium (VOLTAREN) 1 % GEL     Follow-up:  in 1 week to see the effect of medicine, follow up Dexa scan result and lab work.  I, Neil Crouch have reviewed all documentation for this visit. The documentation on 03/10/22   for the exam, diagnosis, procedures, and orders are all accurate and complete.      An After Visit Summary was printed and given to the patient.  Neil Crouch, [place] [phone]

## 2022-03-10 NOTE — Patient Instructions (Addendum)
Follow up in 1 week  Urinary Tract Infection, Adult A urinary tract infection (UTI) is an infection of any part of the urinary tract. The urinary tract includes: The kidneys. The ureters. The bladder. The urethra. These organs make, store, and get rid of pee (urine) in the body. What are the causes? This infection is caused by germs (bacteria) in your genital area. These germs grow and cause swelling (inflammation) of your urinary tract. What increases the risk? The following factors may make you more likely to develop this condition: Using a small, thin tube (catheter) to drain pee. Not being able to control when you pee or poop (incontinence). Being female. If you are female, these things can increase the risk: Using these methods to prevent pregnancy: A medicine that kills sperm (spermicide). A device that blocks sperm (diaphragm). Having low levels of a female hormone (estrogen). Being pregnant. You are more likely to develop this condition if: You have genes that add to your risk. You are sexually active. You take antibiotic medicines. You have trouble peeing because of: A prostate that is bigger than normal, if you are female. A blockage in the part of your body that drains pee from the bladder. A kidney stone. A nerve condition that affects your bladder. Not getting enough to drink. Not peeing often enough. You have other conditions, such as: Diabetes. A weak disease-fighting system (immune system). Sickle cell disease. Gout. Injury of the spine. What are the signs or symptoms? Symptoms of this condition include: Needing to pee right away. Peeing small amounts often. Pain or burning when peeing. Blood in the pee. Pee that smells bad or not like normal. Trouble peeing. Pee that is cloudy. Fluid coming from the vagina, if you are female. Pain in the belly or lower back. Other symptoms include: Vomiting. Not feeling hungry. Feeling mixed up (confused). This may  be the first symptom in older adults. Being tired and grouchy (irritable). A fever. Watery poop (diarrhea). How is this treated? Taking antibiotic medicine. Taking other medicines. Drinking enough water. In some cases, you may need to see a specialist. Follow these instructions at home:  Medicines Take over-the-counter and prescription medicines only as told by your doctor. If you were prescribed an antibiotic medicine, take it as told by your doctor. Do not stop taking it even if you start to feel better. General instructions Make sure you: Pee until your bladder is empty. Do not hold pee for a long time. Empty your bladder after sex. Wipe from front to back after peeing or pooping if you are a female. Use each tissue one time when you wipe. Drink enough fluid to keep your pee pale yellow. Keep all follow-up visits. Contact a doctor if: You do not get better after 1-2 days. Your symptoms go away and then come back. Get help right away if: You have very bad back pain. You have very bad pain in your lower belly. You have a fever. You have chills. You feeling like you will vomit or you vomit. Summary A urinary tract infection (UTI) is an infection of any part of the urinary tract. This condition is caused by germs in your genital area. There are many risk factors for a UTI. Treatment includes antibiotic medicines. Drink enough fluid to keep your pee pale yellow. This information is not intended to replace advice given to you by your health care provider. Make sure you discuss any questions you have with your health care provider. Document Revised: 10/12/2019 Document  Reviewed: 10/12/2019 Elsevier Patient Education  [place].

## 2022-03-10 NOTE — Assessment & Plan Note (Signed)
Compliant with medicines and under controlled even A1C is up and down looking at the trend of past 2 years, mostly hanging in 7's. Will recheck lab in 1 week

## 2022-03-10 NOTE — Assessment & Plan Note (Signed)
Taking Calcium, vitamin D and Fosamax  Refer for Dexa scan/ will follow up with that

## 2022-03-11 DIAGNOSIS — M810 Age-related osteoporosis without current pathological fracture: Secondary | ICD-10-CM | POA: Diagnosis not present

## 2022-03-14 DIAGNOSIS — E1169 Type 2 diabetes mellitus with other specified complication: Secondary | ICD-10-CM

## 2022-03-14 DIAGNOSIS — Z794 Long term (current) use of insulin: Secondary | ICD-10-CM

## 2022-03-14 DIAGNOSIS — E782 Mixed hyperlipidemia: Secondary | ICD-10-CM

## 2022-03-14 DIAGNOSIS — F339 Major depressive disorder, recurrent, unspecified: Secondary | ICD-10-CM

## 2022-03-15 DIAGNOSIS — R32 Unspecified urinary incontinence: Secondary | ICD-10-CM | POA: Diagnosis not present

## 2022-03-17 ENCOUNTER — Ambulatory Visit: Payer: Medicare HMO | Admitting: Nurse Practitioner

## 2022-03-17 DIAGNOSIS — I10 Essential (primary) hypertension: Secondary | ICD-10-CM | POA: Insufficient documentation

## 2022-03-17 NOTE — Progress Notes (Deleted)
Subjective:  Patient ID: Rebecca Hodge, female    DOB: 11/22/1940  Age: 82 y.o. MRN: [id number]  CHIEF COMPLAINT: Bilateral shoulder blades pain Lower abdomen pain HPI: Patient is her for ongoing rotator cuff pain, although this time she does not have any pain but she is concerned it is bothering her lately.She wants to go to physical therapy if possible as she has a good experience with them in the past.  she is having some pain in lower abdomen. She has a mixed incontinence history. Patient mentioned she could tolerate Macrobid in past without any side effects. She wants DEXA scan to be done as she is concerned about her ongoing osteoporosis and she wants to check her urine. Denies Dysuria and hematuria but complaints of lower abdominal pain.    Current Outpatient Medications on File Prior to Visit  Medication Sig Dispense Refill   ACCU-CHEK GUIDE test strip 1 each by Other route daily. Check blood sugar fasting in the morning daily. 100 each 2   alendronate (FOSAMAX) 70 MG tablet TAKE 1 TABLET BY MOUTH WEEKLY ON THE SAME DAY EACH WEEK, WITH A FULL GLASS OF WATER AT LEAST 30 MINUTES BEFORE FIRST FOOD OR BEVERAGE OF THE DAY. REMAIN UPRIGHT FOR AT LEAST 30 MINUTES AFTER TAKING 12 tablet 1   BD PEN NEEDLE NANO U/F 32G X 4 MM MISC See admin instructions.     Blood Glucose Monitoring Suppl (ACCU-CHEK GUIDE) w/Device KIT 1 each by Other route daily. 1 kit 0   diclofenac Sodium (VOLTAREN) 1 % GEL Apply 4 g topically 4 (four) times daily. 4 g 1   estradiol (ESTRACE) 0.1 MG/GM vaginal cream INSERT 1 APPLICATORFUL VAGINALLY 3 TIMES A WEEK 42.5 g 3   ezetimibe (ZETIA) 10 MG tablet TAKE 1 TABLET(10 MG) BY MOUTH DAILY 90 tablet 2   gabapentin (NEURONTIN) 300 MG capsule Take 1 capsule (300 mg total) by mouth 3 (three) times daily. 270 capsule 2   insulin detemir (LEVEMIR FLEXTOUCH) 100 UNIT/ML FlexPen Inject 12 Units into the skin daily. INJECT 14 UNITS SUBCUTANEOUSLY EVERY NIGHT AT BEDTIME 15 mL 2    Lancets (ONETOUCH ULTRASOFT) lancets 1 each by Other route daily. Use as instructed 100 each 2   liraglutide (VICTOZA) 18 MG/3ML SOPN INJECT 0.2 MILLILITERS (1.2 MG TOTAL) UNDER THE SKIN DAILY. 6 mL 6   lisinopril (ZESTRIL) 20 MG tablet Take 20 mg by mouth daily.     metoprolol tartrate (LOPRESSOR) 25 MG tablet Take 1 tablet (25 mg total) by mouth 2 (two) times daily. 180 tablet 2   mirtazapine (REMERON) 15 MG tablet TAKE 1 TABLET(15 MG) BY MOUTH AT BEDTIME 30 tablet 3   mupirocin ointment (BACTROBAN) 2 % Apply 1 Application topically 2 (two) times daily. 22 g 2   nitrofurantoin (MACRODANTIN) 50 MG capsule TAKE 1 CAPSULE(50 MG) BY MOUTH AT BEDTIME 90 capsule 2   pantoprazole (PROTONIX) 40 MG tablet Take 1 tablet (40 mg total) by mouth daily. 90 tablet 2   rosuvastatin (CRESTOR) 40 MG tablet Take 1 tablet (40 mg total) by mouth at bedtime. 90 tablet 2   Vitamin D, Ergocalciferol, (DRISDOL) 1.25 MG (50000 UNIT) CAPS capsule Take 1 capsule (50,000 Units total) by mouth every 7 (seven) days. 12 capsule 6   No current facility-administered medications on file prior to visit.   Past Medical History:  Diagnosis Date   Age-related osteoporosis without current pathological fracture 06/18/2019   Allergic rhinitis    BMI 27.0-27.9,adult 08/17/2019  Diabetes ([place])    Dysphasia following cerebral infarction 06/18/2019   Hyperlipidemia    Hypertension    Mixed hyperlipidemia 06/18/2019   Mixed incontinence 06/18/2019   Sequelae of poliomyelitis 06/18/2019   Type 2 diabetes mellitus with other specified complication ([place]) 08/20/1243   Past Surgical History:  Procedure Laterality Date   ABDOMINAL HYSTERECTOMY     CATARACT EXTRACTION Right    CHOLECYSTECTOMY     ROTATOR CUFF REPAIR Bilateral     No family history on file. Social History   Socioeconomic History   Marital status: Widowed    Spouse name: Not on file   Number of children: Not on file   Years of education: Not on file   Highest education  level: Not on file  Occupational History   Occupation: Disabled  Tobacco Use   Smoking status: Former    Packs/day: 1.00    Years: 5.00    Total pack years: 5.00    Types: Cigarettes    Quit date: 03/16/1987    Years since quitting: 35.0   Smokeless tobacco: Never  Substance and Sexual Activity   Alcohol use: No   Drug use: No   Sexual activity: Not Currently  Other Topics Concern   Not on file  Social History Narrative   Not on file   Social Determinants of Health   Financial Resource Strain: Low Risk  (02/24/2022)   Overall Financial Resource Strain (CARDIA)    Difficulty of Paying Living Expenses: Not hard at all  Food Insecurity: Not on file  Transportation Needs: No Transportation Needs (02/24/2022)   PRAPARE - Hydrologist (Medical): No    Lack of Transportation (Non-Medical): No  Physical Activity: Not on file  Stress: Not on file  Social Connections: Not on file    Review of Systems  Constitutional:  Negative for chills and fatigue.  HENT:  Negative for congestion, ear pain, sinus pain and sore throat.   Respiratory:  Negative for cough and shortness of breath.   Cardiovascular:  Negative for chest pain and leg swelling.  Gastrointestinal:  Negative for abdominal pain, constipation, diarrhea, nausea and vomiting.  Genitourinary:  Positive for enuresis and urgency. Negative for dysuria, flank pain and frequency.       Lower abdominal pain  Musculoskeletal:  Negative for arthralgias, back pain and myalgias.  Neurological:  Positive for light-headedness. Negative for dizziness and headaches.     Objective:  LMP  (LMP Unknown)      03/10/2022    9:16 AM 02/23/2022   10:03 AM 01/20/2022   10:12 AM  BP/Weight  Systolic BP 809 94 983  Diastolic BP 70 60 60  Wt. (Lbs) 132 134 130  BMI 22.66 kg/m2 23 kg/m2 22.31 kg/m2    Physical Exam Vitals reviewed.  Constitutional:      Appearance: Normal appearance. She is normal weight.   Neck:     Vascular: No carotid bruit.  Cardiovascular:     Rate and Rhythm: Normal rate and regular rhythm.     Heart sounds: Normal heart sounds.  Pulmonary:     Effort: Pulmonary effort is normal.     Breath sounds: Normal breath sounds.  Abdominal:     General: Abdomen is flat. Bowel sounds are normal.     Palpations: Abdomen is soft.     Tenderness: There is no abdominal tenderness.  Musculoskeletal:        General: Tenderness present. No swelling, deformity or signs of  injury.     Cervical back: Normal range of motion.     Comments: H/o bilateral rotator cuff surgery  Skin:    General: Skin is dry.  Neurological:     Mental Status: She is alert and oriented to person, place, and time.  Psychiatric:        Mood and Affect: Mood normal.        Behavior: Behavior normal.        Thought Content: Thought content normal.        Judgment: Judgment normal.      Lab Results  Component Value Date   WBC 8.4 10/29/2021   HGB 13.8 10/29/2021   HCT 45.4 10/29/2021   PLT 266 10/29/2021   GLUCOSE 75 10/29/2021   CHOL 155 10/29/2021   TRIG 112 10/29/2021   HDL 58 10/29/2021   LDLCALC 77 10/29/2021   ALT 17 10/29/2021   AST 20 10/29/2021   NA 144 10/29/2021   K 4.1 10/29/2021   CL 105 10/29/2021   CREATININE 0.90 10/29/2021   BUN 10 10/29/2021   CO2 25 10/29/2021   HGBA1C 7.4 (H) 10/29/2021   MICROALBUR 30 07/21/2020   Assessment & Plan:    Problem List Items Addressed This Visit   None    Follow-up:  in 1 week to see the effect of medicine, follow up Dexa scan result and lab work.  I, Neil Crouch have reviewed all documentation for this visit. The documentation on 03/17/22   for the exam, diagnosis, procedures, and orders are all accurate and complete.      An After Visit Summary was printed and given to the patient.  Neil Crouch, [place] [phone]

## 2022-03-17 NOTE — Assessment & Plan Note (Deleted)
Patient had Dexa Scan recently 12/28, mean T-score is -2.9 Patient was prescribed  Fosamax before by Dr. Henrene Pastor, patient said patient could not tolerated Added Boniva Suggested OTC calcium citrate and vitamin D3

## 2022-03-22 ENCOUNTER — Ambulatory Visit (INDEPENDENT_AMBULATORY_CARE_PROVIDER_SITE_OTHER): Payer: Medicare HMO | Admitting: Nurse Practitioner

## 2022-03-22 ENCOUNTER — Encounter: Payer: Self-pay | Admitting: Nurse Practitioner

## 2022-03-22 VITALS — BP 120/70 | HR 51 | Temp 97.0°F | Resp 16 | Ht 64.0 in | Wt 134.6 lb

## 2022-03-22 DIAGNOSIS — Z794 Long term (current) use of insulin: Secondary | ICD-10-CM | POA: Diagnosis not present

## 2022-03-22 DIAGNOSIS — E1165 Type 2 diabetes mellitus with hyperglycemia: Secondary | ICD-10-CM

## 2022-03-22 DIAGNOSIS — E1169 Type 2 diabetes mellitus with other specified complication: Secondary | ICD-10-CM

## 2022-03-22 DIAGNOSIS — M25519 Pain in unspecified shoulder: Secondary | ICD-10-CM | POA: Diagnosis not present

## 2022-03-22 DIAGNOSIS — M810 Age-related osteoporosis without current pathological fracture: Secondary | ICD-10-CM | POA: Diagnosis not present

## 2022-03-22 DIAGNOSIS — Z9889 Other specified postprocedural states: Secondary | ICD-10-CM

## 2022-03-22 DIAGNOSIS — N3000 Acute cystitis without hematuria: Secondary | ICD-10-CM

## 2022-03-22 MED ORDER — IBANDRONATE SODIUM 150 MG PO TABS: 150.0000 mg | ORAL_TABLET | ORAL | 0 refills | Status: DC

## 2022-03-22 MED ORDER — LEVEMIR FLEXTOUCH 100 UNIT/ML ~~LOC~~ SOPN: 12.0000 [IU] | PEN_INJECTOR | Freq: Every day | SUBCUTANEOUS | 2 refills | Status: DC

## 2022-03-22 MED ORDER — DICLOFENAC SODIUM 1 % EX GEL: 4.0000 g | Freq: Four times a day (QID) | CUTANEOUS | 1 refills | Status: DC

## 2022-03-22 NOTE — Assessment & Plan Note (Signed)
Resolved

## 2022-03-22 NOTE — Assessment & Plan Note (Signed)
Diclofenac Gel apply as needed Take tylenol as needed, heat and cold compress and OTC lidocaine patch as needed shoulder exercises recommended Ambulatory referral to physical therapy is pending

## 2022-03-22 NOTE — Assessment & Plan Note (Signed)
She has gone through Dexa scan recently and her T-score is -2.9, In past she was prescribed Fosamax but she was not able to take it considering its too frequent to swallow, big and hoarse, she just took Vitamin D capsules once every week. She wants to try something else if possible. Started on Vitamin D3 with calcium citrate over the counter  Started on Boniva 150 mg every month Encouraged to drink milk and calcium rich foods Will follow up with Dexa scan after 2 years.

## 2022-03-22 NOTE — Progress Notes (Addendum)
Subjective:  Patient ID: Rebecca Hodge, female    DOB: [birth year]  Age: 82 y.o. MRN: [id number]  Chief Complaints:   Follow up to discuss recent Dexa Scan results and UTI  History of Present illness:  Rebecca Hodge is here today for a follow up on UTI.  She states that she feels much better and denies any symptoms as she finish the course of antibiotics. She also had a complain of shoulder blade and back pain and she said the Diclofenac gel is working for that and she wants to refill that medicine. She has gone through Dexa scan recently and her T-score is -2.9, In past she was prescribed Fosamax but she was not able to take it considering its too frequent to swallow, big and hoarse, she just took Vitamin D capsules every week. She wants to try something else if possible. She said she is not fasting today for lab work and wants to come back next week for fasting lab works and chronic follow up.   Current Outpatient Medications on File Prior to Visit  Medication Sig Dispense Refill   ACCU-CHEK GUIDE test strip 1 each by Other route daily. Check blood sugar fasting in the morning daily. 100 each 2   BD PEN NEEDLE NANO U/F 32G X 4 MM MISC See admin instructions.     Blood Glucose Monitoring Suppl (ACCU-CHEK GUIDE) w/Device KIT 1 each by Other route daily. 1 kit 0   estradiol (ESTRACE) 0.1 MG/GM vaginal cream INSERT 1 APPLICATORFUL VAGINALLY 3 TIMES A WEEK 42.5 g 3   gabapentin (NEURONTIN) 300 MG capsule Take 1 capsule (300 mg total) by mouth 3 (three) times daily. 270 capsule 2   Lancets (ONETOUCH ULTRASOFT) lancets 1 each by Other route daily. Use as instructed 100 each 2   liraglutide (VICTOZA) 18 MG/3ML SOPN INJECT 0.2 MILLILITERS (1.2 MG TOTAL) UNDER THE SKIN DAILY. 6 mL 6   lisinopril (ZESTRIL) 20 MG tablet Take 20 mg by mouth daily.     metoprolol tartrate (LOPRESSOR) 25 MG tablet Take 1 tablet (25 mg total) by mouth 2 (two) times daily. 180 tablet 2   mupirocin ointment (BACTROBAN) 2 %  Apply 1 Application topically 2 (two) times daily. 22 g 2   nitrofurantoin (MACRODANTIN) 50 MG capsule TAKE 1 CAPSULE(50 MG) BY MOUTH AT BEDTIME 90 capsule 2   Vitamin D, Ergocalciferol, (DRISDOL) 1.25 MG (50000 UNIT) CAPS capsule Take 1 capsule (50,000 Units total) by mouth every 7 (seven) days. 12 capsule 6   No current facility-administered medications on file prior to visit.   Past Medical History:  Diagnosis Date   Age-related osteoporosis without current pathological fracture 06/18/2019   Allergic rhinitis    BMI 27.0-27.9,adult 08/17/2019   Diabetes ([place])    Dysphasia following cerebral infarction 06/18/2019   Hyperlipidemia    Hypertension    Mixed hyperlipidemia 06/18/2019   Mixed incontinence 06/18/2019   Sequelae of poliomyelitis 06/18/2019   Type 2 diabetes mellitus with other specified complication ([place]) 123456   Past Surgical History:  Procedure Laterality Date   ABDOMINAL HYSTERECTOMY     CATARACT EXTRACTION Right    CHOLECYSTECTOMY     ROTATOR CUFF REPAIR Bilateral     History reviewed. No pertinent family history. Social History   Socioeconomic History   Marital status: Widowed    Spouse name: Not on file   Number of children: Not on file   Years of education: Not on file   Highest education level: Not on  file  Occupational History   Occupation: Disabled  Tobacco Use   Smoking status: Former    Packs/day: 1.00    Years: 5.00    Total pack years: 5.00    Types: Cigarettes    Quit date: 03/16/1987    Years since quitting: 35.1   Smokeless tobacco: Never  Substance and Sexual Activity   Alcohol use: No   Drug use: No   Sexual activity: Not Currently  Other Topics Concern   Not on file  Social History Narrative   Not on file   Social Determinants of Health   Financial Resource Strain: Low Risk  (02/24/2022)   Overall Financial Resource Strain (CARDIA)    Difficulty of Paying Living Expenses: Not hard at all  Food Insecurity: Not on file  Transportation  Needs: No Transportation Needs (02/24/2022)   PRAPARE - Hydrologist (Medical): No    Lack of Transportation (Non-Medical): No  Physical Activity: Not on file  Stress: Not on file  Social Connections: Not on file    Review of Systems  Constitutional:  Negative for chills and fatigue.  HENT:  Negative for congestion, ear pain, sinus pain and sore throat.   Respiratory:  Negative for cough and shortness of breath.   Cardiovascular:  Negative for chest pain and leg swelling.  Gastrointestinal:  Negative for abdominal pain, constipation, diarrhea, nausea and vomiting.  Genitourinary:  Negative for dysuria and frequency.  Musculoskeletal:  Negative for arthralgias, back pain and myalgias.  Neurological:  Positive for dizziness. Negative for headaches.     Objective:  BP 120/70   Pulse (!) 51   Temp (!) 97 F (36.1 C)   Resp 16   Ht '5\' 4"'$  (1.626 m)   Wt 134 lb 9.6 oz (61.1 kg)   LMP  (LMP Unknown)   SpO2 100%   BMI 23.10 kg/m      03/31/2022   10:53 AM 03/29/2022    7:35 AM 03/22/2022    1:37 PM  BP/Weight  Systolic BP 0000000 [id number]  Diastolic BP 70 80 70  Wt. (Lbs) 132.6 132 134.6  BMI 24.25 kg/m2 24.14 kg/m2 23.1 kg/m2    Physical Exam Vitals reviewed.  Constitutional:      Appearance: Normal appearance. She is normal weight.  Neck:     Vascular: No carotid bruit.  Cardiovascular:     Rate and Rhythm: Normal rate and regular rhythm.     Heart sounds: Normal heart sounds.  Pulmonary:     Effort: Pulmonary effort is normal.     Breath sounds: Normal breath sounds.  Abdominal:     General: Abdomen is flat. Bowel sounds are normal.     Palpations: Abdomen is soft.     Tenderness: There is no abdominal tenderness.  Musculoskeletal:     Cervical back: Normal range of motion.  Neurological:     Mental Status: She is alert and oriented to person, place, and time.  Psychiatric:        Mood and Affect: Mood normal.        Behavior: Behavior  normal.    Lab Results  Component Value Date   WBC 7.6 03/29/2022   HGB 14.6 03/29/2022   HCT 46.5 03/29/2022   PLT 212 03/29/2022   GLUCOSE 123 (H) 03/29/2022   CHOL 184 03/29/2022   TRIG 98 03/29/2022   HDL 68 03/29/2022   LDLCALC 98 03/29/2022   ALT 33 (H) 03/29/2022   AST  40 03/29/2022   NA 143 03/29/2022   K 4.7 03/29/2022   CL 104 03/29/2022   CREATININE 0.85 03/29/2022   BUN 15 03/29/2022   CO2 24 03/29/2022   TSH 2.080 03/29/2022   HGBA1C 7.4 (H) 03/29/2022   MICROALBUR 30 07/21/2020      Assessment & Plan:   Problem List Items Addressed This Visit     Age-related osteoporosis without current pathological fracture - Primary    She has gone through Dexa scan recently and her T-score is -2.9, In past she was prescribed Fosamax but she was not able to take it considering its too frequent to swallow, big and hoarse, she just took Vitamin D capsules once every week. She wants to try something else if possible. Started on Vitamin D3 with calcium citrate over the counter  Started on Boniva 150 mg every month Encouraged to drink milk and calcium rich foods Will follow up with Dexa scan after 2 years.      Relevant Medications   ibandronate (BONIVA) 150 MG tablet   Shoulder pain with history of repair of rotator cuff    Diclofenac Gel apply as needed Take tylenol as needed, heat and cold compress and OTC lidocaine patch as needed shoulder exercises recommended Ambulatory referral to physical therapy is pending      Relevant Medications   ibandronate (BONIVA) 150 MG tablet   diclofenac Sodium (VOLTAREN) 1 % GEL   Type 2 diabetes mellitus with hyperglycemia, with long-term current use of insulin (HCC)    Using insulin Levemir 14 unit daily      Urinary tract infection without hematuria    Resolved        Follow-up: 03/29/2022 for fasting     I, Palma Holter Olga Seyler have reviewed all documentation for this visit. The documentation on 05/14/22   for the exam,  diagnosis, procedures, and orders are all accurate and complete.        An After Visit Summary was printed and given to the patient.  Neil Crouch, DNP, [place] [phone]

## 2022-03-22 NOTE — Patient Instructions (Addendum)
Please take Vitamin D3 with calcium citrate over the counter every day  Follow up for chronic fasting Monday 03/29/2022    Ibandronate Monthly Tablets What is this medication? IBANDRONATE (i BAN droh nate) prevents and treats osteoporosis. It works by Paramedic stronger and less likely to break (fracture). It belongs to a group of medications called bisphosphonates. This medicine may be used for other purposes; ask your health care provider or pharmacist if you have questions. COMMON BRAND NAME(S): Boniva What should I tell my care team before I take this medication? They need to know if you have any of these conditions: Bleeding disorder Cancer Dental disease Difficulty swallowing Kidney disease Low levels of calcium or other minerals in the blood Low red blood cell level Receiving steroids, such as dexamethasone or prednisone Stomach or intestine problems An unusual or allergic reaction to ibandronate, other medications, foods, dyes, or preservatives Pregnant or trying to get pregnant Breast-feeding How should I use this medication? Take this medication by mouth with a full glass of water. Take it as directed on the prescription label on the same day of each month.Take the dose right after waking up. Do not eat or drink anything before taking it. Do not take it with any other drink except water. Do not chew or crush the tablet. After taking it, do not eat breakfast, drink, or take any other drugs or vitamins for at least 30 minutes. Sit or stand up for at least 60 minutes after you take it. Do not lie down. Keep taking it unless your health care provider tells you to stop. A special MedGuide will be given to you by the pharmacist with each prescription and refill. Be sure to read this information carefully each time. Talk to your care team about the use of this medication in children. Special care may be needed. Overdosage: If you think you have taken too much of this medicine  contact a poison control center or emergency room at once. NOTE: This medicine is only for you. Do not share this medicine with others. What if I miss a dose? If you miss a dose and the next scheduled dose is more than 7 days away, take the missed dose on the morning after you remember. Do not take two doses on the same day. If you miss a dose and the next scheduled dose is only 1 to 7 days away, skip it. Take the next dose on the morning of the next scheduled dose. Do not take two doses on the same day. What may interact with this medication? Aluminum hydroxide Antacids Aspirin Calcium supplements Medications for inflammation, such as ibuprofen, naproxen, other NSAIDs Iron supplements Magnesium supplements Vitamins with minerals This list may not describe all possible interactions. Give your health care provider a list of all the medicines, herbs, non-prescription drugs, or dietary supplements you use. Also tell them if you smoke, drink alcohol, or use illegal drugs. Some items may interact with your medicine. What should I watch for while using this medication? Visit your care team for regular checks on your progress. It may be some time before you see the benefit from this medication. Some people who take this medication have severe bone, joint, or muscle pain. This medication may also increase your risk for jaw problems or a broken thigh bone. Tell your care team right away if you have severe pain in your jaw, bones, joints, or muscles. Tell your care team if you have any pain that does not  go away or that gets worse. Tell your dentist and dental surgeon that you are taking this medication. You should not have major dental surgery while on this medication. See your dentist to have a dental exam and fix any dental problems before starting this medication. Take good care of your teeth while on this medication. Make sure you see your dentist for regular follow-up appointments. You should make  sure you get enough calcium and vitamin D while you are taking this medication. Discuss the foods you eat and the vitamins you take with your care team. You may need bloodwork while taking this medication. What side effects may I notice from receiving this medication? Side effects that you should report to your care team as soon as possible: Allergic reactions--skin rash, itching, hives, swelling of the face, lips, tongue, or throat Low calcium level--muscle pain or cramps, confusion, tingling, or numbness in the hands or feet Osteonecrosis of the jaw--pain, swelling, or redness in the mouth, numbness of the jaw, poor healing after dental work, unusual discharge from the mouth, visible bones in the mouth Pain or trouble swallowing Severe bone, joint, or muscle pain Stomach bleeding--bloody or black, tar-like stools, vomiting blood or brown material that looks like coffee grounds Side effects that usually do not require medical attention (report to your care team if they continue or are bothersome): Back pain Diarrhea Headache Nausea Stomach pain This list may not describe all possible side effects. Call your doctor for medical advice about side effects. You may report side effects to FDA at 1-800-FDA-1088. Where should I keep my medication? Keep out of the reach of children and pets. Store between 15 and 30 degrees C (59 and 86 degrees F). Get rid of any unused medication after the expiration date. To get rid of medications that are no longer needed or have expired: Take the medication to a medication take-back program. Check with your pharmacy or law enforcement to find a location. If you cannot return the medication, check the label or package insert to see if the medication should be thrown out in the garbage or flushed down the toilet. If you are not sure, ask your care team. If it is safe to put it in the trash, pour the medication out of the container. Mix the medication with cat litter,  dirt, coffee grounds, or other unwanted substance. Seal the mixture in a bag or container. Put it in the trash. NOTE: This sheet is a summary. It may not cover all possible information. If you have questions about this medicine, talk to your doctor, pharmacist, or health care provider.  2023 Elsevier/Gold Standard (2021-01-30 00:00:00)

## 2022-03-27 NOTE — Progress Notes (Unsigned)
Subjective:  Patient ID: Rebecca Hodge, female    DOB: September 14, 1940  Age: 82 y.o. MRN: [id number]  Chief Complaints:  Chronic follow up and right sided rib pain  History of Present illness: Patient is here for a chronic folow up for Dm2, HYPERTENSION and hyperlipidemia. She is very active for her age and does regular dog walking and exercise. Her daughter is taking good care of her. She complaints of right rib pain on today's visit. According to patient's daughter patient had a fall long time ago on that spot and it bothers every now and then. She is seeking some medicine for that for her mother today. She is fasting for lab and willing to have flu shot too.   For her HYPERTENSION: She was last seen for hypertension 1 weeks ago.  BP at that visit was 120/70. Management includes lisinopril 20 mg daily and metoprolol 25 mg, two times daily.  She reports good compliance with treatment. She is not having side effects. She is following a Regular diet. She is exercising.walking dog. Doing house chores She does not smoke. Use of agents associated with hypertension: None.    Pertinent labs: Lab Results  Component Value Date   CHOL 155 10/29/2021   HDL 58 10/29/2021   LDLCALC 77 10/29/2021   TRIG 112 10/29/2021   CHOLHDL 2.7 10/29/2021   Lab Results  Component Value Date   NA 144 10/29/2021   K 4.1 10/29/2021   CREATININE 0.90 10/29/2021   EGFR 64 10/29/2021   GFRNONAA 50 (L) 12/24/2019   GLUCOSE 75 10/29/2021     The ASCVD Risk score (Arnett DK, et al., 2019) failed to calculate for the following reasons:   The 2019 ASCVD risk score is only valid for ages 49 to 61    Diabetes Mellitus Type II, Follow-up  Lab Results  Component Value Date   HGBA1C 7.4 (H) 10/29/2021   HGBA1C 7.6 (H) 06/22/2021   HGBA1C 7.7 (H) 02/16/2021   Wt Readings from Last 3 Encounters:  03/29/22 132 lb (59.9 kg)  03/22/22 134 lb 9.6 oz (61.1 kg)  03/10/22 132 lb (59.9 kg)   Last seen for  diabetes 1 weeks ago.  Management  includes victoza, levemir. She reports good compliance with treatment. She is not having side effects.  Home blood sugar records: fasting range: 100-110  Episodes of hypoglycemia? No   Current insulin regiment:Victoza: INJECT 0.2 MILLILITERS (1.2 MG TOTAL) UNDER THE SKIN DAILY Levemir 12 units at bed time  Most Recent Eye Exam: 3 months ago Current exercise: walking Current diet habits: well balanced  Pertinent Labs: Lab Results  Component Value Date   CHOL 155 10/29/2021   HDL 58 10/29/2021   LDLCALC 77 10/29/2021   TRIG 112 10/29/2021   CHOLHDL 2.7 10/29/2021   Lab Results  Component Value Date   NA 144 10/29/2021   K 4.1 10/29/2021   CREATININE 0.90 10/29/2021   EGFR 64 10/29/2021   GFRNONAA 50 (L) 12/24/2019   GLUCOSE 75 10/29/2021       Lipid/Cholesterol, Follow-up  Last lipid panel Other pertinent labs  Lab Results  Component Value Date   CHOL 155 10/29/2021   HDL 58 10/29/2021   LDLCALC 77 10/29/2021   TRIG 112 10/29/2021   CHOLHDL 2.7 10/29/2021   Lab Results  Component Value Date   ALT 17 10/29/2021   AST 20 10/29/2021   PLT 266 10/29/2021     She was last seen for this 1 weeks  ago.  Management includes crestor 40 mg daily at bed time  She reports good compliance with treatment. She is not having side effects Current diet: well balanced Current exercise: walking  The ASCVD Risk score (Arnett DK, et al., 2019) failed to calculate for the following reasons:   The 2019 ASCVD risk score is only valid for ages 84 to 52    Current Outpatient Medications on File Prior to Visit  Medication Sig Dispense Refill   ACCU-CHEK GUIDE test strip 1 each by Other route daily. Check blood sugar fasting in the morning daily. 100 each 2   BD PEN NEEDLE NANO U/F 32G X 4 MM MISC See admin instructions.     Blood Glucose Monitoring Suppl (ACCU-CHEK GUIDE) w/Device KIT 1 each by Other route daily. 1 kit 0   diclofenac Sodium  (VOLTAREN) 1 % GEL Apply 4 g topically 4 (four) times daily. 4 g 1   estradiol (ESTRACE) 0.1 MG/GM vaginal cream INSERT 1 APPLICATORFUL VAGINALLY 3 TIMES A WEEK 42.5 g 3   gabapentin (NEURONTIN) 300 MG capsule Take 1 capsule (300 mg total) by mouth 3 (three) times daily. 270 capsule 2   ibandronate (BONIVA) 150 MG tablet Take 1 tablet (150 mg total) by mouth every 30 (thirty) days. Take in the morning with a full glass of water, on an empty stomach, and do not take anything else by mouth or lie down for the next 30 min. 12 tablet 0   insulin detemir (LEVEMIR FLEXTOUCH) 100 UNIT/ML FlexPen Inject 12 Units into the skin daily. INJECT 14 UNITS SUBCUTANEOUSLY EVERY NIGHT AT BEDTIME 15 mL 2   Lancets (ONETOUCH ULTRASOFT) lancets 1 each by Other route daily. Use as instructed 100 each 2   liraglutide (VICTOZA) 18 MG/3ML SOPN INJECT 0.2 MILLILITERS (1.2 MG TOTAL) UNDER THE SKIN DAILY. 6 mL 6   lisinopril (ZESTRIL) 20 MG tablet Take 20 mg by mouth daily.     metoprolol tartrate (LOPRESSOR) 25 MG tablet Take 1 tablet (25 mg total) by mouth 2 (two) times daily. 180 tablet 2   mupirocin ointment (BACTROBAN) 2 % Apply 1 Application topically 2 (two) times daily. 22 g 2   nitrofurantoin (MACRODANTIN) 50 MG capsule TAKE 1 CAPSULE(50 MG) BY MOUTH AT BEDTIME 90 capsule 2   pantoprazole (PROTONIX) 40 MG tablet Take 1 tablet (40 mg total) by mouth daily. 90 tablet 2   rosuvastatin (CRESTOR) 40 MG tablet Take 1 tablet (40 mg total) by mouth at bedtime. 90 tablet 2   Vitamin D, Ergocalciferol, (DRISDOL) 1.25 MG (50000 UNIT) CAPS capsule Take 1 capsule (50,000 Units total) by mouth every 7 (seven) days. 12 capsule 6   No current facility-administered medications on file prior to visit.   Past Medical History:  Diagnosis Date   Age-related osteoporosis without current pathological fracture 06/18/2019   Allergic rhinitis    BMI 27.0-27.9,adult 08/17/2019   Diabetes ([place])    Dysphasia following cerebral infarction  06/18/2019   Hyperlipidemia    Hypertension    Mixed hyperlipidemia 06/18/2019   Mixed incontinence 06/18/2019   Sequelae of poliomyelitis 06/18/2019   Type 2 diabetes mellitus with other specified complication ([place]) 06/14/6832   Past Surgical History:  Procedure Laterality Date   ABDOMINAL HYSTERECTOMY     CATARACT EXTRACTION Right    CHOLECYSTECTOMY     ROTATOR CUFF REPAIR Bilateral     History reviewed. No pertinent family history. Social History   Socioeconomic History   Marital status: Widowed    Spouse  name: Not on file   Number of children: Not on file   Years of education: Not on file   Highest education level: Not on file  Occupational History   Occupation: Disabled  Tobacco Use   Smoking status: Former    Packs/day: 1.00    Years: 5.00    Total pack years: 5.00    Types: Cigarettes    Quit date: 03/16/1987    Years since quitting: 35.0   Smokeless tobacco: Never  Substance and Sexual Activity   Alcohol use: No   Drug use: No   Sexual activity: Not Currently  Other Topics Concern   Not on file  Social History Narrative   Not on file   Social Determinants of Health   Financial Resource Strain: Low Risk  (02/24/2022)   Overall Financial Resource Strain (CARDIA)    Difficulty of Paying Living Expenses: Not hard at all  Food Insecurity: Not on file  Transportation Needs: No Transportation Needs (02/24/2022)   PRAPARE - Hydrologist (Medical): No    Lack of Transportation (Non-Medical): No  Physical Activity: Not on file  Stress: Not on file  Social Connections: Not on file    Review of Systems  Constitutional:  Negative for chills and fatigue.  HENT:  Negative for congestion, ear pain, sinus pain and sore throat.   Respiratory:  Negative for cough and shortness of breath.   Cardiovascular:  Negative for chest pain and leg swelling.  Gastrointestinal:  Negative for abdominal pain, constipation, diarrhea, nausea and vomiting.   Genitourinary:  Negative for dysuria and frequency.  Musculoskeletal:  Positive for arthralgias (right side rib pain). Negative for back pain and myalgias.  Neurological:  Negative for dizziness and headaches.  Psychiatric/Behavioral:  Negative for confusion.     Objective:  BP 120/80   Pulse 60   Temp (!) 97.2 F (36.2 C)   Resp 16   Ht '5\' 2"'$  (1.575 m)   Wt 132 lb (59.9 kg)   LMP  (LMP Unknown)   SpO2 95%   BMI 24.14 kg/m      03/29/2022    7:35 AM 03/22/2022    1:37 PM 03/10/2022    9:16 AM  BP/Weight  Systolic BP 253 664 403  Diastolic BP 80 70 70  Wt. (Lbs) 132 134.6 132  BMI 24.14 kg/m2 23.1 kg/m2 22.66 kg/m2    Physical Exam Vitals reviewed.  Constitutional:      Appearance: Normal appearance. She is normal weight.  Neck:     Vascular: No carotid bruit.  Cardiovascular:     Rate and Rhythm: Normal rate and regular rhythm.     Heart sounds: Normal heart sounds.  Pulmonary:     Effort: Pulmonary effort is normal.     Breath sounds: Normal breath sounds.  Abdominal:     General: Abdomen is flat. Bowel sounds are normal.     Palpations: Abdomen is soft.     Tenderness: There is no abdominal tenderness.  Musculoskeletal:        General: Tenderness present. No swelling, deformity or signs of injury.     Cervical back: Normal range of motion.     Right lower leg: No edema.     Left lower leg: No edema.     Comments: Right sided rib pain  Neurological:     Mental Status: She is alert and oriented to person, place, and time.  Psychiatric:  Mood and Affect: Mood normal.        Behavior: Behavior normal.    Diabetic Foot Exam - Simple   Simple Foot Form Diabetic Foot exam was performed with the following findings: Yes 03/29/2022  9:20 AM  Visual Inspection No deformities, no ulcerations, no other skin breakdown bilaterally: Yes Sensation Testing Intact to touch and monofilament testing bilaterally: Yes Pulse Check Posterior Tibialis and Dorsalis  pulse intact bilaterally: Yes Comments     Lab Results  Component Value Date   WBC 8.4 10/29/2021   HGB 13.8 10/29/2021   HCT 45.4 10/29/2021   PLT 266 10/29/2021   GLUCOSE 75 10/29/2021   CHOL 155 10/29/2021   TRIG 112 10/29/2021   HDL 58 10/29/2021   LDLCALC 77 10/29/2021   ALT 17 10/29/2021   AST 20 10/29/2021   NA 144 10/29/2021   K 4.1 10/29/2021   CL 105 10/29/2021   CREATININE 0.90 10/29/2021   BUN 10 10/29/2021   CO2 25 10/29/2021   HGBA1C 7.4 (H) 10/29/2021   MICROALBUR 30 07/21/2020      Assessment & Plan:   Problem List Items Addressed This Visit       Cardiovascular and Mediastinum   Hypertension   Relevant Orders   CBC with Differential/Platelet   Comprehensive metabolic panel   Hemoglobin A1c   Microalbumin / creatinine urine ratio   TSH   Lipid Panel   Hypertension associated with diabetes ([place])    Blood pressure well controlled with lisinopril 20 mg OD and metoprolol 25 mg BD  Congratulated for regular exercise and walking her dog everyday CMP, CBC will be be checked today  Nutrition: Stressed importance of moderation in sodium intake, saturated fat and cholesterol, caloric balance, sufficient intake of complex carbohydrates, fiber, calcium and iron.   Exercise: Stressed the importance of regular exercise.           Respiratory   Seasonal allergic rhinitis    Try to avoid pollens and triggers      Relevant Medications   fluticasone (FLONASE) 50 MCG/ACT nasal spray     Endocrine   Type 2 diabetes mellitus with hyperlipidemia ([place]) - Primary    Well controlled. Trend of A1C is up and down in past 2 years, mostly hanging in 7's  Checking A1C this morning Checking urine microalbumin  Nutrition: Stressed importance of moderation in sodium intake, saturated fat and cholesterol, caloric balance, sufficient intake of complex carbohydrates, fiber, calcium and iron.   Exercise: Stressed the importance of regular exercise.             Musculoskeletal and Integument   Age-related osteoporosis without current pathological fracture   Relevant Orders   CBC with Differential/Platelet     Genitourinary   Postmenopausal atrophic vaginitis     Other   Mixed hyperlipidemia    Well controlled with Crestor 40 mg   Will check lipid profile today  Nutrition: Stressed importance of moderation in sodium intake, saturated fat and cholesterol, caloric balance, sufficient intake of complex carbohydrates, fiber, calcium and iron.   Exercise: Stressed the importance of regular exercise.         Shoulder pain with history of repair of rotator cuff   Rib pain on right side    Take pain medicine and Voltaren gel as prescribed  Will do X-ray if pain does not resolved in one week, patient refused X-ray this time        Relevant Medications   meloxicam (MOBIC) 15  MG tablet   Other Visit Diagnoses     Need for immunization against influenza       Relevant Orders   Flu Vaccine QUAD High Dose(Fluad) (Completed)        Follow-up: in 3 months for chronic fasting and if in case rib pain does not go away  in 7 days with medical management for possible X-ray. I, Neil Crouch have reviewed all documentation for this visit. The documentation on 03/30/22   for the exam, diagnosis, procedures, and orders are all accurate and complete.     An After Visit Summary was printed and given to the patient.  Neil Crouch, DNP, [facility] [phone]

## 2022-03-29 ENCOUNTER — Encounter: Payer: Self-pay | Admitting: Nurse Practitioner

## 2022-03-29 ENCOUNTER — Ambulatory Visit (INDEPENDENT_AMBULATORY_CARE_PROVIDER_SITE_OTHER): Payer: Medicare HMO | Admitting: Nurse Practitioner

## 2022-03-29 VITALS — BP 120/80 | HR 60 | Temp 97.2°F | Resp 16 | Ht 62.0 in | Wt 132.0 lb

## 2022-03-29 DIAGNOSIS — R0781 Pleurodynia: Secondary | ICD-10-CM | POA: Diagnosis not present

## 2022-03-29 DIAGNOSIS — J302 Other seasonal allergic rhinitis: Secondary | ICD-10-CM

## 2022-03-29 DIAGNOSIS — M25519 Pain in unspecified shoulder: Secondary | ICD-10-CM | POA: Diagnosis not present

## 2022-03-29 DIAGNOSIS — E1159 Type 2 diabetes mellitus with other circulatory complications: Secondary | ICD-10-CM | POA: Diagnosis not present

## 2022-03-29 DIAGNOSIS — M810 Age-related osteoporosis without current pathological fracture: Secondary | ICD-10-CM | POA: Diagnosis not present

## 2022-03-29 DIAGNOSIS — Z9889 Other specified postprocedural states: Secondary | ICD-10-CM

## 2022-03-29 DIAGNOSIS — E782 Mixed hyperlipidemia: Secondary | ICD-10-CM

## 2022-03-29 DIAGNOSIS — E1169 Type 2 diabetes mellitus with other specified complication: Secondary | ICD-10-CM | POA: Diagnosis not present

## 2022-03-29 DIAGNOSIS — I10 Essential (primary) hypertension: Secondary | ICD-10-CM | POA: Diagnosis not present

## 2022-03-29 DIAGNOSIS — Z23 Encounter for immunization: Secondary | ICD-10-CM

## 2022-03-29 DIAGNOSIS — I152 Hypertension secondary to endocrine disorders: Secondary | ICD-10-CM

## 2022-03-29 DIAGNOSIS — N952 Postmenopausal atrophic vaginitis: Secondary | ICD-10-CM | POA: Diagnosis not present

## 2022-03-29 DIAGNOSIS — Z794 Long term (current) use of insulin: Secondary | ICD-10-CM

## 2022-03-29 DIAGNOSIS — R0789 Other chest pain: Secondary | ICD-10-CM

## 2022-03-29 DIAGNOSIS — E785 Hyperlipidemia, unspecified: Secondary | ICD-10-CM

## 2022-03-29 MED ORDER — FLUTICASONE PROPIONATE 50 MCG/ACT NA SUSP: 2.0000 | Freq: Every day | NASAL | 6 refills | Status: DC

## 2022-03-29 MED ORDER — MELOXICAM 15 MG PO TABS: 15.0000 mg | ORAL_TABLET | Freq: Every day | ORAL | 0 refills | Status: DC

## 2022-03-29 NOTE — Patient Instructions (Signed)
Coming back in 3 months for fasting   DASH Eating Plan DASH stands for Dietary Approaches to Stop Hypertension. The DASH eating plan is a healthy eating plan that has been shown to: Reduce high blood pressure (hypertension). Reduce your risk for type 2 diabetes, heart disease, and stroke. Help with weight loss. What are tips for following this plan? Reading food labels Check food labels for the amount of salt (sodium) per serving. Choose foods with less than 5 percent of the Daily Value of sodium. Generally, foods with less than 300 milligrams (mg) of sodium per serving fit into this eating plan. To find whole grains, look for the word "whole" as the first word in the ingredient list. Shopping Buy products labeled as "low-sodium" or "no salt added." Buy fresh foods. Avoid canned foods and pre-made or frozen meals. Cooking Avoid adding salt when cooking. Use salt-free seasonings or herbs instead of table salt or sea salt. Check with your health care provider or pharmacist before using salt substitutes. Do not fry foods. Cook foods using healthy methods such as baking, boiling, grilling, roasting, and broiling instead. Cook with heart-healthy oils, such as olive, canola, avocado, soybean, or sunflower oil. Meal planning  Eat a balanced diet that includes: 4 or more servings of fruits and 4 or more servings of vegetables each day. Try to fill one-half of your plate with fruits and vegetables. 6-8 servings of whole grains each day. Less than 6 oz (170 g) of lean meat, poultry, or fish each day. A 3-oz (85-g) serving of meat is about the same size as a deck of cards. One egg equals 1 oz (28 g). 2-3 servings of low-fat dairy each day. One serving is 1 cup (237 mL). 1 serving of nuts, seeds, or beans 5 times each week. 2-3 servings of heart-healthy fats. Healthy fats called omega-3 fatty acids are found in foods such as walnuts, flaxseeds, fortified milks, and eggs. These fats are also found in  cold-water fish, such as sardines, salmon, and mackerel. Limit how much you eat of: Canned or prepackaged foods. Food that is high in trans fat, such as some fried foods. Food that is high in saturated fat, such as fatty meat. Desserts and other sweets, sugary drinks, and other foods with added sugar. Full-fat dairy products. Do not salt foods before eating. Do not eat more than 4 egg yolks a week. Try to eat at least 2 vegetarian meals a week. Eat more home-cooked food and less restaurant, buffet, and fast food. Lifestyle When eating at a restaurant, ask that your food be prepared with less salt or no salt, if possible. If you drink alcohol: Limit how much you use to: 0-1 drink a day for women who are not pregnant. 0-2 drinks a day for men. Be aware of how much alcohol is in your drink. In the [place] one drink equals one 12 oz bottle of beer (355 mL), one 5 oz glass of wine (148 mL), or one 1 oz glass of hard liquor (44 mL). General information Avoid eating more than 2,300 mg of salt a day. If you have hypertension, you may need to reduce your sodium intake to 1,500 mg a day. Work with your health care provider to maintain a healthy body weight or to lose weight. Ask what an ideal weight is for you. Get at least 30 minutes of exercise that causes your heart to beat faster (aerobic exercise) most days of the week. Activities may include walking, swimming, or biking.  Work with your health care provider or dietitian to adjust your eating plan to your individual calorie needs. What foods should I eat? Fruits All fresh, dried, or frozen fruit. Canned fruit in natural juice (without added sugar). Vegetables Fresh or frozen vegetables (raw, steamed, roasted, or grilled). Low-sodium or reduced-sodium tomato and vegetable juice. Low-sodium or reduced-sodium tomato sauce and tomato paste. Low-sodium or reduced-sodium canned vegetables. Grains Whole-grain or whole-wheat bread. Whole-grain or  whole-wheat pasta. Brown rice. [place]. Bulgur. Whole-grain and low-sodium cereals. Pita bread. Low-fat, low-sodium crackers. Whole-wheat flour tortillas. Meats and other proteins Skinless chicken or [place]. Ground chicken or [place]. Pork with fat trimmed off. Fish and seafood. Egg whites. Dried beans, peas, or lentils. Unsalted nuts, nut butters, and seeds. Unsalted canned beans. Lean cuts of beef with fat trimmed off. Low-sodium, lean precooked or cured meat, such as sausages or meat loaves. Dairy Low-fat (1%) or fat-free (skim) milk. Reduced-fat, low-fat, or fat-free cheeses. Nonfat, low-sodium ricotta or cottage cheese. Low-fat or nonfat yogurt. Low-fat, low-sodium cheese. Fats and oils Soft margarine without trans fats. Vegetable oil. Reduced-fat, low-fat, or light mayonnaise and salad dressings (reduced-sodium). Canola, safflower, olive, avocado, soybean, and sunflower oils. Avocado. Seasonings and condiments Herbs. Spices. Seasoning mixes without salt. Other foods Unsalted popcorn and pretzels. Fat-free sweets. The items listed above may not be a complete list of foods and beverages you can eat. Contact a dietitian for more information. What foods should I avoid? Fruits Canned fruit in a light or heavy syrup. Fried fruit. Fruit in cream or butter sauce. Vegetables Creamed or fried vegetables. Vegetables in a cheese sauce. Regular canned vegetables (not low-sodium or reduced-sodium). Regular canned tomato sauce and paste (not low-sodium or reduced-sodium). Regular tomato and vegetable juice (not low-sodium or reduced-sodium). Angie Fava. Olives. Grains Baked goods made with fat, such as croissants, muffins, or some breads. Dry pasta or rice meal packs. Meats and other proteins Fatty cuts of meat. Ribs. Fried meat. Berniece Salines. Bologna, salami, and other precooked or cured meats, such as sausages or meat loaves. Fat from the back of a pig (fatback). Bratwurst. Salted nuts and seeds. Canned  beans with added salt. Canned or smoked fish. Whole eggs or egg yolks. Chicken or [place] with skin. Dairy Whole or 2% milk, cream, and half-and-half. Whole or full-fat cream cheese. Whole-fat or sweetened yogurt. Full-fat cheese. Nondairy creamers. Whipped toppings. Processed cheese and cheese spreads. Fats and oils Butter. Stick margarine. Lard. Shortening. Ghee. Bacon fat. Tropical oils, such as coconut, palm kernel, or palm oil. Seasonings and condiments Onion salt, garlic salt, seasoned salt, table salt, and sea salt. Worcestershire sauce. Tartar sauce. Barbecue sauce. Teriyaki sauce. Soy sauce, including reduced-sodium. Steak sauce. Canned and packaged gravies. Fish sauce. Oyster sauce. Cocktail sauce. Store-bought horseradish. Ketchup. Mustard. Meat flavorings and tenderizers. Bouillon cubes. Hot sauces. Pre-made or packaged marinades. Pre-made or packaged taco seasonings. Relishes. Regular salad dressings. Other foods Salted popcorn and pretzels. The items listed above may not be a complete list of foods and beverages you should avoid. Contact a dietitian for more information. Where to find more information National Heart, Lung, and Blood Institute: https://wilson-eaton.com/ American Heart Association: www.heart.org Academy of Nutrition and Dietetics: www.eatright.[place]: www.kidney.org Summary The DASH eating plan is a healthy eating plan that has been shown to reduce high blood pressure (hypertension). It may also reduce your risk for type 2 diabetes, heart disease, and stroke. When on the DASH eating plan, aim to eat more fresh fruits and vegetables, whole grains, lean  proteins, low-fat dairy, and heart-healthy fats. With the DASH eating plan, you should limit salt (sodium) intake to 2,300 mg a day. If you have hypertension, you may need to reduce your sodium intake to 1,500 mg a day. Work with your health care provider or dietitian to adjust your eating plan to your  individual calorie needs. This information is not intended to replace advice given to you by your health care provider. Make sure you discuss any questions you have with your health care provider. Document Revised: 02/02/2019 Document Reviewed: 02/02/2019 Elsevier Patient Education  [place].

## 2022-03-29 NOTE — Assessment & Plan Note (Signed)
Well controlled with Crestor 40 mg   Will check lipid profile today  Nutrition: Stressed importance of moderation in sodium intake, saturated fat and cholesterol, caloric balance, sufficient intake of complex carbohydrates, fiber, calcium and iron.   Exercise: Stressed the importance of regular exercise.

## 2022-03-29 NOTE — Assessment & Plan Note (Signed)
Try to avoid pollens and triggers

## 2022-03-29 NOTE — Assessment & Plan Note (Addendum)
>>  ASSESSMENT AND PLAN FOR COMBINED HYPERLIPIDEMIA ASSOCIATED WITH TYPE 2 DIABETES MELLITUS ([facility]) WRITTEN ON 03/29/2022  9:17 AM BY Lurline Del, FNP  Well controlled. Trend of A1C is up and down in past 2 years, mostly hanging in 7's  Checking A1C this morning Checking urine microalbumin  Nutrition: Stressed importance of moderation in sodium intake, saturated fat and cholesterol, caloric balance, sufficient intake of complex carbohydrates, fiber, calcium and iron.   Exercise: Stressed the importance of regular exercise.       >>ASSESSMENT AND PLAN FOR MIXED HYPERLIPIDEMIA WRITTEN ON 03/29/2022  9:15 AM BY Lurline Del, FNP  Well controlled with Crestor 40 mg   Will check lipid profile today  Nutrition: Stressed importance of moderation in sodium intake, saturated fat and cholesterol, caloric balance, sufficient intake of complex carbohydrates, fiber, calcium and iron.   Exercise: Stressed the importance of regular exercise.

## 2022-03-29 NOTE — Assessment & Plan Note (Signed)
Take pain medicine and Voltaren gel as prescribed  Will do X-ray if pain does not resolved in one week, patient refused X-ray this time

## 2022-03-29 NOTE — Assessment & Plan Note (Signed)
Blood pressure well controlled with lisinopril 20 mg OD and metoprolol 25 mg BD  Congratulated for regular exercise and walking her dog everyday CMP, CBC will be be checked today  Nutrition: Stressed importance of moderation in sodium intake, saturated fat and cholesterol, caloric balance, sufficient intake of complex carbohydrates, fiber, calcium and iron.   Exercise: Stressed the importance of regular exercise.

## 2022-03-30 ENCOUNTER — Other Ambulatory Visit: Payer: Self-pay | Admitting: Nurse Practitioner

## 2022-03-31 ENCOUNTER — Ambulatory Visit (INDEPENDENT_AMBULATORY_CARE_PROVIDER_SITE_OTHER): Payer: Medicare HMO | Admitting: Nurse Practitioner

## 2022-03-31 ENCOUNTER — Telehealth: Payer: Self-pay

## 2022-03-31 VITALS — BP 128/70 | HR 63 | Temp 97.0°F | Resp 16 | Ht 62.0 in | Wt 132.6 lb

## 2022-03-31 DIAGNOSIS — R1011 Right upper quadrant pain: Secondary | ICD-10-CM | POA: Insufficient documentation

## 2022-03-31 DIAGNOSIS — K838 Other specified diseases of biliary tract: Secondary | ICD-10-CM | POA: Diagnosis not present

## 2022-03-31 DIAGNOSIS — Z9049 Acquired absence of other specified parts of digestive tract: Secondary | ICD-10-CM | POA: Diagnosis not present

## 2022-03-31 LAB — POCT URINALYSIS DIP (CLINITEK)
Blood, UA: NEGATIVE
Glucose, UA: NEGATIVE mg/dL
Ketones, POC UA: NEGATIVE mg/dL
Leukocytes, UA: NEGATIVE
Nitrite, UA: NEGATIVE
POC PROTEIN,UA: NEGATIVE
Spec Grav, UA: >=1.03 — AB (ref 1.010–1.025)
Urobilinogen, UA: 0.2 E.U./dL
pH, UA: 5 (ref 5.0–8.0)

## 2022-03-31 LAB — COMPREHENSIVE METABOLIC PANEL
ALT: 33 IU/L — ABNORMAL HIGH (ref 0–32)
AST: 40 IU/L (ref 0–40)
Albumin/Globulin Ratio: 2.1 (ref 1.2–2.2)
Albumin: 4.6 g/dL (ref 3.7–4.7)
Alkaline Phosphatase: 58 IU/L (ref 44–121)
BUN/Creatinine Ratio: 18 (ref 12–28)
BUN: 15 mg/dL (ref 8–27)
Bilirubin Total: 0.9 mg/dL (ref 0.0–1.2)
CO2: 24 mmol/L (ref 20–29)
Calcium: 9.8 mg/dL (ref 8.7–10.3)
Chloride: 104 mmol/L (ref 96–106)
Creatinine, Ser: 0.85 mg/dL (ref 0.57–1.00)
Globulin, Total: 2.2 g/dL (ref 1.5–4.5)
Glucose: 123 mg/dL — ABNORMAL HIGH (ref 70–99)
Potassium: 4.7 mmol/L (ref 3.5–5.2)
Sodium: 143 mmol/L (ref 134–144)
Total Protein: 6.8 g/dL (ref 6.0–8.5)
eGFR: 68 mL/min/{1.73_m2} (ref >59)

## 2022-03-31 LAB — CBC WITH DIFFERENTIAL/PLATELET
Basophils Absolute: 0.1 10*3/uL (ref 0.0–0.2)
Basos: 1 %
EOS (ABSOLUTE): 0.1 10*3/uL (ref 0.0–0.4)
Eos: 1 %
Hematocrit: 46.5 % (ref 34.0–46.6)
Hemoglobin: 14.6 g/dL (ref 11.1–15.9)
Immature Grans (Abs): 0 10*3/uL (ref 0.0–0.1)
Immature Granulocytes: 0 %
Lymphocytes Absolute: 3 10*3/uL (ref 0.7–3.1)
Lymphs: 40 %
MCH: 27.5 pg (ref 26.6–33.0)
MCHC: 31.4 g/dL — ABNORMAL LOW (ref 31.5–35.7)
MCV: 88 fL (ref 79–97)
Monocytes Absolute: 0.5 10*3/uL (ref 0.1–0.9)
Monocytes: 7 %
Neutrophils Absolute: 3.9 10*3/uL (ref 1.4–7.0)
Neutrophils: 51 %
Platelets: 212 10*3/uL (ref 150–450)
RBC: 5.31 x10E6/uL — ABNORMAL HIGH (ref 3.77–5.28)
RDW: 14.4 % (ref 11.7–15.4)
WBC: 7.6 10*3/uL (ref 3.4–10.8)

## 2022-03-31 LAB — HEMOGLOBIN A1C
Est. average glucose Bld gHb Est-mCnc: 166 mg/dL
Hgb A1c MFr Bld: 7.4 % — ABNORMAL HIGH (ref 4.8–5.6)

## 2022-03-31 LAB — LIPID PANEL
Chol/HDL Ratio: 2.7 ratio (ref 0.0–4.4)
Cholesterol, Total: 184 mg/dL (ref 100–199)
HDL: 68 mg/dL (ref >39)
LDL Chol Calc (NIH): 98 mg/dL (ref 0–99)
Triglycerides: 98 mg/dL (ref 0–149)
VLDL Cholesterol Cal: 18 mg/dL (ref 5–40)

## 2022-03-31 LAB — MICROALBUMIN / CREATININE URINE RATIO

## 2022-03-31 LAB — TSH: TSH: 2.08 u[IU]/mL (ref 0.450–4.500)

## 2022-03-31 LAB — CARDIOVASCULAR RISK ASSESSMENT

## 2022-03-31 NOTE — Patient Instructions (Signed)
Office will call regarding the result of stat ultrasound Follow up as needed

## 2022-03-31 NOTE — Progress Notes (Signed)
Care Management & Coordination Services Pharmacy Team  Reason for Encounter: Diabetes  Contacted patient on 03/31/2022 to discuss diabetes disease state.   Recent office visits:  03/31/22 Neil Crouch FNP. Seen for Abdominal pain. No med changes.   03/29/22 Neil Crouch FNP. Seen for routine visit. Started on Fluticasone, Meloxicam '15mg'$ . D/C Ezetimibe '10mg'$  and Mirtazapine '15mg'$ .  03/22/22 Neil Crouch FNP. Seen for Osteoporosis. Started on Ibandronate Sodium '150mg'$ . Vitamin D3 with calcium citrate over the counter every day.  03/10/22 Neil Crouch FNP.Seen UTI. Referral to Physical Therapy. Started on Diclofenac Sodium 4g and Macrobid '100mg'$ .   02/24/22 Reinaldo Meeker MD. Orders Only. D/C Glimepiride '1mg'$ .   Recent consult visits:  None  Hospital visits:  None  Medications: Outpatient Encounter Medications as of 03/31/2022  Medication Sig   ACCU-CHEK GUIDE test strip 1 each by Other route daily. Check blood sugar fasting in the morning daily.   BD PEN NEEDLE NANO U/F 32G X 4 MM MISC See admin instructions.   Blood Glucose Monitoring Suppl (ACCU-CHEK GUIDE) w/Device KIT 1 each by Other route daily.   diclofenac Sodium (VOLTAREN) 1 % GEL Apply 4 g topically 4 (four) times daily.   estradiol (ESTRACE) 0.1 MG/GM vaginal cream INSERT 1 APPLICATORFUL VAGINALLY 3 TIMES A WEEK   fluticasone (FLONASE) 50 MCG/ACT nasal spray Place 2 sprays into both nostrils daily.   gabapentin (NEURONTIN) 300 MG capsule Take 1 capsule (300 mg total) by mouth 3 (three) times daily.   ibandronate (BONIVA) 150 MG tablet Take 1 tablet (150 mg total) by mouth every 30 (thirty) days. Take in the morning with a full glass of water, on an empty stomach, and do not take anything else by mouth or lie down for the next 30 min.   insulin detemir (LEVEMIR FLEXTOUCH) 100 UNIT/ML FlexPen Inject 12 Units into the skin daily. INJECT 14 UNITS SUBCUTANEOUSLY EVERY NIGHT AT BEDTIME   Lancets (ONETOUCH ULTRASOFT) lancets 1 each by  Other route daily. Use as instructed   liraglutide (VICTOZA) 18 MG/3ML SOPN INJECT 0.2 MILLILITERS (1.2 MG TOTAL) UNDER THE SKIN DAILY.   lisinopril (ZESTRIL) 20 MG tablet Take 20 mg by mouth daily.   meloxicam (MOBIC) 15 MG tablet Take 1 tablet (15 mg total) by mouth daily.   metoprolol tartrate (LOPRESSOR) 25 MG tablet Take 1 tablet (25 mg total) by mouth 2 (two) times daily.   mupirocin ointment (BACTROBAN) 2 % Apply 1 Application topically 2 (two) times daily.   nitrofurantoin (MACRODANTIN) 50 MG capsule TAKE 1 CAPSULE(50 MG) BY MOUTH AT BEDTIME   pantoprazole (PROTONIX) 40 MG tablet Take 1 tablet (40 mg total) by mouth daily.   rosuvastatin (CRESTOR) 40 MG tablet Take 1 tablet (40 mg total) by mouth at bedtime.   Vitamin D, Ergocalciferol, (DRISDOL) 1.25 MG (50000 UNIT) CAPS capsule Take 1 capsule (50,000 Units total) by mouth every 7 (seven) days.   No facility-administered encounter medications on file as of 03/31/2022.    Recent Relevant Labs: Lab Results  Component Value Date/Time   HGBA1C 7.4 (H) 10/29/2021 09:01 AM   HGBA1C 7.6 (H) 06/22/2021 09:55 AM   MICROALBUR 30 07/21/2020 11:11 AM   MICROALBUR 30 06/25/2019 09:02 AM    Kidney Function Lab Results  Component Value Date/Time   CREATININE 0.90 10/29/2021 09:01 AM   CREATININE 0.96 06/22/2021 09:55 AM   GFRNONAA 50 (L) 12/24/2019 11:04 AM   GFRAA 57 (L) 12/24/2019 11:04 AM    Current antihyperglycemic regimen:  Insulin Detemir 100units- Inject 12  Units into the skin in am Liraglutide '18mg'$ /51m- Inject 0.256mat bedtime  Patient verbally confirms she is taking the above medications as directed. Yes  What diet changes have been made to improve diabetes control?  What recent interventions/DTPs have been made to improve glycemic control:  No changes   Have there been any recent hospitalizations or ED visits since last visit with PharmD? No  Patient denies hypoglycemic symptoms, including None  Patient denies  hyperglycemic symptoms, including none  How often are you checking your blood sugar? in the morning before eating or drinking  What are your blood sugars ranging?  Fasting: 03/30/22 175 03/31/22 125, 04/01/22 135  During the week, how often does your blood glucose drop below 70? Never  Are you checking your feet daily/regularly? Yes  Adherence Review: Is the patient currently on a STATIN medication? Yes Is the patient currently on ACE/ARB medication? Yes Does the patient have >5 day gap between last estimated fill dates? No  Star Rating Drugs:  Medication:  Last Fill: Day Supply Liraglutide   03/11/22-02/11/22 30ds  Care Gaps: Annual wellness visit in last year? Yes, 11/12/20 Last eye exam / retinopathy screening: 03/19/20 Last diabetic foot exam:03/29/22   DaOvidio Hangererringer

## 2022-03-31 NOTE — Assessment & Plan Note (Signed)
Stat USG for RUQ pain Will call the patient with result Possible refer to GI CBC from Normal, since patient complaining mild pain for a week, will repeat CBC as needed

## 2022-03-31 NOTE — Progress Notes (Signed)
Acute Office Visit  Subjective:    Patient ID: Rebecca Hodge, female    DOB: [birth year], 82 y.o.   MRN: [id number]  Chief Complaints:  History of Present ilIness: Patient is in today for ruq abdominal pain. This has been going on for 1 week.   Past Medical History:  Diagnosis Date   Age-related osteoporosis without current pathological fracture 06/18/2019   Allergic rhinitis    BMI 27.0-27.9,adult 08/17/2019   Diabetes ([place])    Dysphasia following cerebral infarction 06/18/2019   Hyperlipidemia    Hypertension    Mixed hyperlipidemia 06/18/2019   Mixed incontinence 06/18/2019   Sequelae of poliomyelitis 06/18/2019   Type 2 diabetes mellitus with other specified complication ([place]) 07/17/6566    Past Surgical History:  Procedure Laterality Date   ABDOMINAL HYSTERECTOMY     CATARACT EXTRACTION Right    CHOLECYSTECTOMY     ROTATOR CUFF REPAIR Bilateral     No family history on file.  Social History   Socioeconomic History   Marital status: Widowed    Spouse name: Not on file   Number of children: Not on file   Years of education: Not on file   Highest education level: Not on file  Occupational History   Occupation: Disabled  Tobacco Use   Smoking status: Former    Packs/day: 1.00    Years: 5.00    Total pack years: 5.00    Types: Cigarettes    Quit date: 03/16/1987    Years since quitting: 35.0   Smokeless tobacco: Never  Substance and Sexual Activity   Alcohol use: No   Drug use: No   Sexual activity: Not Currently  Other Topics Concern   Not on file  Social History Narrative   Not on file   Social Determinants of Health   Financial Resource Strain: Low Risk  (02/24/2022)   Overall Financial Resource Strain (CARDIA)    Difficulty of Paying Living Expenses: Not hard at all  Food Insecurity: Not on file  Transportation Needs: No Transportation Needs (02/24/2022)   PRAPARE - Hydrologist (Medical): No    Lack of Transportation  (Non-Medical): No  Physical Activity: Not on file  Stress: Not on file  Social Connections: Not on file  Intimate Partner Violence: Not on file    Outpatient Medications Prior to Visit  Medication Sig Dispense Refill   ACCU-CHEK GUIDE test strip 1 each by Other route daily. Check blood sugar fasting in the morning daily. 100 each 2   BD PEN NEEDLE NANO U/F 32G X 4 MM MISC See admin instructions.     Blood Glucose Monitoring Suppl (ACCU-CHEK GUIDE) w/Device KIT 1 each by Other route daily. 1 kit 0   diclofenac Sodium (VOLTAREN) 1 % GEL Apply 4 g topically 4 (four) times daily. 4 g 1   estradiol (ESTRACE) 0.1 MG/GM vaginal cream INSERT 1 APPLICATORFUL VAGINALLY 3 TIMES A WEEK 42.5 g 3   fluticasone (FLONASE) 50 MCG/ACT nasal spray Place 2 sprays into both nostrils daily. 16 g 6   gabapentin (NEURONTIN) 300 MG capsule Take 1 capsule (300 mg total) by mouth 3 (three) times daily. 270 capsule 2   ibandronate (BONIVA) 150 MG tablet Take 1 tablet (150 mg total) by mouth every 30 (thirty) days. Take in the morning with a full glass of water, on an empty stomach, and do not take anything else by mouth or lie down for the next 30 min. 12 tablet 0  insulin detemir (LEVEMIR FLEXTOUCH) 100 UNIT/ML FlexPen Inject 12 Units into the skin daily. INJECT 14 UNITS SUBCUTANEOUSLY EVERY NIGHT AT BEDTIME 15 mL 2   Lancets (ONETOUCH ULTRASOFT) lancets 1 each by Other route daily. Use as instructed 100 each 2   liraglutide (VICTOZA) 18 MG/3ML SOPN INJECT 0.2 MILLILITERS (1.2 MG TOTAL) UNDER THE SKIN DAILY. 6 mL 6   lisinopril (ZESTRIL) 20 MG tablet Take 20 mg by mouth daily.     meloxicam (MOBIC) 15 MG tablet Take 1 tablet (15 mg total) by mouth daily. 30 tablet 0   metoprolol tartrate (LOPRESSOR) 25 MG tablet Take 1 tablet (25 mg total) by mouth 2 (two) times daily. 180 tablet 2   mupirocin ointment (BACTROBAN) 2 % Apply 1 Application topically 2 (two) times daily. 22 g 2   nitrofurantoin (MACRODANTIN) 50 MG  capsule TAKE 1 CAPSULE(50 MG) BY MOUTH AT BEDTIME 90 capsule 2   pantoprazole (PROTONIX) 40 MG tablet Take 1 tablet (40 mg total) by mouth daily. 90 tablet 2   rosuvastatin (CRESTOR) 40 MG tablet Take 1 tablet (40 mg total) by mouth at bedtime. 90 tablet 2   Vitamin D, Ergocalciferol, (DRISDOL) 1.25 MG (50000 UNIT) CAPS capsule Take 1 capsule (50,000 Units total) by mouth every 7 (seven) days. 12 capsule 6   No facility-administered medications prior to visit.    Allergies  Allergen Reactions   Codeine    Morphine And Related    Sulfa Antibiotics Itching    Review of Systems  Constitutional:  Negative for chills and fatigue.  HENT:  Negative for congestion, ear pain, sinus pain and sore throat.   Respiratory:  Negative for cough and shortness of breath.   Cardiovascular:  Negative for chest pain and leg swelling.  Gastrointestinal:  Positive for abdominal pain. Negative for constipation, diarrhea, nausea and vomiting.  Genitourinary:  Negative for dysuria and frequency.  Musculoskeletal:  Negative for arthralgias, back pain and myalgias.  Neurological:  Negative for dizziness and headaches.       Objective:    Physical Exam Vitals reviewed. Exam conducted with a chaperone present.  Constitutional:      Appearance: Normal appearance. She is normal weight.  Neck:     Vascular: No carotid bruit.  Cardiovascular:     Rate and Rhythm: Normal rate and regular rhythm.     Heart sounds: Normal heart sounds.  Pulmonary:     Effort: Pulmonary effort is normal.     Breath sounds: Normal breath sounds.  Abdominal:     General: Abdomen is flat. Bowel sounds are normal.     Palpations: Abdomen is soft.     Tenderness: There is abdominal tenderness in the right upper quadrant. There is guarding and rebound. There is no right CVA tenderness or left CVA tenderness. Positive signs include Murphy's sign. Negative signs include Rovsing's sign, McBurney's sign, psoas sign and obturator sign.   Musculoskeletal:     Cervical back: Normal range of motion.  Neurological:     Mental Status: She is alert and oriented to person, place, and time.  Psychiatric:        Mood and Affect: Mood normal.        Behavior: Behavior normal.     BP 128/70   Pulse 63   Temp (!) 97 F (36.1 C)   Resp 16   Ht '5\' 2"'$  (1.575 m)   Wt 132 lb 9.6 oz (60.1 kg)   LMP  (LMP Unknown)   SpO2 95%  BMI 24.25 kg/m  Wt Readings from Last 3 Encounters:  03/31/22 132 lb 9.6 oz (60.1 kg)  03/29/22 132 lb (59.9 kg)  03/22/22 134 lb 9.6 oz (61.1 kg)    Health Maintenance Due  Topic Date Due   DTaP/Tdap/Td (1 - Tdap) Never done   Zoster Vaccines- Shingrix (1 of 2) Never done   MAMMOGRAM  11/30/2019   OPHTHALMOLOGY EXAM  03/19/2021   Medicare Annual Wellness (AWV)  11/12/2021   COVID-19 Vaccine (4 - 2023-24 season) 11/13/2021    There are no preventive care reminders to display for this patient.   No results found for: "TSH" Lab Results  Component Value Date   WBC 8.4 10/29/2021   HGB 13.8 10/29/2021   HCT 45.4 10/29/2021   MCV 88 10/29/2021   PLT 266 10/29/2021   Lab Results  Component Value Date   NA 144 10/29/2021   K 4.1 10/29/2021   CO2 25 10/29/2021   GLUCOSE 75 10/29/2021   BUN 10 10/29/2021   CREATININE 0.90 10/29/2021   BILITOT 0.8 10/29/2021   ALKPHOS 63 10/29/2021   AST 20 10/29/2021   ALT 17 10/29/2021   PROT 6.6 10/29/2021   ALBUMIN 4.4 10/29/2021   CALCIUM 9.0 10/29/2021   EGFR 64 10/29/2021   Lab Results  Component Value Date   CHOL 155 10/29/2021   Lab Results  Component Value Date   HDL 58 10/29/2021   Lab Results  Component Value Date   LDLCALC 77 10/29/2021   Lab Results  Component Value Date   TRIG 112 10/29/2021   Lab Results  Component Value Date   CHOLHDL 2.7 10/29/2021   Lab Results  Component Value Date   HGBA1C 7.4 (H) 10/29/2021      Assessment & Plan:   Problem List Items Addressed This Visit       Other   RUQ pain -  Primary    Stat USG for RUQ pain Will call the patient with result Possible refer to GI CBC from Normal, since patient complaining mild pain for a week, will repeat CBC as needed      Relevant Orders   POCT URINALYSIS DIP (CLINITEK) (Completed)   US Abdomen Complete     Follow-up: office will call with USG report from [place], Neil Crouch have reviewed all documentation for this visit. The documentation on 03/31/22   for the exam, diagnosis, procedures, and orders are all accurate and complete.     An After Visit Summary was printed and given to the patient.  Neil Crouch, DNP, [place] [phone]

## 2022-04-02 DIAGNOSIS — E1169 Type 2 diabetes mellitus with other specified complication: Secondary | ICD-10-CM | POA: Diagnosis not present

## 2022-04-12 ENCOUNTER — Other Ambulatory Visit: Payer: Self-pay

## 2022-04-12 DIAGNOSIS — Z794 Long term (current) use of insulin: Secondary | ICD-10-CM

## 2022-04-12 DIAGNOSIS — E1169 Type 2 diabetes mellitus with other specified complication: Secondary | ICD-10-CM

## 2022-04-15 DIAGNOSIS — R32 Unspecified urinary incontinence: Secondary | ICD-10-CM | POA: Diagnosis not present

## 2022-04-20 ENCOUNTER — Telehealth: Payer: Self-pay

## 2022-04-20 NOTE — Progress Notes (Signed)
Care Management & Coordination Services Pharmacy Team  Reason for Encounter: Appointment Reminder  Contacted patient to confirm telephone appointment with Arizona Constable, PharmD on 04-22-2022 at 1:00. Patient has to take daughter to doctor on 02-08 and rescheduled to 02-13    Chart review: Recent office visits:  None  Recent consult visits:  None  Hospital visits:  None in previous 6 months   Star Rating Drugs:  Liraglutide- Last filled 04-11-2022 30 DS. Previous 03-11-2022 30 DS   Care Gaps: Annual wellness visit in last year? No Tdap overdue Shingrix overdue Mammogram overdue Eye exam overdue Covid booster overdue AWV overdue  If Diabetic: Last eye exam / retinopathy screening: 03/19/20  Last diabetic foot exam: 03-29-2022   [place] Pharmacist Assistant [phone]

## 2022-04-26 ENCOUNTER — Other Ambulatory Visit: Payer: Self-pay

## 2022-04-26 DIAGNOSIS — R1084 Generalized abdominal pain: Secondary | ICD-10-CM

## 2022-04-26 MED ORDER — PANTOPRAZOLE SODIUM 40 MG PO TBEC: 40.0000 mg | DELAYED_RELEASE_TABLET | Freq: Every day | ORAL | 2 refills | Status: DC

## 2022-04-26 MED ORDER — ROSUVASTATIN CALCIUM 40 MG PO TABS: 40.0000 mg | ORAL_TABLET | Freq: Every day | ORAL | 2 refills | Status: DC

## 2022-04-27 ENCOUNTER — Ambulatory Visit: Payer: Medicare HMO

## 2022-04-27 NOTE — Patient Outreach (Signed)
  Care Management   Follow Up Note   04/27/2022 Name: Rebecca Hodge MRN: [id number] DOB: 1940/05/01   Referred by: Neil Crouch, FNP Reason for referral : Care Coordination   An unsuccessful telephone outreach was attempted today. The patient was referred to the case management team for assistance with care management and care coordination.   Follow Up Plan: The patient has been provided with contact information for the care management team and has been advised to call with any health related questions or concerns.   Arizona Constable, Pharm.D. - [phone]

## 2022-04-28 ENCOUNTER — Telehealth: Payer: Self-pay

## 2022-04-28 NOTE — Progress Notes (Signed)
Called pt today and discussed:  Blood sugar readings: Fasting 04/19/22 103 04/20/22 182  04/21/22 126 04/22/22 112 04/23/22 172  04/24/22 132 04/25/22 69  Pt only had one BP reading 04/28/22 99/53 Pulse 66   Elray Mcgregor, CMA Catering manager  [phone]

## 2022-05-02 DIAGNOSIS — E1169 Type 2 diabetes mellitus with other specified complication: Secondary | ICD-10-CM | POA: Diagnosis not present

## 2022-05-06 ENCOUNTER — Other Ambulatory Visit: Payer: Self-pay | Admitting: Nurse Practitioner

## 2022-05-06 ENCOUNTER — Other Ambulatory Visit: Payer: Self-pay

## 2022-05-06 DIAGNOSIS — Z794 Long term (current) use of insulin: Secondary | ICD-10-CM

## 2022-05-06 MED ORDER — LEVEMIR FLEXTOUCH 100 UNIT/ML ~~LOC~~ SOPN: 14.0000 [IU] | PEN_INJECTOR | Freq: Every day | SUBCUTANEOUS | 2 refills | Status: DC

## 2022-05-14 DIAGNOSIS — E1165 Type 2 diabetes mellitus with hyperglycemia: Secondary | ICD-10-CM | POA: Insufficient documentation

## 2022-05-14 NOTE — Assessment & Plan Note (Signed)
Using insulin Levemir 14 unit daily

## 2022-05-18 ENCOUNTER — Other Ambulatory Visit: Payer: Self-pay | Admitting: Family Medicine

## 2022-05-18 MED ORDER — INSULIN GLARGINE 100 UNIT/ML SOLOSTAR PEN: 14.0000 [IU] | PEN_INJECTOR | Freq: Every day | SUBCUTANEOUS | 1 refills | Status: DC

## 2022-05-20 ENCOUNTER — Telehealth: Payer: Self-pay

## 2022-05-20 NOTE — Progress Notes (Signed)
Care Management & Coordination Services Pharmacy Team  Reason for Encounter: Diabetes  Contacted patient to discuss diabetes disease state. Spoke with patient on 05/20/2022   Current antihyperglycemic regimen:  Lantus 100units Inject 14 units daily Victoza '18mg'$ /64m Inject 0.2ML daily  Patient verbally confirms she is taking the above medications as directed. Yes  What diet changes have been made to improve diabetes control? Pt stated her sugars run higher in the morning when she eats a big meal at night and depends on what it is. She stated she will eat sugary foods late at night and her sugars will be high in the morning.   What recent interventions/DTPs have been made to improve glycemic control:  Pt is finishing up her Levemir before starting on the Lantus. Pt has not picked up the Lantus yet but will once Levemir runs out.   Have there been any recent hospitalizations or ED visits since last visit with PharmD? No  Patient denies hypoglycemic symptoms, including None  Patient denies hyperglycemic symptoms, including none  How often are you checking your blood sugar? in the morning before eating or drinking  What are your blood sugars ranging?  Fasting: 05/10/22 103, 05/11/22 108, 05/12/22 103, 05/13/22 105, 05/14/22 175, 05/15/22 182, 05/16/22 278   During the week, how often does your blood glucose drop below 70? Never  Are you checking your feet daily/regularly? Yes  Adherence Review: Is the patient currently on a STATIN medication? Yes Is the patient currently on ACE/ARB medication? Yes Does the patient have >5 day gap between last estimated fill dates? No   Chart Updates:  Recent office visits:  05/18/22 CRochel BromeMD. Orders Only. Changed from Levemir to Lantus 100units.   Recent consult visits:  None  Hospital visits:  None  Medications: Outpatient Encounter Medications as of 05/20/2022  Medication Sig   ACCU-CHEK GUIDE test strip 1 each by Other route daily. Check  blood sugar fasting in the morning daily.   BD PEN NEEDLE NANO U/F 32G X 4 MM MISC See admin instructions.   Blood Glucose Monitoring Suppl (ACCU-CHEK GUIDE) w/Device KIT 1 each by Other route daily.   diclofenac Sodium (VOLTAREN) 1 % GEL Apply 4 g topically 4 (four) times daily.   estradiol (ESTRACE) 0.1 MG/GM vaginal cream INSERT 1 APPLICATORFUL VAGINALLY 3 TIMES A WEEK   fluticasone (FLONASE) 50 MCG/ACT nasal spray Place 2 sprays into both nostrils daily.   gabapentin (NEURONTIN) 300 MG capsule Take 1 capsule (300 mg total) by mouth 3 (three) times daily.   ibandronate (BONIVA) 150 MG tablet Take 1 tablet (150 mg total) by mouth every 30 (thirty) days. Take in the morning with a full glass of water, on an empty stomach, and do not take anything else by mouth or lie down for the next 30 min.   insulin glargine (LANTUS) 100 UNIT/ML Solostar Pen Inject 14 Units into the skin daily.   Lancets (ONETOUCH ULTRASOFT) lancets 1 each by Other route daily. Use as instructed   liraglutide (VICTOZA) 18 MG/3ML SOPN INJECT 0.2 MILLILITERS (1.2 MG TOTAL) UNDER THE SKIN DAILY.   lisinopril (ZESTRIL) 20 MG tablet Take 20 mg by mouth daily.   meloxicam (MOBIC) 15 MG tablet Take 1 tablet (15 mg total) by mouth daily.   metoprolol tartrate (LOPRESSOR) 25 MG tablet Take 1 tablet (25 mg total) by mouth 2 (two) times daily.   mupirocin ointment (BACTROBAN) 2 % Apply 1 Application topically 2 (two) times daily.   nitrofurantoin (MACRODANTIN) 50  MG capsule TAKE 1 CAPSULE(50 MG) BY MOUTH AT BEDTIME   pantoprazole (PROTONIX) 40 MG tablet Take 1 tablet (40 mg total) by mouth daily.   rosuvastatin (CRESTOR) 40 MG tablet Take 1 tablet (40 mg total) by mouth at bedtime.   Vitamin D, Ergocalciferol, (DRISDOL) 1.25 MG (50000 UNIT) CAPS capsule Take 1 capsule (50,000 Units total) by mouth every 7 (seven) days.   No facility-administered encounter medications on file as of 05/20/2022.    Recent Relevant Labs: Lab Results   Component Value Date/Time   HGBA1C 7.4 (H) 03/29/2022 08:28 AM   HGBA1C 7.4 (H) 10/29/2021 09:01 AM   MICROALBUR 30 07/21/2020 11:11 AM   MICROALBUR 30 06/25/2019 09:02 AM    Kidney Function Lab Results  Component Value Date/Time   CREATININE 0.85 03/29/2022 08:28 AM   CREATININE 0.90 10/29/2021 09:01 AM   GFRNONAA 50 (L) 12/24/2019 11:04 AM   GFRAA 57 (L) 12/24/2019 11:04 AM    Star Rating Drugs:  Medication:  Last Fill: Day Supply Victoza   05/09/22-04/11/22 30ds   Care Gaps: Annual wellness visit in last year? No Last eye exam / retinopathy screening:03/19/20 Last diabetic foot exam:03/29/22   Elray Mcgregor, [place] Pharmacist Assistant  [phone]

## 2022-05-26 ENCOUNTER — Telehealth: Payer: Self-pay

## 2022-05-26 NOTE — Telephone Encounter (Signed)
Patient's daughter called yesterday stating that the patient had went to the ER the night before and she stated that she was having a reaction to the Lantus she just started stating that the ER doctor told her to discontinue, when asking the daughter she stated that patient had went to [facility] so I told the daughter that we would retrieve those records and give her a call back with recommendations. Called the patient back and the patient answered and I informed her that [facility] had no record of her going to [facility] ER and the patient just said ok and hung up the phone and now will not return our phone calls. Documenting in patient's chart just for future reference due to the claim that they went to the ER and we have no record and neither does the hospital of that patient going there.

## 2022-06-01 DIAGNOSIS — E1169 Type 2 diabetes mellitus with other specified complication: Secondary | ICD-10-CM | POA: Diagnosis not present

## 2022-06-08 ENCOUNTER — Other Ambulatory Visit: Payer: Self-pay

## 2022-06-08 DIAGNOSIS — Z794 Long term (current) use of insulin: Secondary | ICD-10-CM

## 2022-06-08 MED ORDER — VICTOZA 18 MG/3ML ~~LOC~~ SOPN: PEN_INJECTOR | SUBCUTANEOUS | 6 refills | Status: DC

## 2022-06-14 DIAGNOSIS — R32 Unspecified urinary incontinence: Secondary | ICD-10-CM | POA: Diagnosis not present

## 2022-06-16 ENCOUNTER — Other Ambulatory Visit: Payer: Self-pay

## 2022-06-16 ENCOUNTER — Telehealth: Payer: Self-pay

## 2022-06-16 MED ORDER — BASAGLAR KWIKPEN 100 UNIT/ML ~~LOC~~ SOPN: 14.0000 [IU] | PEN_INJECTOR | Freq: Every day | SUBCUTANEOUS | Status: DC

## 2022-06-16 NOTE — Progress Notes (Signed)
Care Management & Coordination Services Pharmacy Team  Reason for Encounter: Diabetes  Contacted patient to discuss diabetes disease state. Spoke with patient on 06/17/2022   Current antihyperglycemic regimen:  Basaglar Kwikpen Inject 14 units daily Victoza /39ml Inject 0.2ML daily  Patient verbally confirms she is taking the above medications as directed. Yes  What diet changes have been made to improve diabetes control? No diet changes   What recent interventions/DTPs have been made to improve glycemic control:  No recent changes  Have there been any recent hospitalizations or ED visits since last visit with PharmD? No  Patient denies hypoglycemic symptoms, including None  Patient denies hyperglycemic symptoms, including none  How often are you checking your blood sugar? in the morning before eating or drinking  What are your blood sugars ranging?  Fasting: 06/07/22 103, 06/08/22 182 (ate a lot of sweets following night), 06/09/22 103, 06/10/22 104, 06/11/22 103, 06/14/22 104,  06/15/22 102, 06/16/22 103, 06/17/22 104   During the week, how often does your blood glucose drop below 70? Never  Are you checking your feet daily/regularly? Yes  Adherence Review: Is the patient currently on a STATIN medication? Yes Is the patient currently on ACE/ARB medication? Yes Does the patient have >5 day gap between last estimated fill dates? No   Chart Updates:  Recent office visits:  None  Recent consult visits:  None  Hospital visits:  None  Medications: Outpatient Encounter Medications as of 06/16/2022  Medication Sig   ACCU-CHEK GUIDE test strip 1 each by Other route daily. Check blood sugar fasting in the morning daily.   BD PEN NEEDLE NANO U/F 32G X 4 MM MISC See admin instructions.   Blood Glucose Monitoring Suppl (ACCU-CHEK GUIDE) w/Device KIT 1 each by Other route daily.   diclofenac Sodium (VOLTAREN) 1 % GEL Apply 4 g topically 4 (four) times daily.   estradiol (ESTRACE) 0.1  MG/GM vaginal cream INSERT 1 APPLICATORFUL VAGINALLY 3 TIMES A WEEK   fluticasone (FLONASE) 50 MCG/ACT nasal spray Place 2 sprays into both nostrils daily.   gabapentin (NEURONTIN) 300 MG capsule Take 1 capsule (300 mg total) by mouth 3 (three) times daily.   ibandronate (BONIVA) 150 MG tablet Take 1 tablet (150 mg total) by mouth every 30 (thirty) days. Take in the morning with a full glass of water, on an empty stomach, and do not take anything else by mouth or lie down for the next 30 min.   Insulin Glargine (BASAGLAR KWIKPEN) 100 UNIT/ML Inject 14 Units into the skin daily.   Lancets (ONETOUCH ULTRASOFT) lancets 1 each by Other route daily. Use as instructed   liraglutide (VICTOZA) 18 MG/3ML SOPN INJECT 0.2 MILLILITERS (1.2 MG TOTAL) UNDER THE SKIN DAILY.   lisinopril (ZESTRIL) 20 MG tablet Take 20 mg by mouth daily.   meloxicam (MOBIC) 15 MG tablet Take 1 tablet (15 mg total) by mouth daily.   metoprolol tartrate (LOPRESSOR) 25 MG tablet Take 1 tablet (25 mg total) by mouth 2 (two) times daily.   mupirocin ointment (BACTROBAN) 2 % Apply 1 Application topically 2 (two) times daily.   nitrofurantoin (MACRODANTIN) 50 MG capsule TAKE 1 CAPSULE(50 MG) BY MOUTH AT BEDTIME   pantoprazole (PROTONIX) 40 MG tablet Take 1 tablet (40 mg total) by mouth daily.   rosuvastatin (CRESTOR) 40 MG tablet Take 1 tablet (40 mg total) by mouth at bedtime.   Vitamin D, Ergocalciferol, (DRISDOL) 1.25 MG (50000 UNIT) CAPS capsule Take 1 capsule (50,000 Units total) by mouth  every 7 (seven) days.   No facility-administered encounter medications on file as of 06/16/2022.    Recent Relevant Labs: Lab Results  Component Value Date/Time   HGBA1C 7.4 (H) 03/29/2022 08:28 AM   HGBA1C 7.4 (H) 10/29/2021 09:01 AM   MICROALBUR 30 07/21/2020 11:11 AM   MICROALBUR 30 06/25/2019 09:02 AM    Kidney Function Lab Results  Component Value Date/Time   CREATININE 0.85 03/29/2022 08:28 AM   CREATININE 0.90 10/29/2021 09:01 AM    GFRNONAA 50 (L) 12/24/2019 11:04 AM   GFRAA 57 (L) 12/24/2019 11:04 AM    Star Rating Drugs:  Medication:  Last Fill: Day Supply Victoza                        06/08/22-05/09/22 90/30ds   Care Gaps: Annual wellness visit in last year? No Last eye exam / retinopathy screening: 03/19/20 Last diabetic foot exam:03/29/22    Roxana Hires, [facility] Clinical Pharmacist Assistant  [phone]

## 2022-06-18 ENCOUNTER — Other Ambulatory Visit: Payer: Self-pay

## 2022-06-18 MED ORDER — BASAGLAR KWIKPEN 100 UNIT/ML ~~LOC~~ SOPN: 14.0000 [IU] | PEN_INJECTOR | Freq: Every day | SUBCUTANEOUS | Status: DC

## 2022-06-30 ENCOUNTER — Ambulatory Visit: Payer: Medicare HMO | Admitting: Physician Assistant

## 2022-06-30 ENCOUNTER — Ambulatory Visit: Payer: Medicare HMO | Admitting: Nurse Practitioner

## 2022-07-08 ENCOUNTER — Telehealth: Payer: Self-pay

## 2022-07-08 ENCOUNTER — Other Ambulatory Visit: Payer: Self-pay | Admitting: Physician Assistant

## 2022-07-08 ENCOUNTER — Ambulatory Visit (INDEPENDENT_AMBULATORY_CARE_PROVIDER_SITE_OTHER): Payer: Medicare HMO | Admitting: Physician Assistant

## 2022-07-08 ENCOUNTER — Encounter: Payer: Self-pay | Admitting: Physician Assistant

## 2022-07-08 VITALS — BP 122/80 | HR 57 | Temp 97.0°F | Ht 62.0 in | Wt 130.2 lb

## 2022-07-08 DIAGNOSIS — N6321 Unspecified lump in the left breast, upper outer quadrant: Secondary | ICD-10-CM | POA: Diagnosis not present

## 2022-07-08 DIAGNOSIS — Z794 Long term (current) use of insulin: Secondary | ICD-10-CM

## 2022-07-08 MED ORDER — GABAPENTIN 300 MG PO CAPS: 300.0000 mg | ORAL_CAPSULE | Freq: Three times a day (TID) | ORAL | 2 refills | Status: DC

## 2022-07-08 MED ORDER — ACCU-CHEK GUIDE W/DEVICE KIT
1.0000 | PACK | Freq: Every day | 0 refills | Status: DC
Start: 2022-07-08 — End: 2022-11-17

## 2022-07-08 NOTE — Progress Notes (Addendum)
Subjective:  Patient ID: Rebecca Hodge, female    DOB: December 15, 1940  Age: 82 y.o. MRN: [id number]  Chief Complaint  Patient presents with   Arm Problem    Left    HPI   Pt in today with complaints of a knot under her left arm - she states it has been bothering her for over a month - tender to touch  She states she has had this issue for several months and in December was given antibiotics by another provider which she was told was an inflamed lymph node Pt denies chest pain The knot actually is located in the upper outer quadrant of left breast     07/08/2022    8:51 AM 11/03/2021    2:24 PM 06/22/2021    4:22 PM 06/22/2021    9:23 AM 12/23/2020    3:47 PM  Depression screen PHQ 2/9  Decreased Interest 0 0 0 0 0  Down, Depressed, Hopeless 0 0 0 0 0  PHQ - 2 Score 0 0 0 0 0  Altered sleeping 0 0     Tired, decreased energy 0 0     Change in appetite 0 0     Feeling bad or failure about yourself  0 0     Trouble concentrating 0 0     Moving slowly or fidgety/restless 0 0     Suicidal thoughts 0 0     PHQ-9 Score 0 0     Difficult doing work/chores Not difficult at all             10/01/2019    3:45 PM 11/12/2020    6:33 PM 12/23/2020    3:47 PM 06/22/2021    9:23 AM 07/08/2022    8:51 AM  Fall Risk  Falls in the past year? 0 0 0 0 0  Was there an injury with Fall? 0 0 0 0 0  Fall Risk Category Calculator 0 0 0 0 0  Fall Risk Category (Retired) Low Low Low Low   (RETIRED) Patient Fall Risk Level Low fall risk Low fall risk Low fall risk    Patient at Risk for Falls Due to  No Fall Risks No Fall Risks  No Fall Risks  Fall risk Follow up Falls evaluation completed Falls evaluation completed Falls evaluation completed  Falls evaluation completed      Review of Systems CONSTITUTIONAL: Negative for chills, fatigue, fever,  CARDIOVASCULAR: Negative for chest pain, dizziness, RESPIRATORY: Negative for recent cough and dyspnea.  Breast --see HPI MSK: Negative for  arthralgias and myalgias.  INTEGUMENTARY: Negative for rash.       Current Outpatient Medications on File Prior to Visit  Medication Sig Dispense Refill   ACCU-CHEK GUIDE test strip 1 each by Other route daily. Check blood sugar fasting in the morning daily. 100 each 2   BD PEN NEEDLE NANO U/F 32G X 4 MM MISC See admin instructions.     diclofenac Sodium (VOLTAREN) 1 % GEL Apply 4 g topically 4 (four) times daily. 4 g 1   estradiol (ESTRACE) 0.1 MG/GM vaginal cream INSERT 1 APPLICATORFUL VAGINALLY 3 TIMES A WEEK 42.5 g 3   ezetimibe (ZETIA) 10 MG tablet Take 10 mg by mouth daily.     fluticasone (FLONASE) 50 MCG/ACT nasal spray Place 2 sprays into both nostrils daily. 16 g 6   ibandronate (BONIVA) 150 MG tablet Take 1 tablet (150 mg total) by mouth every 30 (thirty) days. Take in the  morning with a full glass of water, on an empty stomach, and do not take anything else by mouth or lie down for the next 30 min. 12 tablet 0   Insulin Glargine (BASAGLAR KWIKPEN) 100 UNIT/ML Inject 14 Units into the skin daily. 15 mL ML   Lancets (ONETOUCH ULTRASOFT) lancets 1 each by Other route daily. Use as instructed 100 each 2   liraglutide (VICTOZA) 18 MG/3ML SOPN INJECT 0.2 MILLILITERS (1.2 MG TOTAL) UNDER THE SKIN DAILY. 6 mL 6   lisinopril (ZESTRIL) 20 MG tablet Take 20 mg by mouth daily.     meloxicam (MOBIC) 15 MG tablet Take 1 tablet (15 mg total) by mouth daily. 30 tablet 0   metoprolol tartrate (LOPRESSOR) 25 MG tablet Take 1 tablet (25 mg total) by mouth 2 (two) times daily. 180 tablet 2   mupirocin ointment (BACTROBAN) 2 % Apply 1 Application topically 2 (two) times daily. 22 g 2   pantoprazole (PROTONIX) 40 MG tablet Take 1 tablet (40 mg total) by mouth daily. 90 tablet 2   rosuvastatin (CRESTOR) 40 MG tablet Take 1 tablet (40 mg total) by mouth at bedtime. 90 tablet 2   Vitamin D, Ergocalciferol, (DRISDOL) 1.25 MG (50000 UNIT) CAPS capsule Take 1 capsule (50,000 Units total) by mouth every 7  (seven) days. 12 capsule 6   No current facility-administered medications on file prior to visit.   Past Medical History:  Diagnosis Date   Age-related osteoporosis without current pathological fracture 06/18/2019   Allergic rhinitis    BMI 27.0-27.9,adult 08/17/2019   Diabetes (HCC)    Dysphasia following cerebral infarction 06/18/2019   Hyperlipidemia    Hypertension    Mixed hyperlipidemia 06/18/2019   Mixed incontinence 06/18/2019   Sequelae of poliomyelitis 06/18/2019   Type 2 diabetes mellitus with other specified complication (HCC) 06/18/2019   Past Surgical History:  Procedure Laterality Date   ABDOMINAL HYSTERECTOMY     CATARACT EXTRACTION Right    CHOLECYSTECTOMY     ROTATOR CUFF REPAIR Bilateral     History reviewed. No pertinent family history. Social History   Socioeconomic History   Marital status: Widowed    Spouse name: Not on file   Number of children: Not on file   Years of education: Not on file   Highest education level: Not on file  Occupational History   Occupation: Disabled  Tobacco Use   Smoking status: Former    Packs/day: 1.00    Years: 5.00    Additional pack years: 0.00    Total pack years: 5.00    Types: Cigarettes    Quit date: 03/16/1987    Years since quitting: 35.3   Smokeless tobacco: Never  Substance and Sexual Activity   Alcohol use: No   Drug use: No   Sexual activity: Not Currently  Other Topics Concern   Not on file  Social History Narrative   Not on file   Social Determinants of Health   Financial Resource Strain: Low Risk  (02/24/2022)   Overall Financial Resource Strain (CARDIA)    Difficulty of Paying Living Expenses: Not hard at all  Food Insecurity: Not on file  Transportation Needs: No Transportation Needs (02/24/2022)   PRAPARE - Administrator, Civil Service (Medical): No    Lack of Transportation (Non-Medical): No  Physical Activity: Not on file  Stress: Not on file  Social Connections: Not on file     Objective:  PHYSICAL EXAM:   VS: BP 122/80 (BP Location:  Left Arm, Patient Position: Sitting, Cuff Size: Normal)   Pulse (!) 57   Temp (!) 97 F (36.1 C) (Temporal)   Ht 5\' 2"  (1.575 m)   Wt 130 lb 3.2 oz (59.1 kg)   LMP  (LMP Unknown)   SpO2 97%   BMI 23.81 kg/m   GEN: Well nourished, well developed, in no acute distress  Cardiac: RRR; no murmurs, rubs, or gallops,no edema - Respiratory:  normal respiratory rate and pattern with no distress - normal breath sounds with no rales, rhonchi, wheezes or rubs Left breast -- palpable mass noted in left upper outer quadrant - tender to palpation   Lab Results  Component Value Date   WBC 7.6 03/29/2022   HGB 14.6 03/29/2022   HCT 46.5 03/29/2022   PLT 212 03/29/2022   GLUCOSE 123 (H) 03/29/2022   CHOL 184 03/29/2022   TRIG 98 03/29/2022   HDL 68 03/29/2022   LDLCALC 98 03/29/2022   ALT 33 (H) 03/29/2022   AST 40 03/29/2022   NA 143 03/29/2022   K 4.7 03/29/2022   CL 104 03/29/2022   CREATININE 0.85 03/29/2022   BUN 15 03/29/2022   CO2 24 03/29/2022   TSH 2.080 03/29/2022   HGBA1C 7.4 (H) 03/29/2022   MICROALBUR 30 07/21/2020      Assessment & Plan:    Mass of upper outer quadrant of left breast -     MM Digital Diagnostic Bilat; Future -     [place] LIMITED ULTRASOUND INCLUDING AXILLA LEFT BREAST ; Future     No orders of the defined types were placed in this encounter.   Orders Placed This Encounter  Procedures   MM Digital Diagnostic Bilat   [place] LIMITED ULTRASOUND INCLUDING AXILLA LEFT BREAST      Follow-up: Return in about 4 weeks (around 08/05/2022) for fasting chronic follow up.     An After Visit Summary was printed and given to the patient.  Jettie Pagan [facility] [phone]

## 2022-07-08 NOTE — Telephone Encounter (Signed)
Patient made aware of mammogram appointment 07/15/22 at 1:30 AT [facility].

## 2022-07-13 ENCOUNTER — Other Ambulatory Visit: Payer: Self-pay

## 2022-07-13 DIAGNOSIS — R0781 Pleurodynia: Secondary | ICD-10-CM

## 2022-07-13 DIAGNOSIS — N952 Postmenopausal atrophic vaginitis: Secondary | ICD-10-CM

## 2022-07-13 MED ORDER — ESTRADIOL 0.1 MG/GM VA CREA: TOPICAL_CREAM | VAGINAL | 3 refills | Status: DC

## 2022-07-13 MED ORDER — MELOXICAM 15 MG PO TABS
15.0000 mg | ORAL_TABLET | Freq: Every day | ORAL | 0 refills | Status: DC
Start: 2022-07-13 — End: 2022-10-20

## 2022-07-14 ENCOUNTER — Other Ambulatory Visit: Payer: Self-pay

## 2022-07-14 DIAGNOSIS — R0781 Pleurodynia: Secondary | ICD-10-CM

## 2022-07-15 DIAGNOSIS — N6489 Other specified disorders of breast: Secondary | ICD-10-CM | POA: Diagnosis not present

## 2022-07-15 DIAGNOSIS — R92323 Mammographic fibroglandular density, bilateral breasts: Secondary | ICD-10-CM | POA: Diagnosis not present

## 2022-07-15 DIAGNOSIS — N6332 Unspecified lump in axillary tail of the left breast: Secondary | ICD-10-CM | POA: Diagnosis not present

## 2022-07-15 DIAGNOSIS — R32 Unspecified urinary incontinence: Secondary | ICD-10-CM | POA: Diagnosis not present

## 2022-07-16 ENCOUNTER — Ambulatory Visit (INDEPENDENT_AMBULATORY_CARE_PROVIDER_SITE_OTHER): Payer: Medicare HMO | Admitting: Physician Assistant

## 2022-07-16 ENCOUNTER — Encounter: Payer: Self-pay | Admitting: Physician Assistant

## 2022-07-16 VITALS — BP 90/60 | HR 56 | Temp 97.4°F | Resp 14 | Ht 62.0 in | Wt 131.0 lb

## 2022-07-16 DIAGNOSIS — I10 Essential (primary) hypertension: Secondary | ICD-10-CM | POA: Diagnosis not present

## 2022-07-16 DIAGNOSIS — Z794 Long term (current) use of insulin: Secondary | ICD-10-CM | POA: Diagnosis not present

## 2022-07-16 DIAGNOSIS — I700 Atherosclerosis of aorta: Secondary | ICD-10-CM

## 2022-07-16 DIAGNOSIS — E1169 Type 2 diabetes mellitus with other specified complication: Secondary | ICD-10-CM

## 2022-07-16 DIAGNOSIS — M810 Age-related osteoporosis without current pathological fracture: Secondary | ICD-10-CM | POA: Diagnosis not present

## 2022-07-16 DIAGNOSIS — E782 Mixed hyperlipidemia: Secondary | ICD-10-CM | POA: Diagnosis not present

## 2022-07-16 MED ORDER — TRESIBA FLEXTOUCH 200 UNIT/ML ~~LOC~~ SOPN
12.0000 [IU] | PEN_INJECTOR | Freq: Every day | SUBCUTANEOUS | 0 refills | Status: DC
Start: 2022-07-16 — End: 2022-09-13

## 2022-07-16 NOTE — Progress Notes (Signed)
Subjective:  Patient ID: Rebecca Hodge, female    DOB: 06/02/1940  Age: 82 y.o. MRN: [id number]  Chief Complaint  Patient presents with   Diabetes   Hypertension   Hyperlipidemia    HPI Patient is here for a chronic follow up for Dm2, HYPERTENSION and hyperlipidemia  HYPERTENSION: She was last seen for hypertension 1 weeks ago.  Management: lisinopril 20 mg daily and metoprolol 25 mg, two times daily. She is following a Regular diet. She is exercising.walking dog. Doing house chores She does not smoke.  Diabetes Mellitus Type II, Follow-up Management  includes victoza 1.2 mg daily, tresiba 12 U daily, . She reports good compliance with treatment. Last A1C 7.4. Home blood sugar records: fasting range: 100-110 Most Recent Eye Exam: overdue Current exercise: walking Current diet habits: well balanced  Hyperlipid/Cholesterol, Follow-up Management includes crestor 40 mg daily at bed time Current diet: well balanced Current exercise: walking  Osteoporosis: on boniva 150 mg one monthly. On Vitamin D 50K weekly.      07/08/2022    8:51 AM 11/03/2021    2:24 PM 06/22/2021    4:22 PM 06/22/2021    9:23 AM 12/23/2020    3:47 PM  Depression screen PHQ 2/9  Decreased Interest 0 0 0 0 0  Down, Depressed, Hopeless 0 0 0 0 0  PHQ - 2 Score 0 0 0 0 0  Altered sleeping 0 0     Tired, decreased energy 0 0     Change in appetite 0 0     Feeling bad or failure about yourself  0 0     Trouble concentrating 0 0     Moving slowly or fidgety/restless 0 0     Suicidal thoughts 0 0     PHQ-9 Score 0 0     Difficult doing work/chores Not difficult at all            07/08/2022    8:51 AM  Fall Risk   Falls in the past year? 0  Number falls in past yr: 0  Injury with Fall? 0  Risk for fall due to : No Fall Risks  Follow up Falls evaluation completed    Patient Care Team: Marianne Sofia, Cordelia Poche as PCP - General (Physician Assistant) Zettie Pho, [facility] as Pharmacist (Pharmacist)    Review of Systems  Constitutional:  Negative for chills, fatigue and fever.  HENT:  Negative for congestion, ear pain and sore throat.   Respiratory:  Negative for cough and shortness of breath.   Cardiovascular:  Negative for chest pain and palpitations.  Gastrointestinal:  Negative for abdominal pain, constipation, diarrhea, nausea and vomiting.  Endocrine: Negative for polydipsia, polyphagia and polyuria.  Genitourinary:  Negative for difficulty urinating and dysuria.  Musculoskeletal:  Negative for arthralgias, back pain and myalgias.  Skin:  Negative for rash.  Neurological:  Negative for headaches.  Psychiatric/Behavioral:  Negative for dysphoric mood. The patient is not nervous/anxious.     Current Outpatient Medications on File Prior to Visit  Medication Sig Dispense Refill   ACCU-CHEK GUIDE test strip 1 each by Other route daily. Check blood sugar fasting in the morning daily. 100 each 2   BD PEN NEEDLE NANO U/F 32G X 4 MM MISC See admin instructions.     Blood Glucose Monitoring Suppl (ACCU-CHEK GUIDE) w/Device KIT 1 each by Other route daily. 1 kit 0   ezetimibe (ZETIA) 10 MG tablet Take 10 mg by mouth daily.  fluticasone (FLONASE) 50 MCG/ACT nasal spray Place 2 sprays into both nostrils daily. 16 g 6   gabapentin (NEURONTIN) 300 MG capsule Take 1 capsule (300 mg total) by mouth 3 (three) times daily. 270 capsule 2   ibandronate (BONIVA) 150 MG tablet Take 1 tablet (150 mg total) by mouth every 30 (thirty) days. Take in the morning with a full glass of water, on an empty stomach, and do not take anything else by mouth or lie down for the next 30 min. 12 tablet 0   Lancets (ONETOUCH ULTRASOFT) lancets 1 each by Other route daily. Use as instructed 100 each 2   liraglutide (VICTOZA) 18 MG/3ML SOPN INJECT 0.2 MILLILITERS (1.2 MG TOTAL) UNDER THE SKIN DAILY. 6 mL 6   lisinopril (ZESTRIL) 20 MG tablet Take 20 mg by mouth daily.     meloxicam (MOBIC) 15 MG tablet Take 1 tablet  (15 mg total) by mouth daily. 30 tablet 0   metoprolol tartrate (LOPRESSOR) 25 MG tablet Take 1 tablet (25 mg total) by mouth 2 (two) times daily. 180 tablet 2   mupirocin ointment (BACTROBAN) 2 % Apply 1 Application topically 2 (two) times daily. 22 g 2   pantoprazole (PROTONIX) 40 MG tablet Take 1 tablet (40 mg total) by mouth daily. 90 tablet 2   rosuvastatin (CRESTOR) 40 MG tablet Take 1 tablet (40 mg total) by mouth at bedtime. 90 tablet 2   Vitamin D, Ergocalciferol, (DRISDOL) 1.25 MG (50000 UNIT) CAPS capsule Take 1 capsule (50,000 Units total) by mouth every 7 (seven) days. 12 capsule 6   No current facility-administered medications on file prior to visit.   Past Medical History:  Diagnosis Date   Age-related osteoporosis without current pathological fracture 06/18/2019   Allergic rhinitis    BMI 27.0-27.9,adult 08/17/2019   Diabetes (HCC)    Dysphasia following cerebral infarction 06/18/2019   Hyperlipidemia    Hypertension    Mixed hyperlipidemia 06/18/2019   Mixed incontinence 06/18/2019   Sequelae of poliomyelitis 06/18/2019   Type 2 diabetes mellitus with other specified complication (HCC) 06/18/2019   Past Surgical History:  Procedure Laterality Date   ABDOMINAL HYSTERECTOMY     CATARACT EXTRACTION Right    CHOLECYSTECTOMY     ROTATOR CUFF REPAIR Bilateral     History reviewed. No pertinent family history. Social History   Socioeconomic History   Marital status: Widowed    Spouse name: Not on file   Number of children: Not on file   Years of education: Not on file   Highest education level: Not on file  Occupational History   Occupation: Disabled  Tobacco Use   Smoking status: Former    Packs/day: 1.00    Years: 5.00    Additional pack years: 0.00    Total pack years: 5.00    Types: Cigarettes    Quit date: 03/16/1987    Years since quitting: 35.4   Smokeless tobacco: Never  Substance and Sexual Activity   Alcohol use: No   Drug use: No   Sexual activity: Not  Currently  Other Topics Concern   Not on file  Social History Narrative   Not on file   Social Determinants of Health   Financial Resource Strain: Low Risk  (02/24/2022)   Overall Financial Resource Strain (CARDIA)    Difficulty of Paying Living Expenses: Not hard at all  Food Insecurity: Not on file  Transportation Needs: No Transportation Needs (02/24/2022)   PRAPARE - Transportation    Lack of  Transportation (Medical): No    Lack of Transportation (Non-Medical): No  Physical Activity: Not on file  Stress: Not on file  Social Connections: Not on file    Objective:  BP 90/60   Pulse (!) 56   Temp (!) 97.4 F (36.3 C)   Resp 14   Ht 5\' 2"  (1.575 m)   Wt 131 lb (59.4 kg)   LMP  (LMP Unknown)   SpO2 95%   BMI 23.96 kg/m      08/04/2022    9:21 AM 07/16/2022    8:31 AM 07/16/2022    7:56 AM  BP/Weight  Systolic BP 122 90 100  Diastolic BP 62 60 58  Wt. (Lbs)   131  BMI   23.96 kg/m2    Physical Exam Constitutional:      Appearance: Normal appearance.  Cardiovascular:     Rate and Rhythm: Regular rhythm. Bradycardia present.  Pulmonary:     Effort: Pulmonary effort is normal.     Breath sounds: Normal breath sounds.  Neurological:     Mental Status: She is alert and oriented to person, place, and time.  Psychiatric:        Mood and Affect: Mood normal.        Behavior: Behavior normal.     Diabetic Foot Exam - Simple   No data filed      Lab Results  Component Value Date   WBC 8.6 07/16/2022   HGB 13.6 07/16/2022   HCT 43.7 07/16/2022   PLT 190 07/16/2022   GLUCOSE 124 (H) 07/16/2022   CHOL 149 07/16/2022   TRIG 75 07/16/2022   HDL 57 07/16/2022   LDLCALC 77 07/16/2022   ALT 16 07/16/2022   AST 23 07/16/2022   NA 142 07/16/2022   K 4.9 07/16/2022   CL 103 07/16/2022   CREATININE 0.84 07/16/2022   BUN 12 07/16/2022   CO2 26 07/16/2022   TSH 2.080 03/29/2022   HGBA1C 6.9 (H) 07/16/2022   MICROALBUR 30 07/21/2020      Assessment & Plan:     Combined hyperlipidemia associated with type 2 diabetes mellitus (HCC) Assessment & Plan: Control: at goal Recommend check sugars fasting daily. Recommend check feet daily. Recommend annual eye exams. Medicines: Continue victoza 1.2 mg daily and tresiba 12 U daily. Continue to work on eating a healthy diet and exercise.  Labs drawn today.     Orders: -     Hemoglobin A1c -     Lipid panel -     CBC with Differential/Platelet -     Comprehensive metabolic panel -     Evaristo Bury FlexTouch; Inject 12 Units into the skin at bedtime.  Dispense: 6 mL; Refill: 0 -     Cardiovascular Risk Assessment  Primary hypertension Assessment & Plan: Patients current BP was low, but patient stated her HR and BP have been low before when she fasts. Encouraged the patient to check after she eats. If it continues to stay low encouraged her to drink plenty of water and go to the ER if it continues to drop or if she begins to feel dizzy, drowsy, or syncopal.   Patient called and asked to return for nurse visit for bp check.    Age-related osteoporosis without current pathological fracture Assessment & Plan: Continue ibandronate monthly.  Continue calcium with D 1200 mg daily.    Mixed hyperlipidemia Assessment & Plan: Well controlled.  Continue to work on eating a healthy diet and exercise.  Labs drawn today.   No major side effects reported, and no issues with compliance. The current medical regimen is effective;  continue present plan with Rosuvastatin 40mg  before bed and zetia 10 mg before bed.    Orders: -     Lipid panel  Atherosclerosis of aorta (HCC) Assessment & Plan: Continue crestor 40 mg before bed AND ZETIA 10 MG BEFORE BED.  RECOMMEND ASPIRIN 81 MG DAILY.    Long term (current) use of insulin (HCC)     Meds ordered this encounter  Medications   insulin degludec (TRESIBA FLEXTOUCH) 200 UNIT/ML FlexTouch Pen    Sig: Inject 12 Units into the skin at bedtime.    Dispense:   6 mL    Refill:  0    Orders Placed This Encounter  Procedures   Hemoglobin A1c   Lipid panel   CBC with Differential/Platelet   Comprehensive metabolic panel   Cardiovascular Risk Assessment     Follow-up: Return in about 3 months (around 10/16/2022) for chronic fasting.   I,Marla I Leal-Borjas,acting as a scribe for [employer], PA.,have documented all relevant documentation on the behalf of Langley Gauss, PA,as directed by  Langley Gauss, PA while in the presence of Langley Gauss, [place].   An After Visit Summary was printed and given to the patient.  Langley Gauss, [place] [facility] [phone]

## 2022-07-16 NOTE — Assessment & Plan Note (Addendum)
Patients current BP was low, but patient stated her HR and BP have been low before when she fasts. Encouraged the patient to check after she eats. If it continues to stay low encouraged her to drink plenty of water and go to the ER if it continues to drop or if she begins to feel dizzy, drowsy, or syncopal.   Patient called and asked to return for nurse visit for bp check.

## 2022-07-16 NOTE — Assessment & Plan Note (Addendum)
Well controlled.  Continue to work on eating a healthy diet and exercise.  Labs drawn today.   No major side effects reported, and no issues with compliance. The current medical regimen is effective;  continue present plan with Rosuvastatin 40mg  before bed and zetia 10 mg before bed.

## 2022-07-17 LAB — CBC WITH DIFFERENTIAL/PLATELET
Basophils Absolute: 0.1 10*3/uL (ref 0.0–0.2)
Basos: 1 %
EOS (ABSOLUTE): 0.1 10*3/uL (ref 0.0–0.4)
Eos: 2 %
Hematocrit: 43.7 % (ref 34.0–46.6)
Hemoglobin: 13.6 g/dL (ref 11.1–15.9)
Immature Grans (Abs): 0 10*3/uL (ref 0.0–0.1)
Immature Granulocytes: 0 %
Lymphocytes Absolute: 2 10*3/uL (ref 0.7–3.1)
Lymphs: 24 %
MCH: 27.8 pg (ref 26.6–33.0)
MCHC: 31.1 g/dL — ABNORMAL LOW (ref 31.5–35.7)
MCV: 89 fL (ref 79–97)
Monocytes Absolute: 0.7 10*3/uL (ref 0.1–0.9)
Monocytes: 8 %
Neutrophils Absolute: 5.7 10*3/uL (ref 1.4–7.0)
Neutrophils: 65 %
Platelets: 190 10*3/uL (ref 150–450)
RBC: 4.89 x10E6/uL (ref 3.77–5.28)
RDW: 14.2 % (ref 11.7–15.4)
WBC: 8.6 10*3/uL (ref 3.4–10.8)

## 2022-07-17 LAB — COMPREHENSIVE METABOLIC PANEL
ALT: 16 IU/L (ref 0–32)
AST: 23 IU/L (ref 0–40)
Albumin/Globulin Ratio: 2.1 (ref 1.2–2.2)
Albumin: 4.2 g/dL (ref 3.7–4.7)
Alkaline Phosphatase: 54 IU/L (ref 44–121)
BUN/Creatinine Ratio: 14 (ref 12–28)
BUN: 12 mg/dL (ref 8–27)
Bilirubin Total: 0.6 mg/dL (ref 0.0–1.2)
CO2: 26 mmol/L (ref 20–29)
Calcium: 9.1 mg/dL (ref 8.7–10.3)
Chloride: 103 mmol/L (ref 96–106)
Creatinine, Ser: 0.84 mg/dL (ref 0.57–1.00)
Globulin, Total: 2 g/dL (ref 1.5–4.5)
Glucose: 124 mg/dL — ABNORMAL HIGH (ref 70–99)
Potassium: 4.9 mmol/L (ref 3.5–5.2)
Sodium: 142 mmol/L (ref 134–144)
Total Protein: 6.2 g/dL (ref 6.0–8.5)
eGFR: 69 mL/min/{1.73_m2} (ref >59)

## 2022-07-17 LAB — LIPID PANEL
Chol/HDL Ratio: 2.6 ratio (ref 0.0–4.4)
Cholesterol, Total: 149 mg/dL (ref 100–199)
HDL: 57 mg/dL (ref >39)
LDL Chol Calc (NIH): 77 mg/dL (ref 0–99)
Triglycerides: 75 mg/dL (ref 0–149)
VLDL Cholesterol Cal: 15 mg/dL (ref 5–40)

## 2022-07-17 LAB — CARDIOVASCULAR RISK ASSESSMENT

## 2022-07-17 LAB — HEMOGLOBIN A1C
Est. average glucose Bld gHb Est-mCnc: 151 mg/dL
Hgb A1c MFr Bld: 6.9 % — ABNORMAL HIGH (ref 4.8–5.6)

## 2022-07-19 ENCOUNTER — Other Ambulatory Visit: Payer: Self-pay

## 2022-07-19 DIAGNOSIS — N6321 Unspecified lump in the left breast, upper outer quadrant: Secondary | ICD-10-CM

## 2022-07-20 ENCOUNTER — Telehealth: Payer: Self-pay

## 2022-07-20 NOTE — Progress Notes (Signed)
Care Management & Coordination Services Pharmacy Team  Reason for Encounter: Diabetes  Contacted patient to discuss diabetes disease state. Spoke with patient on 07/20/2022   Current antihyperglycemic regimen:  Tresiba Flextouch 200units 12 units at bedtime Victoza 18mg /35ml Inject 0.2ML daily Patient verbally confirms she is taking the above medications as directed. Yes  What diet changes have been made to improve diabetes control? Pt is trying eat more proteins and veggies  What recent interventions/DTPs have been made to improve glycemic control:  Pt was taking off the Levemir and put on Tresiba. She is starting the new shot tonight.   Have there been any recent hospitalizations or ED visits since last visit with PharmD? No  Patient denies hypoglycemic symptoms, including None   Patient reports hyperglycemic symptoms, states when her sugars get above 150 she experiences shakiness and sweatiness  How often are you checking your blood sugar? in the morning before eating or drinking  What are your blood sugars ranging? This week  Fasting: 115, 155, 162, 171, 150  During the week, how often does your blood glucose drop below 70? Never  Are you checking your feet daily/regularly? Yes  Adherence Review: Is the patient currently on a STATIN medication? Yes Is the patient currently on ACE/ARB medication? Yes Does the patient have >5 day gap between last estimated fill dates? Yes   Chart Updates:  Recent office visits:  07/16/22 Craft,Brady PA. Seen for routine visit. Started on Insulin Degludec 12 units daily at bedtime. D/C Insulin Glargine 14 units.   07/08/22 Marianne Sofia PA-C. Seen for Arm problem. No med changes.   Recent consult visits:  None  Hospital visits:  None  Medications: Outpatient Encounter Medications as of 07/20/2022  Medication Sig   ACCU-CHEK GUIDE test strip 1 each by Other route daily. Check blood sugar fasting in the morning daily.   BD PEN NEEDLE NANO  U/F 32G X 4 MM MISC See admin instructions.   Blood Glucose Monitoring Suppl (ACCU-CHEK GUIDE) w/Device KIT 1 each by Other route daily.   estradiol (ESTRACE) 0.1 MG/GM vaginal cream INSERT 1 APPLICATORFUL VAGINALLY 3 TIMES A WEEK   ezetimibe (ZETIA) 10 MG tablet Take 10 mg by mouth daily.   fluticasone (FLONASE) 50 MCG/ACT nasal spray Place 2 sprays into both nostrils daily.   gabapentin (NEURONTIN) 300 MG capsule Take 1 capsule (300 mg total) by mouth 3 (three) times daily.   ibandronate (BONIVA) 150 MG tablet Take 1 tablet (150 mg total) by mouth every 30 (thirty) days. Take in the morning with a full glass of water, on an empty stomach, and do not take anything else by mouth or lie down for the next 30 min.   insulin degludec (TRESIBA FLEXTOUCH) 200 UNIT/ML FlexTouch Pen Inject 12 Units into the skin at bedtime.   Lancets (ONETOUCH ULTRASOFT) lancets 1 each by Other route daily. Use as instructed   liraglutide (VICTOZA) 18 MG/3ML SOPN INJECT 0.2 MILLILITERS (1.2 MG TOTAL) UNDER THE SKIN DAILY.   lisinopril (ZESTRIL) 20 MG tablet Take 20 mg by mouth daily.   meloxicam (MOBIC) 15 MG tablet Take 1 tablet (15 mg total) by mouth daily.   metoprolol tartrate (LOPRESSOR) 25 MG tablet Take 1 tablet (25 mg total) by mouth 2 (two) times daily.   mupirocin ointment (BACTROBAN) 2 % Apply 1 Application topically 2 (two) times daily.   pantoprazole (PROTONIX) 40 MG tablet Take 1 tablet (40 mg total) by mouth daily.   rosuvastatin (CRESTOR) 40 MG tablet Take  1 tablet (40 mg total) by mouth at bedtime.   Vitamin D, Ergocalciferol, (DRISDOL) 1.25 MG (50000 UNIT) CAPS capsule Take 1 capsule (50,000 Units total) by mouth every 7 (seven) days.   No facility-administered encounter medications on file as of 07/20/2022.    Recent Relevant Labs: Lab Results  Component Value Date/Time   HGBA1C 6.9 (H) 07/16/2022 08:48 AM   HGBA1C 7.4 (H) 03/29/2022 08:28 AM   MICROALBUR 30 07/21/2020 11:11 AM   MICROALBUR 30  06/25/2019 09:02 AM    Kidney Function Lab Results  Component Value Date/Time   CREATININE 0.84 07/16/2022 08:48 AM   CREATININE 0.85 03/29/2022 08:28 AM   GFRNONAA 50 (L) 12/24/2019 11:04 AM   GFRAA 57 (L) 12/24/2019 11:04 AM    Star Rating Drugs:  Medication:  Last Fill: Day Supply Victoza              06/08/22-05/09/22 90/30ds  Rosuvastatin   07/13/22-04/12/22 90ds Lisinopril (Pt is not taking this anymore. Pt stated Dr. Marina Goodell D/C this)   Care Gaps: Annual wellness visit in last year? No Last eye exam / retinopathy screening:Overdue since 03/19/20 Last diabetic foot exam:03/29/22   Roxana Hires, [facility] Clinical Pharmacist Assistant  [phone]

## 2022-07-27 ENCOUNTER — Other Ambulatory Visit: Payer: Self-pay

## 2022-07-27 DIAGNOSIS — N952 Postmenopausal atrophic vaginitis: Secondary | ICD-10-CM

## 2022-07-27 MED ORDER — ESTRADIOL 0.1 MG/GM VA CREA
TOPICAL_CREAM | VAGINAL | 3 refills | Status: DC
Start: 2022-07-27 — End: 2022-11-17

## 2022-08-04 ENCOUNTER — Ambulatory Visit: Payer: Medicare HMO

## 2022-08-04 VITALS — BP 122/62

## 2022-08-04 DIAGNOSIS — I952 Hypotension due to drugs: Secondary | ICD-10-CM

## 2022-08-04 NOTE — Progress Notes (Signed)
Patient was here for Blood pressure recheck due to hypotension at last visit. Patient's BP is much better today and consulted with Langley Gauss and patient is ok to return at her next follow up.

## 2022-08-06 NOTE — Assessment & Plan Note (Addendum)
>>  ASSESSMENT AND PLAN FOR COMBINED HYPERLIPIDEMIA ASSOCIATED WITH TYPE 2 DIABETES MELLITUS (HCC) WRITTEN ON 08/06/2022  6:33 PM BY Gini Caputo, MD  Control: at goal Recommend check sugars fasting daily. Recommend check feet daily. Recommend annual eye exams. Medicines: Continue victoza 1.2 mg daily and tresiba 12 U daily. Continue to work on eating a healthy diet and exercise.  Labs drawn today.      >>ASSESSMENT AND PLAN FOR MIXED HYPERLIPIDEMIA WRITTEN ON 08/06/2022  6:34 PM BY Kataleah Bejar, MD  Well controlled.  Continue to work on eating a healthy diet and exercise.  Labs drawn today.   No major side effects reported, and no issues with compliance. The current medical regimen is effective;  continue present plan with Rosuvastatin 40mg  before bed and zetia 10 mg before bed.

## 2022-08-06 NOTE — Assessment & Plan Note (Addendum)
Continue crestor 40 mg before bed AND ZETIA 10 MG BEFORE BED.  RECOMMEND ASPIRIN 81 MG DAILY.

## 2022-08-06 NOTE — Assessment & Plan Note (Signed)
Continue ibandronate monthly.  Continue calcium with D 1200 mg daily.

## 2022-08-13 ENCOUNTER — Telehealth: Payer: Self-pay

## 2022-08-13 NOTE — Progress Notes (Signed)
Care Management & Coordination Services Pharmacy Team  Reason for Encounter: Appointment Reminder  Contacted patient to confirm telephone appointment with Artelia Laroche, PharmD on 08/17/22 at 10:00 am.  Unsuccessful outreach. Left voicemail for patient to return call.  Chart review:  Recent office visits:  None  Recent consult visits:  None  Hospital visits:  None   Star Rating Drugs:  Medication:  Last Fill: Day Supply Victoza   06/08/22-05/09/22 90/30ds Lisinopril   Not taking anymore.   Rosuvastatin  07/13/22-04/12/22  90ds   Care Gaps: Annual wellness visit in last year? No, overdue since 11/12/21 Last eye exam / retinopathy screening:Overdue since 03/19/21 Colonoscopy:None noted    Roxana Hires, [facility] Clinical Pharmacist Assistant  [phone]

## 2022-08-17 ENCOUNTER — Ambulatory Visit: Payer: Medicare HMO

## 2022-08-17 NOTE — Patient Outreach (Cosign Needed Addendum)
Care Management & Coordination Services Pharmacy Note  08/17/2022 Name:  Rebecca Hodge MRN:  [id number] DOB:  [birth year]  Summary: -Pleasant female presents for f/u visit. She lives with her daughter. Has 4 daughters, 12 grandkids, and 12 great grandkids. She likes to walk and camp for fun and has a chiwawa named Cookie she enjoys caring for and walking  Recommendations/Changes made from today's visit: -Patient is supposed to be taking Lisinopril but hasn't taken it in months. Is on Metoprolol but I don't see an indication for this. Recommend taking Lisinopril instead of Metoprolol since her urine albumin is 30. Will co-sign PCP -Patient has been taking both Alendronate and Ibandronate for months. Counseled patient to only take Ibandronate since that's on medslist -However, I recommend an alternative to Ibandronate since this is only effective for Vertebral fractures. Will cosign PCP -Mirtazapine is no longer on medslist but patient has been taking consistently. Will co-sign PCP to see if they still want her on it (I added back to let PCP know it was on EPIC)  Subjective: Rebecca Hodge is an 82 y.o. year old female who is a primary patient of Marianne Sofia, [place].  The care coordination team was consulted for assistance with disease management and care coordination needs.    Engaged with patient by telephone for follow up visit.  Recent office visits:  None   Recent consult visits:  None   Hospital visits:  None   Objective:  Lab Results  Component Value Date   CREATININE 0.84 07/16/2022   BUN 12 07/16/2022   EGFR 69 07/16/2022   GFRNONAA 50 (L) 12/24/2019   GFRAA 57 (L) 12/24/2019   NA 142 07/16/2022   K 4.9 07/16/2022   CALCIUM 9.1 07/16/2022   CO2 26 07/16/2022   GLUCOSE 124 (H) 07/16/2022    Lab Results  Component Value Date/Time   HGBA1C 6.9 (H) 07/16/2022 08:48 AM   HGBA1C 7.4 (H) 03/29/2022 08:28 AM   MICROALBUR 30 07/21/2020 11:11 AM   MICROALBUR 30  06/25/2019 09:02 AM    Last diabetic Eye exam:  Lab Results  Component Value Date/Time   HMDIABEYEEXA No Retinopathy 03/19/2020 12:00 AM    Last diabetic Foot exam: No results found for: "HMDIABFOOTEX"   Lab Results  Component Value Date   CHOL 149 07/16/2022   HDL 57 07/16/2022   LDLCALC 77 07/16/2022   TRIG 75 07/16/2022   CHOLHDL 2.6 07/16/2022       Latest Ref Rng & Units 07/16/2022    8:48 AM 03/29/2022    8:28 AM 10/29/2021    9:01 AM  Hepatic Function  Total Protein 6.0 - 8.5 g/dL 6.2  6.8  6.6   Albumin 3.7 - 4.7 g/dL 4.2  4.6  4.4   AST 0 - 40 IU/L 23  40  20   ALT 0 - 32 IU/L 16  33  17   Alk Phosphatase 44 - 121 IU/L 54  58  63   Total Bilirubin 0.0 - 1.2 mg/dL 0.6  0.9  0.8     Lab Results  Component Value Date/Time   TSH 2.080 03/29/2022 08:28 AM       Latest Ref Rng & Units 07/16/2022    8:48 AM 03/29/2022    8:28 AM 10/29/2021    9:01 AM  CBC  WBC 3.4 - 10.8 x10E3/uL 8.6  7.6  8.4   Hemoglobin 11.1 - 15.9 g/dL 40.9  81.1  91.4   Hematocrit 34.0 -  46.6 % 43.7  46.5  45.4   Platelets 150 - 450 x10E3/uL 190  212  266     Lab Results  Component Value Date/Time   VD25OH 74.8 10/29/2021 09:01 AM   VD25OH 26.3 (L) 06/22/2021 09:55 AM    Clinical ASCVD: Yes  The ASCVD Risk score (Arnett DK, et al., 2019) failed to calculate for the following reasons:   The 2019 ASCVD risk score is only valid for ages 47 to 10    Other: (CHADS2VASc if Afib, MMRC or CAT for COPD, ACT, DEXA)     07/08/2022    8:51 AM 11/03/2021    2:24 PM 06/22/2021    4:22 PM  Depression screen PHQ 2/9  Decreased Interest 0 0 0  Down, Depressed, Hopeless 0 0 0  PHQ - 2 Score 0 0 0  Altered sleeping 0 0   Tired, decreased energy 0 0   Change in appetite 0 0   Feeling bad or failure about yourself  0 0   Trouble concentrating 0 0   Moving slowly or fidgety/restless 0 0   Suicidal thoughts 0 0   PHQ-9 Score 0 0   Difficult doing work/chores Not difficult at all       Social  History   Tobacco Use  Smoking Status Former   Packs/day: 1.00   Years: 5.00   Additional pack years: 0.00   Total pack years: 5.00   Types: Cigarettes   Quit date: 03/16/1987   Years since quitting: 35.4  Smokeless Tobacco Never   BP Readings from Last 3 Encounters:  08/04/22 122/62  07/16/22 90/60  07/08/22 122/80   Pulse Readings from Last 3 Encounters:  07/16/22 (!) 56  07/08/22 (!) 57  03/31/22 63   Wt Readings from Last 3 Encounters:  07/16/22 131 lb (59.4 kg)  07/08/22 130 lb 3.2 oz (59.1 kg)  03/31/22 132 lb 9.6 oz (60.1 kg)   BMI Readings from Last 3 Encounters:  07/16/22 23.96 kg/m  07/08/22 23.81 kg/m  03/31/22 24.25 kg/m    Allergies  Allergen Reactions   Codeine    Morphine And Codeine    Lantus [Insulin Glargine] Itching   Sulfa Antibiotics Itching    Medications Reviewed Today     Reviewed by Langley Gauss, PA (Physician Assistant) on 07/16/22 at [phone]  Med List Status: <None>   Medication Order Taking? Sig Documenting Provider Last Dose Status Informant  ACCU-CHEK GUIDE test strip [id number] Yes 1 each by Other route daily. Check blood sugar fasting in the morning daily. Abigail Miyamoto, MD Taking Active   BD PEN NEEDLE NANO U/F 32G X 4 MM MISC [id number] Yes See admin instructions. [provider] Taking Active   Blood Glucose Monitoring Suppl (ACCU-CHEK GUIDE) w/Device KIT [id number] Yes 1 each by Other route daily. Marianne Sofia, PA-C Taking Active   estradiol (ESTRACE) 0.1 MG/GM vaginal cream [id number] Yes INSERT 1 APPLICATORFUL VAGINALLY 3 TIMES A WEEK Marianne Sofia, PA-C Taking Active   ezetimibe (ZETIA) 10 MG tablet [id number] Yes Take 10 mg by mouth daily. [provider] Taking Active   fluticasone (FLONASE) 50 MCG/ACT nasal spray [id number] Yes Place 2 sprays into both nostrils daily. Lurline Del, FNP Taking Active   gabapentin (NEURONTIN) 300 MG capsule [id number] Yes Take 1 capsule (300 mg total) by mouth 3 (three) times  daily. Marianne Sofia, PA-C Taking Active   ibandronate (BONIVA) 150 MG tablet [id number] Yes Take 1 tablet (150 mg total) by mouth every 30 (  thirty) days. Take in the morning with a full glass of water, on an empty stomach, and do not take anything else by mouth or lie down for the next 30 min. Lurline Del, FNP Taking Active   insulin degludec (TRESIBA FLEXTOUCH) 200 UNIT/ML FlexTouch Pen [id number] Yes Inject 12 Units into the skin at bedtime. Langley Gauss, PA  Active   Lancets [facility] ULTRASOFT) lancets [id number] Yes 1 each by Other route daily. Use as instructed Abigail Miyamoto, MD Taking Active   liraglutide (VICTOZA) 18 MG/3ML Namon Cirri [id number] Yes INJECT 0.2 MILLILITERS (1.2 MG TOTAL) UNDER THE SKIN DAILY. Marianne Sofia, PA-C Taking Active   lisinopril (ZESTRIL) 20 MG tablet [id number] Yes Take 20 mg by mouth daily. [provider] Taking Active   meloxicam (MOBIC) 15 MG tablet [id number] Yes Take 1 tablet (15 mg total) by mouth daily. Marianne Sofia, PA-C Taking Active   metoprolol tartrate (LOPRESSOR) 25 MG tablet [id number] Yes Take 1 tablet (25 mg total) by mouth 2 (two) times daily. Abigail Miyamoto, MD Taking Active   mupirocin ointment [facility]) 2 % [id number] Yes Apply 1 Application topically 2 (two) times daily. Abigail Miyamoto, MD Taking Active   pantoprazole (PROTONIX) 40 MG tablet [id number] Yes Take 1 tablet (40 mg total) by mouth daily. Lurline Del, FNP Taking Active   rosuvastatin (CRESTOR) 40 MG tablet [id number] Yes Take 1 tablet (40 mg total) by mouth at bedtime. Lurline Del, FNP Taking Active   Vitamin D, Ergocalciferol, (DRISDOL) 1.25 MG (50000 UNIT) CAPS capsule [id number] Yes Take 1 capsule (50,000 Units total) by mouth every 7 (seven) days. Abigail Miyamoto, MD Taking Active             SDOH:  (Social Determinants of Health) assessments and interventions performed: Yes SDOH Interventions    Flowsheet Row Care Coordination from 08/17/2022 in  [facility] Chronic Care Management from 02/24/2022 in [facility] Chronic Care Management from 10/20/2021 in [facility] Chronic Care Management from 04/22/2021 in [facility] Office Visit from 06/03/2020 in [facility]  SDOH Interventions       Transportation Interventions Intervention Not Indicated Intervention Not Indicated Intervention Not Indicated Intervention Not Indicated --  Depression Interventions/Treatment  -- -- -- -- Referral to Psychiatry  Financial Strain Interventions Intervention Not Indicated Intervention Not Indicated Intervention Not Indicated Intervention Not Indicated --       Medication Assistance: None required.  Patient affirms current coverage meets needs.  Medication Access: Name and location of current pharmacy:  [facility] - [place], [place] - [address] [place] [address] Phone: [phone] Fax: [phone]  [id number], [place] - [address] AT [facility] [address] [place] [address] Phone: [phone] Fax: [phone]   Compliance/Adherence/Medication fill history: Star Rating Drugs:  Medication:                Last Fill:         Day Supply Victoza                                    06/08/22-05/09/22           90/30ds Lisinopril                      Not  taking anymore.     Rosuvastatin               07/13/22-04/12/22           90ds    Care Gaps: Annual wellness visit in last year? No, overdue since 11/12/21 Last eye exam / retinopathy screening:Overdue since 03/19/21 Colonoscopy:None noted    Assessment/Plan   Hypertension (BP goal <140/90) BP Readings from Last 3 Encounters:  08/04/22 122/62  07/16/22 90/60  07/08/22 122/80    Pulse Readings from Last 3 Encounters:  07/16/22 (!) 56  07/08/22 (!) 57  03/31/22 63  -Controlled -Current treatment: Metoprolol 25mg  BID Query Appropriate,   Lisinopril 20mg  Query Appropriate,  June 2024: Patient not taking -Medications previously tried: Lisinopril (06/22/21, no reason listed) -Current home readings: Not testing -Current dietary habits: "Tries to eat healthy" -Current exercise habits: Walking her dog -Denies hypotensive/hypertensive symptoms -Educated on  Extensive time spent on counseling patient on blood pressure goal and impact of each antihypertensive medication on their blood pressure and risk reduction for CV disease.  Used analogies to explain the need for multiple antihypertensive medications to achieve BP goals and that it is often a silent disease with no symptoms.  -Counseled to monitor BP at home, document, and provide log at future appointments August 2023: Patient was Dc'd from Lisinopril on 06/22/21 but no reason listed in note. Will ask PCP December 2023: Patient is back on Lisinopril and BP looks low. Will ask about Dc'ing Metoprolol as I don't see an indication on problems list regarding arrhthymias  June 2024: Patient is not taking Lisinopril and is on Metoprolol. Has Hx of hypotension. Once again, recommend taking Lisinopril, not Metoprolol   Hyperlipidemia: (LDL goal < 100) The ASCVD Risk score (Arnett DK, et al., 2019) failed to calculate for the following reasons:   The 2019 ASCVD risk score is only valid for ages 83 to 5 Lab Results  Component Value Date   CHOL 149 07/16/2022   CHOL 184 03/29/2022   CHOL 155 10/29/2021   Lab Results  Component Value Date   HDL 57 07/16/2022   HDL 68 03/29/2022   HDL 58 10/29/2021   Lab Results  Component Value Date   LDLCALC 77 07/16/2022   LDLCALC 98 03/29/2022   LDLCALC 77 10/29/2021   Lab Results  Component Value Date   TRIG 75 07/16/2022   TRIG 98 03/29/2022   TRIG 112 10/29/2021   Lab Results  Component Value Date   CHOLHDL 2.6 07/16/2022   CHOLHDL 2.7 03/29/2022   CHOLHDL 2.7 10/29/2021   No results found for: "LDLDIRECT" Last vitamin D Lab  Results  Component Value Date   VD25OH 74.8 10/29/2021   Lab Results  Component Value Date   TSH 2.080 03/29/2022   -Controlled -Current treatment: Rosuvastatin 40mg  Appropriate, Effective, Safe, Accessible Ezetimibe 10mg  Appropriate, Effective, Safe, Accessible -Medications previously tried: N/A  -Current dietary habits: "Tries to eat healthy" -Current exercise habits: Walking her dog -Educated on: Over 15 minutes spent counseling patient on role of hyperlipidemia on heart disease and the impact of each lipid subclass with emphasis on LDL and heart disease risk.  Explained role of statins in prevention of CV disease complications and actual risk for adverse effects with statin treatment.  Counseled patient on genetic component placing them at risk for hyperlipidemia. June 2024: On 07/15/22, Langley Gauss recommended ASA but I don't see it on medslist. Will ask to clarify   Diabetes (A1c goal <8%) Lab Results  Component Value Date   HGBA1C 6.9 (H) 07/16/2022   HGBA1C 7.4 (H) 03/29/2022   HGBA1C 7.4 (H) 10/29/2021   Lab Results  Component Value Date   MICROALBUR 30 07/21/2020   LDLCALC 77 07/16/2022   CREATININE 0.84 07/16/2022    Lab Results  Component Value Date   NA 142 07/16/2022   K 4.9 07/16/2022   CREATININE 0.84 07/16/2022   EGFR 69 07/16/2022   GFRNONAA 50 (L) 12/24/2019   GLUCOSE 124 (H) 07/16/2022    Lab Results  Component Value Date   WBC 8.6 07/16/2022   HGB 13.6 07/16/2022   HCT 43.7 07/16/2022   MCV 89 07/16/2022   PLT 190 07/16/2022    Lab Results  Component Value Date   LABMICR CANCELED 03/29/2022   LABMICR 17.5 06/22/2021   MICROALBUR 30 07/21/2020   MICROALBUR 30 06/25/2019   -Controlled -Current medications: Liraglutide 1.8mg  Appropriate, Effective, Safe, Accessible Tresiba 12 units QD Appropriate, Effective, Safe, Accessible -Medications previously tried: N/A  -Current home glucose readings (Tests daily) fasting glucose:  August  2023: Doesn't write sugars down. Counseled extensively that she needs to start. Especially since when I asked her what she remembered she told me a week ago Wed her sugars were 195, then when I asked her what day her sugars were in the 70's she said Wed December 2023: 103 182 126 112 172 235 69 post prandial glucose: only tests fasting -Denies hypoglycemic/hyperglycemic symptoms -Current meal patterns:  breakfast: Didn't specify  lunch: Didn't specify dinner:Didn't specify snacks: Didn't specify drinks: Didn't specify -Current exercise: Walks dog -Educated on A1c and blood sugar goals;             *Extensive time was spent counseling patient on the pathophysiology and risk for complications with uncontrolled Diabetes.  Used teach back method for counseling on ADA diabetic diet.  Time spent on explaining role of carbohydrates versus sugars impacting blood glucose levels and meal planning. Current exercise consists of walking 10 minutes three times a week, plan is to increase to 20 minutes 5 days a week and then after 2-3 weeks increase to 30 minute 5 days a week.  -Counseled to check feet daily and get yearly eye exams Sept 2022:  -Will ask PCP which Liraglutide dose he wishes patient to be on -Will have Concierge check on patient's sugars monthly until we get her below 7 -Will have patient try to schedule f/u with PCP, has no future visits Feb 2023: Will ask PCP about Liraglutide again August 2023: Patient has no f/u with PCP. Stated her sugars drop to 50's once/month. Forwarded msg to PCP and Leia Alf about scheduling f/u ASAP to assess. -Recommend DC Glimepiride since already on insulin December 2023: Patient states she got something in the mail that states she can no longer get her insulin because it's only for weight loss, but that Dr. Marina Goodell fixed it yesterday. I am unsure what that is and I asked if she meant Levemir or Victoza and she said Levemir. I asked her to bring in paper in 2  weeks (Couldn't find paper today) for f/u. Also, her sugars are still dropping. She stated PCP didn't ask about sugar levels yesterday and I don't see an A1c. Will let Rehka know who has f/u in 2 weeks. Recommend Dc'ing Glimepiride to help with hypoglycemia June 2024: Patient has no sugar numbers but stated no hypoglycemia   Depression/Anxiety    07/08/2022    8:51 AM 11/03/2021    2:24 PM  06/22/2021    4:22 PM  Depression screen PHQ 2/9  Decreased Interest 0 0 0  Down, Depressed, Hopeless 0 0 0  PHQ - 2 Score 0 0 0  Altered sleeping 0 0   Tired, decreased energy 0 0   Change in appetite 0 0   Feeling bad or failure about yourself  0 0   Trouble concentrating 0 0   Moving slowly or fidgety/restless 0 0   Suicidal thoughts 0 0   PHQ-9 Score 0 0   Difficult doing work/chores Not difficult at all        11/12/2020    6:33 PM  GAD 7 : Generalized Anxiety Score  Nervous, Anxious, on Edge 0  Control/stop worrying 0  Worry too much - different things 0  Trouble relaxing 0  Restless 0  Easily annoyed or irritable 0  Afraid - awful might happen 0  Total GAD 7 Score 0   -Controlled -Current treatment: Mirtazapine 15mg  Query Appropriate,  -Medications previously tried/failed:  -Educated on Benefits of medication for symptom control Sept 2022: Unsure when mirtazapine was started and patient is non-compliant, will ask PCP if it's needed (Considering her age and risk factors) Recommend continue on non-therapy December 2023: Will defer to PCP June 2024: Mirtazapine was no longer on medslist but patient was taking it. Will ask PCP how to proceed (I added back to medslist since patient was taking)   Osteoporosis / Osteopenia (Goal Prevent fracture) -Not ideally controlled -Last DEXA Scan:  Dual Femur Neck Left 03/11/22: -2.6 08/06/20: -2.5 09-21-17: -2.9 04/11/09: -2.4 03/06/07: -2.2 12/03/05: -2.1 12/19/03: -2.0 03/30/02: -2.0 11/02/01: -1.9 Dual Femur Total Mean 03/11/22:  -1.9 08/06/20: -1.9 09/21/17: -2.3 04/11/09: -1.6 03/06/07: -1.5 12/03/05: -1.7 12/19/03: -1.6 03/30/02: -1.5 8/21/3: -1.6 Left Forearm Radius 33% 03/11/22: -2.9 08/06/20: -2.1 09/21/17: -2.2 -Current treatment  Ibandronate 150mg  Query Appropriate,  Started Jan, 2024. Alendronate started 2022 -Medications previously tried: Alendronate (Started 2022) unable to swallow pills -Recommend [phone] units of vitamin D daily. Recommend 1200 mg of calcium daily from dietary and supplemental sources. June 2024: Recommend alternative to Ibandronate due to this only helping the spine  CP F/U PRN  Artelia Laroche, Pharm.D. - [phone]

## 2022-09-02 ENCOUNTER — Other Ambulatory Visit: Payer: Self-pay | Admitting: Physician Assistant

## 2022-09-02 DIAGNOSIS — E1169 Type 2 diabetes mellitus with other specified complication: Secondary | ICD-10-CM

## 2022-09-07 ENCOUNTER — Telehealth: Payer: Self-pay

## 2022-09-07 ENCOUNTER — Other Ambulatory Visit: Payer: Self-pay

## 2022-09-07 ENCOUNTER — Ambulatory Visit (INDEPENDENT_AMBULATORY_CARE_PROVIDER_SITE_OTHER): Payer: Medicare HMO

## 2022-09-07 VITALS — BP 120/68 | HR 56 | Resp 16 | Ht 62.0 in | Wt 130.0 lb

## 2022-09-07 DIAGNOSIS — Z0000 Encounter for general adult medical examination without abnormal findings: Secondary | ICD-10-CM | POA: Diagnosis not present

## 2022-09-07 NOTE — Telephone Encounter (Signed)
Request already in

## 2022-09-07 NOTE — Progress Notes (Signed)
Subjective:   EMSLEE Hodge is a 82 y.o. female who presents for Medicare Annual (Subsequent) preventive examination.  Visit Complete: In person This wellness visit is conducted by a nurse.  The patient's medications were reviewed and reconciled since the patient's last visit.  History details were provided by the patient and her daughter.  The history appears to be reliable.    Medical History: Patient history and Family history was reviewed  Medications, Allergies, and preventative health maintenance was reviewed and updated.  Cardiac Risk Factors include: advanced age (>49men, >36 women);diabetes mellitus     Objective:    Today's Vitals   09/07/22 0929  BP: 120/68  Pulse: (!) 56  Resp: 16  SpO2: 95%  Weight: 130 lb (59 kg)  Height: 5\' 2"  (1.575 m)  PainSc: 0-No pain   Body mass index is 23.78 kg/m.  Current Medications (verified) Outpatient Encounter Medications as of 09/07/2022  Medication Sig   ACCU-CHEK GUIDE test strip 1 each by Other route daily. Check blood sugar fasting in the morning daily.   alendronate (FOSAMAX) 70 MG tablet Take 70 mg by mouth once a week.   BD PEN NEEDLE NANO U/F 32G X 4 MM MISC See admin instructions.   Blood Glucose Monitoring Suppl (ACCU-CHEK GUIDE) w/Device KIT 1 each by Other route daily.   estradiol (ESTRACE) 0.1 MG/GM vaginal cream INSERT 1 APPLICATORFUL VAGINALLY 3 TIMES A WEEK   ezetimibe (ZETIA) 10 MG tablet Take 10 mg by mouth daily.   fluticasone (FLONASE) 50 MCG/ACT nasal spray Place 2 sprays into both nostrils daily.   gabapentin (NEURONTIN) 300 MG capsule Take 1 capsule (300 mg total) by mouth 3 (three) times daily.   ibandronate (BONIVA) 150 MG tablet Take 1 tablet (150 mg total) by mouth every 30 (thirty) days. Take in the morning with a full glass of water, on an empty stomach, and do not take anything else by mouth or lie down for the next 30 min.   insulin degludec (TRESIBA FLEXTOUCH) 200 UNIT/ML FlexTouch Pen Inject 12  Units into the skin at bedtime.   Lancets (ONETOUCH ULTRASOFT) lancets 1 each by Other route daily. Use as instructed   liraglutide (VICTOZA) 18 MG/3ML SOPN INJECT 0.2 MILLILITERS (1.2 MG TOTAL) UNDER THE SKIN DAILY.   lisinopril (ZESTRIL) 20 MG tablet Take 20 mg by mouth daily.   meloxicam (MOBIC) 15 MG tablet Take 1 tablet (15 mg total) by mouth daily.   metoprolol tartrate (LOPRESSOR) 25 MG tablet Take 1 tablet (25 mg total) by mouth 2 (two) times daily.   mirtazapine (REMERON) 15 MG tablet Take 15 mg by mouth at bedtime.   mupirocin ointment (BACTROBAN) 2 % Apply 1 Application topically 2 (two) times daily.   pantoprazole (PROTONIX) 40 MG tablet Take 1 tablet (40 mg total) by mouth daily.   rosuvastatin (CRESTOR) 40 MG tablet Take 1 tablet (40 mg total) by mouth at bedtime.   Vitamin D, Ergocalciferol, (DRISDOL) 1.25 MG (50000 UNIT) CAPS capsule Take 1 capsule (50,000 Units total) by mouth every 7 (seven) days.   No facility-administered encounter medications on file as of 09/07/2022.    Allergies (verified) Codeine, Morphine and codeine, Lantus [insulin glargine], and Sulfa antibiotics   History: Past Medical History:  Diagnosis Date   Age-related osteoporosis without current pathological fracture 06/18/2019   Allergic rhinitis    BMI 27.0-27.9,adult 08/17/2019   Diabetes (HCC)    Dysphasia following cerebral infarction 06/18/2019   Hyperlipidemia    Hypertension  Mixed hyperlipidemia 06/18/2019   Mixed incontinence 06/18/2019   Sequelae of poliomyelitis 06/18/2019   Type 2 diabetes mellitus with other specified complication (HCC) 06/18/2019   Past Surgical History:  Procedure Laterality Date   ABDOMINAL HYSTERECTOMY     CATARACT EXTRACTION Bilateral    CHOLECYSTECTOMY     ROTATOR CUFF REPAIR Bilateral    No family history on file. Social History   Socioeconomic History   Marital status: Widowed    Spouse name: Not on file   Number of children: Not on file   Years of education:  Not on file   Highest education level: 8th grade  Occupational History   Occupation: Disabled  Tobacco Use   Smoking status: Former    Packs/day: 1.00    Years: 5.00    Additional pack years: 0.00    Total pack years: 5.00    Types: Cigarettes    Quit date: 03/16/1987    Years since quitting: 35.5   Smokeless tobacco: Never  Vaping Use   Vaping Use: Never used  Substance and Sexual Activity   Alcohol use: No   Drug use: No   Sexual activity: Not Currently  Other Topics Concern   Not on file  Social History Narrative   Not on file   Social Determinants of Health   Financial Resource Strain: Low Risk  (09/07/2022)   Overall Financial Resource Strain (CARDIA)    Difficulty of Paying Living Expenses: Not hard at all  Food Insecurity: No Food Insecurity (09/07/2022)   Hunger Vital Sign    Worried About Running Out of Food in the Last Year: Never true    Ran Out of Food in the Last Year: Never true  Transportation Needs: No Transportation Needs (09/07/2022)   PRAPARE - Administrator, Civil Service (Medical): No    Lack of Transportation (Non-Medical): No  Physical Activity: Sufficiently Active (09/07/2022)   Exercise Vital Sign    Days of Exercise per Week: 5 days    Minutes of Exercise per Session: 30 min  Stress: No Stress Concern Present (09/07/2022)   [employer] of Occupational Health - Occupational Stress Questionnaire    Feeling of Stress : Not at all  Social Connections: Moderately Isolated (09/07/2022)   Social Connection and Isolation Panel [NHANES]    Frequency of Communication with Friends and Family: Three times a week    Frequency of Social Gatherings with Friends and Family: Twice a week    Attends Religious Services: More than 4 times per year    Active Member of [employer] or Organizations: No    Attends Banker Meetings: Never    Marital Status: Widowed    Tobacco Counseling Counseling given: Not Answered   Clinical  Intake:  Pre-visit preparation completed: Yes  Pain : No/denies pain Pain Score: 0-No pain     BMI - recorded: 23.78 Nutritional Status: BMI of 19-24  Normal Nutritional Risks: None Diabetes: Yes (most recent A1C 6.9) CBG done?: No Did pt. bring in CBG monitor from home?: No  How often do you need to have someone help you when you read instructions, pamphlets, or other written materials from your doctor or pharmacy?: 2 - Rarely  Interpreter Needed?: No      Activities of Daily Living    09/07/2022    9:24 AM  In your present state of health, do you have any difficulty performing the following activities:  Hearing? 1  Vision? 0  Difficulty concentrating or making  decisions? 1  Walking or climbing stairs? 0  Dressing or bathing? 0  Doing errands, shopping? 0  Preparing Food and eating ? N  Using the Toilet? N  In the past six months, have you accidently leaked urine? Y  Do you have problems with loss of bowel control? N  Managing your Medications? N  Managing your Finances? N  Housekeeping or managing your Housekeeping? N    Patient Care Team: Langley Gauss, [place] as PCP - General (Physician Assistant) Zettie Pho, [facility] (Inactive) as Pharmacist (Pharmacist)  Indicate any recent Medical Services you may have received from other than [facility] providers in the past year (date may be approximate).     Assessment:   This is a routine wellness examination for Rebecca Hodge.  Hearing/Vision screen No results found.  Dietary issues and exercise activities discussed:     Goals Addressed   None    Depression Screen    09/07/2022    9:23 AM 07/08/2022    8:51 AM 11/03/2021    2:24 PM 06/22/2021    4:22 PM 06/22/2021    9:23 AM 12/23/2020    3:47 PM 11/12/2020    6:33 PM  PHQ 2/9 Scores  PHQ - 2 Score 0 0 0 0 0 0 0  PHQ- 9 Score  0 0    0    Fall Risk    09/07/2022    9:23 AM 07/08/2022    8:51 AM 06/22/2021    9:23 AM 12/23/2020    3:47 PM 11/12/2020    6:33 PM   Fall Risk   Falls in the past year? 0 0 0 0 0  Number falls in past yr: 0 0 0 0 0  Injury with Fall? 0 0 0 0 0  Risk for fall due to : No Fall Risks No Fall Risks  No Fall Risks No Fall Risks  Follow up Falls evaluation completed;Education provided Falls evaluation completed  Falls evaluation completed Falls evaluation completed    MEDICARE RISK AT HOME:  Medicare Risk at Home - 09/07/22 0923     Any stairs in or around the home? Yes    If so, are there any without handrails? No    Home free of loose throw rugs in walkways, pet beds, electrical cords, etc? Yes    Adequate lighting in your home to reduce risk of falls? Yes    Life alert? No    Use of a cane, walker or w/c? No    Grab bars in the bathroom? No    Shower chair or bench in shower? No    Elevated toilet seat or a handicapped toilet? No             TIMED UP AND GO:  Was the test performed?  Yes  Length of time to ambulate 10 feet: 6 sec Gait steady and fast without use of assistive device    Cognitive Function:        09/07/2022    9:36 AM 11/12/2020    6:35 PM  6CIT Screen  What Year? 0 points 4 points  What month? 0 points 0 points  What time? 0 points 0 points  Count back from 20 2 points 0 points  Months in reverse 4 points 4 points  Repeat phrase 4 points 4 points  Total Score 10 points 12 points    Immunizations Immunization History  Administered Date(s) Administered   Fluad Quad(high Dose 65+) 12/24/2019, 03/29/2022   Influenza Split  12/14/2011, 12/13/2012   Influenza-Unspecified 11/14/2020, 11/03/2021   Moderna SARS-COV2 Booster Vaccination 06/03/2020   PFIZER(Purple Top)SARS-COV-2 Vaccination 06/21/2019, 07/16/2019   Pneumococcal Conjugate-13 12/29/2013, 08/31/2017   Pneumococcal Polysaccharide-23 06/11/2016   Zoster Recombinat (Shingrix) 09/10/2017, 11/11/2017    TDAP status: Due, Education has been provided regarding the importance of this vaccine. Advised may receive this vaccine at  local pharmacy or Health Dept. Aware to provide a copy of the vaccination record if obtained from local pharmacy or Health Dept. Verbalized acceptance and understanding.  Flu Vaccine status: Up to date  Pneumococcal vaccine status: Up to date  Covid-19 vaccine status: Completed vaccines  Qualifies for Shingles Vaccine? Yes   Zostavax completed No   Shingrix Completed?: Yes  Screening Tests Health Maintenance  Topic Date Due   DTaP/Tdap/Td (1 - Tdap) Never done   OPHTHALMOLOGY EXAM  03/19/2021   Medicare Annual Wellness (AWV)  11/12/2021   COVID-19 Vaccine (4 - 2023-24 season) 11/13/2021   INFLUENZA VACCINE  10/14/2022   HEMOGLOBIN A1C  01/16/2023   Diabetic kidney evaluation - Urine ACR  03/30/2023   FOOT EXAM  03/30/2023   Diabetic kidney evaluation - eGFR measurement  07/16/2023   MAMMOGRAM  07/19/2023   Pneumonia Vaccine 9+ Years old  Completed   DEXA SCAN  Completed   Zoster Vaccines- Shingrix  Completed   HPV VACCINES  Aged Out    Health Maintenance  Health Maintenance Due  Topic Date Due   DTaP/Tdap/Td (1 - Tdap) Never done   OPHTHALMOLOGY EXAM  03/19/2021   Medicare Annual Wellness (AWV)  11/12/2021   COVID-19 Vaccine (4 - 2023-24 season) 11/13/2021    Colorectal cancer screening: No longer required.   Mammogram status: Completed 07/19/22. Repeat every year  Bone Density status: Completed 03/11/22. Results reflect: Bone density results: OSTEOPOROSIS. Repeat every 2 years.  Lung Cancer Screening: (Low Dose CT Chest recommended if Age 31-80 years, 20 pack-year currently smoking OR have quit w/in 15years.) does not qualify.   Lung Cancer Screening Referral: N/A  Additional Screening:  Vision Screening: Recommended annual ophthalmology exams for early detection of glaucoma and other disorders of the eye. Is the patient up to date with their annual eye exam?  No  Who is the provider or what is the name of the office in which the patient attends annual eye  exams? [facility]  Dental Screening: Recommended annual dental exams for proper oral hygiene   Community Resource Referral / Chronic Care Management: CRR required this visit?  No   CCM required this visit?  No     Plan:    1- Patient aware Tetanus vaccine available at the pharmacy 2- Aim for 30 minutes of exercise or brisk walking, 6-8 glasses of water, and 5 servings of fruits and vegetables each day.  3- Counseling was provided today regarding the following topics: healthy eating habits, home safety, vitamin and mineral supplementation (calcium and Vit D), regular exercise, breast self-exam, tobacco avoidance, limitation of alcohol intake, use of seat belts, firearm safety, and fall prevention.  Annual recommendations include: influenza vaccine, dental cleanings, and eye exams.   I have personally reviewed and noted the following in the patient's chart:   Medical and social history Use of alcohol, tobacco or illicit drugs  Current medications and supplements including opioid prescriptions.  Functional ability and status Nutritional status Physical activity Advanced directives List of other physicians Hospitalizations, surgeries, and ER visits in previous 12 months Vitals Screenings to include cognitive, depression, and falls Referrals and  appointments  In addition, I have reviewed and discussed with patient certain preventive protocols, quality metrics, and best practice recommendations. A written personalized care plan for preventive services as well as general preventive health recommendations were provided to patient.     Jacklynn Bue, LPN   1/61/0960   After Visit Summary: declined

## 2022-09-07 NOTE — Telephone Encounter (Signed)
Prescription Request  09/07/2022  LOV: Visit date not found  What is the name of the medication or equipment?  insulin degludec (TRESIBA FLEXTOUCH) 200 UNIT/ML FlexTouch Pe   The patient is currently out of the medication.  Have you contacted your pharmacy to request a refill? Yes   Which pharmacy would you like this sent to?   [facility] [phone] - [place], [place] - [address] AT [facility] [address] [place] [address] Phone: [phone] Fax: [phone]    Patient notified that their request is being sent to the clinical staff for review and that they should receive a response within 2 business days.   Please advise at [facility] [phone]

## 2022-09-10 ENCOUNTER — Other Ambulatory Visit: Payer: Self-pay | Admitting: Physician Assistant

## 2022-09-10 DIAGNOSIS — E1169 Type 2 diabetes mellitus with other specified complication: Secondary | ICD-10-CM

## 2022-09-13 ENCOUNTER — Other Ambulatory Visit: Payer: Self-pay | Admitting: Physician Assistant

## 2022-09-13 ENCOUNTER — Other Ambulatory Visit: Payer: Self-pay

## 2022-09-13 DIAGNOSIS — R32 Unspecified urinary incontinence: Secondary | ICD-10-CM | POA: Diagnosis not present

## 2022-09-13 DIAGNOSIS — E1169 Type 2 diabetes mellitus with other specified complication: Secondary | ICD-10-CM

## 2022-09-13 MED ORDER — TRESIBA FLEXTOUCH 200 UNIT/ML ~~LOC~~ SOPN
12.0000 [IU] | PEN_INJECTOR | Freq: Every day | SUBCUTANEOUS | 0 refills | Status: DC
Start: 2022-09-13 — End: 2022-10-20

## 2022-09-22 DIAGNOSIS — R0981 Nasal congestion: Secondary | ICD-10-CM | POA: Diagnosis not present

## 2022-09-22 DIAGNOSIS — J069 Acute upper respiratory infection, unspecified: Secondary | ICD-10-CM | POA: Diagnosis not present

## 2022-09-24 ENCOUNTER — Telehealth: Payer: Self-pay | Admitting: Physician Assistant

## 2022-09-24 NOTE — Telephone Encounter (Signed)
[facility] incontinence order.

## 2022-09-27 ENCOUNTER — Telehealth: Payer: Self-pay

## 2022-09-27 ENCOUNTER — Telehealth: Payer: Self-pay | Admitting: Physician Assistant

## 2022-09-27 NOTE — Telephone Encounter (Signed)
[facility]: incontinence order

## 2022-09-27 NOTE — Telephone Encounter (Signed)
[facility] 09/27/22

## 2022-09-27 NOTE — Telephone Encounter (Signed)
Order needed for incontinent supplies.  They are going to refax the form that is needed.

## 2022-09-28 NOTE — Telephone Encounter (Signed)
Per Dr. Sedalia Muta patient will need an appointment before the [facility] order can be signed. The patient does have an upcoming appointment scheduled for 8/7 with [facility]. Unsure if she is able to wait until then if not we can get her a sooner appointment just for this.  LM for the patient to call the office back

## 2022-09-29 ENCOUNTER — Telehealth: Payer: Self-pay

## 2022-09-29 NOTE — Telephone Encounter (Signed)
Patient called and left message stating that there was a form from [facility] that was sent over for patient to have pull ups and according to our records a form for [facility] has be filled out by provider and faxed. I called patient back and had to leave voicemail and informed the patient that the form has been filled out and faxed back to [facility] and if there was still any questions she would need to contact them to let them know that we have faxed it back.

## 2022-10-14 DIAGNOSIS — R32 Unspecified urinary incontinence: Secondary | ICD-10-CM | POA: Diagnosis not present

## 2022-10-16 NOTE — Progress Notes (Signed)
Subjective:  Patient ID: Rebecca Hodge, female    DOB: 1941-01-21  Age: 82 y.o. MRN: [id number]  No chief complaint on file.   HPI   Patient is here for a chronic follow up for Dm2, HYPERTENSION and hyperlipidemia  Diabetes Mellitus Type II, Follow-up Management  includes victoza 1.2 mg daily, tresiba 12 U daily, . She reports good compliance with treatment. Last A1C: 6.9 Home blood sugar records: fasting range: 100-110 Most Recent Eye Exam: overdue    HYPERTENSION: Management: lisinopril 20 mg daily and metoprolol 25 mg, two times daily.   Hyperlipid/Cholesterol, Follow-up Management includes crestor 40 mg daily at bed time   Current diet: well balanced Current exercise: walking  Osteoporosis: on boniva 150 mg one monthly. On Vitamin D 50K weekly.     09/07/2022    9:23 AM 07/08/2022    8:51 AM 11/03/2021    2:24 PM 06/22/2021    4:22 PM 06/22/2021    9:23 AM  Depression screen PHQ 2/9  Decreased Interest 0 0 0 0 0  Down, Depressed, Hopeless 0 0 0 0 0  PHQ - 2 Score 0 0 0 0 0  Altered sleeping  0 0    Tired, decreased energy  0 0    Change in appetite  0 0    Feeling bad or failure about yourself   0 0    Trouble concentrating  0 0    Moving slowly or fidgety/restless  0 0    Suicidal thoughts  0 0    PHQ-9 Score  0 0    Difficult doing work/chores  Not difficult at all           09/07/2022    9:23 AM  Fall Risk   Falls in the past year? 0  Number falls in past yr: 0  Injury with Fall? 0  Risk for fall due to : No Fall Risks  Follow up Falls evaluation completed;Education provided    Patient Care Team: Langley Gauss, [place] as PCP - General (Physician Assistant) Zettie Pho, [facility] (Inactive) as Pharmacist (Pharmacist)   Review of Systems  Current Outpatient Medications on File Prior to Visit  Medication Sig Dispense Refill   ACCU-CHEK GUIDE test strip 1 each by Other route daily. Check blood sugar fasting in the morning daily. 100 each 2    alendronate (FOSAMAX) 70 MG tablet Take 70 mg by mouth once a week.     BD PEN NEEDLE NANO U/F 32G X 4 MM MISC See admin instructions.     Blood Glucose Monitoring Suppl (ACCU-CHEK GUIDE) w/Device KIT 1 each by Other route daily. 1 kit 0   estradiol (ESTRACE) 0.1 MG/GM vaginal cream INSERT 1 APPLICATORFUL VAGINALLY 3 TIMES A WEEK 42.5 g 3   ezetimibe (ZETIA) 10 MG tablet Take 10 mg by mouth daily.     fluticasone (FLONASE) 50 MCG/ACT nasal spray Place 2 sprays into both nostrils daily. 16 g 6   gabapentin (NEURONTIN) 300 MG capsule Take 1 capsule (300 mg total) by mouth 3 (three) times daily. 270 capsule 2   ibandronate (BONIVA) 150 MG tablet Take 1 tablet (150 mg total) by mouth every 30 (thirty) days. Take in the morning with a full glass of water, on an empty stomach, and do not take anything else by mouth or lie down for the next 30 min. 12 tablet 0   insulin degludec (TRESIBA FLEXTOUCH) 200 UNIT/ML FlexTouch Pen Inject 12 Units into the skin at  bedtime. 6 mL 0   Lancets (ONETOUCH ULTRASOFT) lancets 1 each by Other route daily. Use as instructed 100 each 2   liraglutide (VICTOZA) 18 MG/3ML SOPN INJECT 0.2 MILLILITERS (1.2 MG TOTAL) UNDER THE SKIN DAILY. 6 mL 6   lisinopril (ZESTRIL) 20 MG tablet Take 20 mg by mouth daily.     meloxicam (MOBIC) 15 MG tablet Take 1 tablet (15 mg total) by mouth daily. 30 tablet 0   metoprolol tartrate (LOPRESSOR) 25 MG tablet Take 1 tablet (25 mg total) by mouth 2 (two) times daily. 180 tablet 2   mirtazapine (REMERON) 15 MG tablet Take 15 mg by mouth at bedtime.     mupirocin ointment (BACTROBAN) 2 % Apply 1 Application topically 2 (two) times daily. 22 g 2   pantoprazole (PROTONIX) 40 MG tablet Take 1 tablet (40 mg total) by mouth daily. 90 tablet 2   rosuvastatin (CRESTOR) 40 MG tablet Take 1 tablet (40 mg total) by mouth at bedtime. 90 tablet 2   Vitamin D, Ergocalciferol, (DRISDOL) 1.25 MG (50000 UNIT) CAPS capsule Take 1 capsule (50,000 Units total) by  mouth every 7 (seven) days. 12 capsule 6   No current facility-administered medications on file prior to visit.   Past Medical History:  Diagnosis Date   Age-related osteoporosis without current pathological fracture 06/18/2019   Allergic rhinitis    BMI 27.0-27.9,adult 08/17/2019   Diabetes (HCC)    Dysphasia following cerebral infarction 06/18/2019   Hyperlipidemia    Hypertension    Mixed hyperlipidemia 06/18/2019   Mixed incontinence 06/18/2019   Sequelae of poliomyelitis 06/18/2019   Type 2 diabetes mellitus with other specified complication (HCC) 06/18/2019   Past Surgical History:  Procedure Laterality Date   ABDOMINAL HYSTERECTOMY     CATARACT EXTRACTION Bilateral    CHOLECYSTECTOMY     ROTATOR CUFF REPAIR Bilateral     No family history on file. Social History   Socioeconomic History   Marital status: Widowed    Spouse name: Not on file   Number of children: Not on file   Years of education: Not on file   Highest education level: 8th grade  Occupational History   Occupation: Disabled  Tobacco Use   Smoking status: Former    Current packs/day: 0.00    Average packs/day: 1 pack/day for 5.0 years (5.0 ttl pk-yrs)    Types: Cigarettes    Start date: 03/15/1982    Quit date: 03/16/1987    Years since quitting: 35.6   Smokeless tobacco: Never  Vaping Use   Vaping status: Never Used  Substance and Sexual Activity   Alcohol use: No   Drug use: No   Sexual activity: Not Currently  Other Topics Concern   Not on file  Social History Narrative   Not on file   Social Determinants of Health   Financial Resource Strain: Low Risk  (09/07/2022)   Overall Financial Resource Strain (CARDIA)    Difficulty of Paying Living Expenses: Not hard at all  Food Insecurity: No Food Insecurity (09/07/2022)   Hunger Vital Sign    Worried About Running Out of Food in the Last Year: Never true    Ran Out of Food in the Last Year: Never true  Transportation Needs: No Transportation Needs  (09/07/2022)   PRAPARE - Administrator, Civil Service (Medical): No    Lack of Transportation (Non-Medical): No  Physical Activity: Sufficiently Active (09/07/2022)   Exercise Vital Sign    Days of Exercise  per Week: 5 days    Minutes of Exercise per Session: 30 min  Stress: No Stress Concern Present (09/07/2022)   [employer] of Occupational Health - Occupational Stress Questionnaire    Feeling of Stress : Not at all  Social Connections: Moderately Isolated (09/07/2022)   Social Connection and Isolation Panel [NHANES]    Frequency of Communication with Friends and Family: Three times a week    Frequency of Social Gatherings with Friends and Family: Twice a week    Attends Religious Services: More than 4 times per year    Active Member of [employer] or Organizations: No    Attends Banker Meetings: Never    Marital Status: Widowed    Objective:  LMP  (LMP Unknown)      09/07/2022    9:29 AM 08/04/2022    9:21 AM 07/16/2022    8:31 AM  BP/Weight  Systolic BP 120 122 90  Diastolic BP 68 62 60  Wt. (Lbs) 130    BMI 23.78 kg/m2      Physical Exam  Diabetic Foot Exam - Simple   No data filed      Lab Results  Component Value Date   WBC 8.6 07/16/2022   HGB 13.6 07/16/2022   HCT 43.7 07/16/2022   PLT 190 07/16/2022   GLUCOSE 124 (H) 07/16/2022   CHOL 149 07/16/2022   TRIG 75 07/16/2022   HDL 57 07/16/2022   LDLCALC 77 07/16/2022   ALT 16 07/16/2022   AST 23 07/16/2022   NA 142 07/16/2022   K 4.9 07/16/2022   CL 103 07/16/2022   CREATININE 0.84 07/16/2022   BUN 12 07/16/2022   CO2 26 07/16/2022   TSH 2.080 03/29/2022   HGBA1C 6.9 (H) 07/16/2022   MICROALBUR 30 07/21/2020      Assessment & Plan:    There are no diagnoses linked to this encounter.   No orders of the defined types were placed in this encounter.   No orders of the defined types were placed in this encounter.    Follow-up: No follow-ups on file.   I,Jacqua L  Marsh,acting as a scribe for [employer], PA.,have documented all relevant documentation on the behalf of Langley Gauss, PA,as directed by  Langley Gauss, PA while in the presence of Langley Gauss, [place].   An After Visit Summary was printed and given to the patient.  Langley Gauss, [place] [facility] [phone]

## 2022-10-16 NOTE — Assessment & Plan Note (Addendum)
Well controlled.  Continue to work on eating a healthy diet and exercise.  Labs drawn today.  The current medical regimen is effective;  continue present plan with lisinopril 20 mg OD and metoprolol 25 mg BD  BP Readings from Last 3 Encounters:  10/20/22 110/60  09/07/22 120/68  08/04/22 122/62

## 2022-10-16 NOTE — Assessment & Plan Note (Signed)
Well controlled.  Continue ibandronate monthly.  Continue calcium with D 1200 mg daily. Continue to work on eating a healthy diet and exercise.  Labs drawn today.

## 2022-10-16 NOTE — Assessment & Plan Note (Signed)
Well controlled.  Continue to work on eating a healthy diet and exercise.  Labs drawn today.  The current medical regimen is effective;  continue present plan with Rosuvastatin 40mg  before bed and zetia 10 mg before bed.

## 2022-10-20 ENCOUNTER — Ambulatory Visit (INDEPENDENT_AMBULATORY_CARE_PROVIDER_SITE_OTHER): Payer: Medicare HMO | Admitting: Physician Assistant

## 2022-10-20 ENCOUNTER — Encounter: Payer: Self-pay | Admitting: Physician Assistant

## 2022-10-20 VITALS — BP 110/60 | HR 51 | Temp 97.5°F | Ht 62.0 in | Wt 126.4 lb

## 2022-10-20 DIAGNOSIS — E1169 Type 2 diabetes mellitus with other specified complication: Secondary | ICD-10-CM

## 2022-10-20 DIAGNOSIS — R32 Unspecified urinary incontinence: Secondary | ICD-10-CM | POA: Diagnosis not present

## 2022-10-20 DIAGNOSIS — I152 Hypertension secondary to endocrine disorders: Secondary | ICD-10-CM

## 2022-10-20 DIAGNOSIS — N3090 Cystitis, unspecified without hematuria: Secondary | ICD-10-CM | POA: Diagnosis not present

## 2022-10-20 DIAGNOSIS — E1159 Type 2 diabetes mellitus with other circulatory complications: Secondary | ICD-10-CM

## 2022-10-20 DIAGNOSIS — Z794 Long term (current) use of insulin: Secondary | ICD-10-CM | POA: Diagnosis not present

## 2022-10-20 DIAGNOSIS — R0781 Pleurodynia: Secondary | ICD-10-CM | POA: Diagnosis not present

## 2022-10-20 DIAGNOSIS — E782 Mixed hyperlipidemia: Secondary | ICD-10-CM | POA: Diagnosis not present

## 2022-10-20 DIAGNOSIS — M810 Age-related osteoporosis without current pathological fracture: Secondary | ICD-10-CM | POA: Diagnosis not present

## 2022-10-20 LAB — POCT URINALYSIS DIP (CLINITEK)
Bilirubin, UA: NEGATIVE
Blood, UA: NEGATIVE
Glucose, UA: NEGATIVE mg/dL
Ketones, POC UA: NEGATIVE mg/dL
Nitrite, UA: NEGATIVE
POC PROTEIN,UA: NEGATIVE
Spec Grav, UA: >=1.03 — AB (ref 1.010–1.025)
Urobilinogen, UA: 0.2 E.U./dL
pH, UA: 5.5 (ref 5.0–8.0)

## 2022-10-20 MED ORDER — EZETIMIBE 10 MG PO TABS
10.0000 mg | ORAL_TABLET | Freq: Every day | ORAL | 3 refills | Status: DC
Start: 2022-10-20 — End: 2022-11-17

## 2022-10-20 MED ORDER — ALENDRONATE SODIUM 70 MG PO TABS
70.0000 mg | ORAL_TABLET | ORAL | 2 refills | Status: DC
Start: 2022-10-20 — End: 2022-11-17

## 2022-10-20 MED ORDER — TRESIBA FLEXTOUCH 200 UNIT/ML ~~LOC~~ SOPN
12.0000 [IU] | PEN_INJECTOR | Freq: Every day | SUBCUTANEOUS | 0 refills | Status: DC
Start: 2022-10-20 — End: 2022-11-17

## 2022-10-20 MED ORDER — MELOXICAM 15 MG PO TABS
15.0000 mg | ORAL_TABLET | Freq: Every day | ORAL | 0 refills | Status: DC
Start: 2022-10-20 — End: 2022-11-17

## 2022-10-20 NOTE — Assessment & Plan Note (Addendum)
>>  ASSESSMENT AND PLAN FOR COMBINED HYPERLIPIDEMIA ASSOCIATED WITH TYPE 2 DIABETES MELLITUS (HCC) WRITTEN ON 10/20/2022  9:15 AM BY Amillion Macchia, PA  Controlled Denies any side effects or issues with current medications Continue taking Zetia 10mg , Rosuvastatin40mg  as directed Labs drawn today Will adjust treatment as needed depending on labs Lab Results  Component Value Date   LDLCALC 77 07/16/2022      >>ASSESSMENT AND PLAN FOR MIXED HYPERLIPIDEMIA WRITTEN ON 10/16/2022  6:28 PM BY MARSH, JACQUA L, CMA  Well controlled.  Continue to work on eating a healthy diet and exercise.  Labs drawn today.  The current medical regimen is effective;  continue present plan with Rosuvastatin 40mg  before bed and zetia 10 mg before bed.

## 2022-10-20 NOTE — Assessment & Plan Note (Signed)
Urine sample taken today to rule out UTI and proteins in urine Will adjust treatment as needed

## 2022-11-08 ENCOUNTER — Telehealth: Payer: Self-pay

## 2022-11-08 NOTE — Telephone Encounter (Signed)
[facility]: INCONTINENCE ORDER: PRODUCTS ALDULT LARGE PULL-ON/DISPOSABLE UNDERWEAR, DISPOSABLE UNDERPADS/CHUX, NON-STERILE GOLVES (PER BOX), PERSONAL CLEANSING WASHCLOTH, DISPOSABLE

## 2022-11-16 ENCOUNTER — Ambulatory Visit: Payer: Medicare HMO

## 2022-11-17 ENCOUNTER — Other Ambulatory Visit: Payer: Self-pay

## 2022-11-17 DIAGNOSIS — E559 Vitamin D deficiency, unspecified: Secondary | ICD-10-CM

## 2022-11-17 DIAGNOSIS — J302 Other seasonal allergic rhinitis: Secondary | ICD-10-CM

## 2022-11-17 DIAGNOSIS — E1159 Type 2 diabetes mellitus with other circulatory complications: Secondary | ICD-10-CM

## 2022-11-17 DIAGNOSIS — R0781 Pleurodynia: Secondary | ICD-10-CM

## 2022-11-17 DIAGNOSIS — R1084 Generalized abdominal pain: Secondary | ICD-10-CM

## 2022-11-17 DIAGNOSIS — N952 Postmenopausal atrophic vaginitis: Secondary | ICD-10-CM

## 2022-11-17 DIAGNOSIS — L739 Follicular disorder, unspecified: Secondary | ICD-10-CM

## 2022-11-17 DIAGNOSIS — M810 Age-related osteoporosis without current pathological fracture: Secondary | ICD-10-CM

## 2022-11-17 DIAGNOSIS — E1169 Type 2 diabetes mellitus with other specified complication: Secondary | ICD-10-CM

## 2022-11-17 DIAGNOSIS — E782 Mixed hyperlipidemia: Secondary | ICD-10-CM

## 2022-11-17 MED ORDER — ACCU-CHEK GUIDE W/DEVICE KIT
1.0000 | PACK | Freq: Every day | 0 refills | Status: AC
Start: 2022-11-17 — End: ?

## 2022-11-17 MED ORDER — MUPIROCIN 2 % EX OINT: 1.0000 | TOPICAL_OINTMENT | Freq: Two times a day (BID) | CUTANEOUS | 2 refills | Status: DC

## 2022-11-17 MED ORDER — GABAPENTIN 300 MG PO CAPS
300.0000 mg | ORAL_CAPSULE | Freq: Three times a day (TID) | ORAL | 2 refills | Status: DC
Start: 2022-11-17 — End: 2023-05-05

## 2022-11-17 MED ORDER — METOPROLOL TARTRATE 25 MG PO TABS
25.0000 mg | ORAL_TABLET | Freq: Two times a day (BID) | ORAL | 2 refills | Status: DC
Start: 2022-11-17 — End: 2023-05-05

## 2022-11-17 MED ORDER — TRESIBA FLEXTOUCH 200 UNIT/ML ~~LOC~~ SOPN
12.0000 [IU] | PEN_INJECTOR | Freq: Every day | SUBCUTANEOUS | 0 refills | Status: AC
Start: 2022-11-17 — End: 2023-02-15

## 2022-11-17 MED ORDER — ROSUVASTATIN CALCIUM 40 MG PO TABS: 40.0000 mg | ORAL_TABLET | Freq: Every day | ORAL | 2 refills | Status: DC

## 2022-11-17 MED ORDER — ONETOUCH ULTRASOFT LANCETS MISC
1.0000 | Freq: Every day | 2 refills | Status: DC
Start: 2022-11-17 — End: 2023-02-02

## 2022-11-17 MED ORDER — ESTRADIOL 0.1 MG/GM VA CREA
TOPICAL_CREAM | VAGINAL | 3 refills | Status: DC
Start: 2022-11-17 — End: 2022-12-20

## 2022-11-17 MED ORDER — LIRAGLUTIDE 18 MG/3ML ~~LOC~~ SOPN: 1.2000 mg | PEN_INJECTOR | Freq: Every day | SUBCUTANEOUS | 1 refills | Status: DC

## 2022-11-17 MED ORDER — BD PEN NEEDLE NANO U/F 32G X 4 MM MISC: 1 refills | Status: DC

## 2022-11-17 MED ORDER — PANTOPRAZOLE SODIUM 40 MG PO TBEC
40.0000 mg | DELAYED_RELEASE_TABLET | Freq: Every day | ORAL | 2 refills | Status: DC
Start: 2022-11-17 — End: 2023-02-02

## 2022-11-17 MED ORDER — LISINOPRIL 20 MG PO TABS: 20.0000 mg | ORAL_TABLET | Freq: Every day | ORAL | 1 refills | Status: DC

## 2022-11-17 MED ORDER — ALENDRONATE SODIUM 70 MG PO TABS
70.0000 mg | ORAL_TABLET | ORAL | 2 refills | Status: DC
Start: 2022-11-17 — End: 2022-12-20

## 2022-11-17 MED ORDER — MELOXICAM 15 MG PO TABS
15.0000 mg | ORAL_TABLET | Freq: Every day | ORAL | 0 refills | Status: DC
Start: 2022-11-17 — End: 2023-07-21

## 2022-11-17 MED ORDER — EZETIMIBE 10 MG PO TABS
10.0000 mg | ORAL_TABLET | Freq: Every day | ORAL | 3 refills | Status: DC
Start: 2022-11-17 — End: 2023-05-05

## 2022-11-17 MED ORDER — VITAMIN D (ERGOCALCIFEROL) 1.25 MG (50000 UNIT) PO CAPS
50000.0000 [IU] | ORAL_CAPSULE | ORAL | 6 refills | Status: DC
Start: 2022-11-17 — End: 2023-05-05

## 2022-11-17 MED ORDER — MIRTAZAPINE 15 MG PO TABS: 15.0000 mg | ORAL_TABLET | Freq: Every day | ORAL | 1 refills | Status: DC

## 2022-11-17 MED ORDER — ACCU-CHEK GUIDE VI STRP
1.0000 | ORAL_STRIP | Freq: Every day | 2 refills | Status: DC
Start: 2022-11-17 — End: 2023-02-02

## 2022-11-17 MED ORDER — FLUTICASONE PROPIONATE 50 MCG/ACT NA SUSP
2.0000 | Freq: Every day | NASAL | 6 refills | Status: DC
Start: 2022-11-17 — End: 2023-05-05

## 2022-12-13 ENCOUNTER — Ambulatory Visit: Payer: Medicare HMO | Admitting: Physician Assistant

## 2022-12-13 ENCOUNTER — Encounter: Payer: Self-pay | Admitting: Physician Assistant

## 2022-12-13 VITALS — BP 100/60 | HR 70 | Temp 97.2°F | Ht 62.0 in | Wt 124.4 lb

## 2022-12-13 DIAGNOSIS — N3090 Cystitis, unspecified without hematuria: Secondary | ICD-10-CM

## 2022-12-13 LAB — POCT URINALYSIS DIP (CLINITEK)
Blood, UA: NEGATIVE
Glucose, UA: NEGATIVE mg/dL
Ketones, POC UA: NEGATIVE mg/dL
Nitrite, UA: NEGATIVE
Spec Grav, UA: >=1.03 — AB (ref 1.010–1.025)
Urobilinogen, UA: 0.2 U/dL
pH, UA: 5.5 (ref 5.0–8.0)

## 2022-12-13 MED ORDER — CIPROFLOXACIN HCL 500 MG PO TABS: 500.0000 mg | ORAL_TABLET | Freq: Two times a day (BID) | ORAL | 0 refills | Status: DC

## 2022-12-13 NOTE — Assessment & Plan Note (Signed)
Prescribed Ciprofloxacin 500mg  Will continue to monitor symptoms Will retest in a few weeks if she continues to be symptomatic

## 2022-12-13 NOTE — Progress Notes (Signed)
Acute Office Visit  Subjective:    Patient ID: Rebecca Hodge, female    DOB: 1940-08-09, 82 y.o.   MRN: [id number]  Chief Complaint  Patient presents with   Urinary Frequency   Abdominal Pain    Lower    HPI: Patient present today in office with urinary symptoms. 2-3 weeks.  Urinary symptoms  She reports new onset flank pain, urinary frequency, and urinary urgency. The current episode started a few weeks ago and is staying constant. Patient states symptoms are mild in intensity, occurring constantly. She  has been recently treated for similar symptoms. Denies any history of kidney stones or blood in her urine.      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  Social History   Socioeconomic History   Marital status: Widowed    Spouse name: Not on file   Number of children: Not on file   Years of education: Not on file   Highest education level: 8th grade  Occupational History   Occupation: Disabled  Tobacco Use   Smoking status: Former    Current packs/day: 0.00    Average packs/day: 1 pack/day for 5.0 years (5.0 ttl pk-yrs)    Types: Cigarettes    Start date: 03/15/1982    Quit date: 03/16/1987    Years since quitting: 35.7   Smokeless tobacco: Never  Vaping Use   Vaping status: Never Used  Substance and Sexual Activity   Alcohol use: No   Drug use: No   Sexual  activity: Not Currently  Other Topics Concern   Not on file  Social History Narrative   Not on file   Social Determinants of Health   Financial Resource Strain: Low Risk  (09/07/2022)   Overall Financial Resource Strain (CARDIA)    Difficulty of Paying Living Expenses: Not hard at all  Food Insecurity: No Food Insecurity (09/07/2022)   Hunger Vital Sign    Worried About Running Out of Food in the Last Year: Never true    Ran Out of Food in the Last Year: Never true  Transportation Needs: No Transportation Needs (09/07/2022)   PRAPARE - Administrator, Civil Service (Medical): No    Lack of Transportation (Non-Medical): No  Physical Activity: Sufficiently Active (09/07/2022)   Exercise Vital Sign    Days of Exercise per Week: 5 days    Minutes of Exercise per Session: 30 min  Stress: No Stress Concern Present (09/07/2022)   [employer] of Occupational Health - Occupational Stress Questionnaire    Feeling of Stress : Not at all  Social Connections: Moderately Isolated (09/07/2022)   Social Connection and Isolation Panel [NHANES]    Frequency of Communication with Friends and Family: Three times a week    Frequency of Social Gatherings with Friends and Family: Twice a week    Attends Religious Services: More  than 4 times per year    Active Member of Clubs or Organizations: No    Attends Banker Meetings: Never    Marital Status: Widowed  Intimate Partner Violence: Not At Risk (09/07/2022)   Humiliation, Afraid, Rape, and Kick questionnaire    Fear of Current or Ex-Partner: No    Emotionally Abused: No    Physically Abused: No    Sexually Abused: No    Outpatient Medications Prior to Visit  Medication Sig Dispense Refill   ACCU-CHEK GUIDE test strip 1 each by Other route daily. Check blood sugar fasting in the morning daily. 100 each 2   alendronate (FOSAMAX) 70 MG tablet Take 1 tablet (70 mg total) by mouth once a week. 6 tablet 2   BD PEN NEEDLE NANO  U/F 32G X 4 MM MISC Use new needle with each injection 50 each 1   Blood Glucose Monitoring Suppl (ACCU-CHEK GUIDE) w/Device KIT 1 each by Other route daily. 1 kit 0   estradiol (ESTRACE) 0.1 MG/GM vaginal cream INSERT 1 APPLICATORFUL VAGINALLY 3 TIMES A WEEK 42.5 g 3   ezetimibe (ZETIA) 10 MG tablet Take 1 tablet (10 mg total) by mouth daily. 90 tablet 3   fluticasone (FLONASE) 50 MCG/ACT nasal spray Place 2 sprays into both nostrils daily. 16 g 6   gabapentin (NEURONTIN) 300 MG capsule Take 1 capsule (300 mg total) by mouth 3 (three) times daily. 270 capsule 2   insulin degludec (TRESIBA FLEXTOUCH) 200 UNIT/ML FlexTouch Pen Inject 12 Units into the skin at bedtime. 6 mL 0   Lancets (ONETOUCH ULTRASOFT) lancets 1 each by Other route daily. Use as instructed 100 each 2   liraglutide (VICTOZA) 18 MG/3ML SOPN Inject 1.2 mg into the skin daily. 9 mL 1   lisinopril (ZESTRIL) 20 MG tablet Take 1 tablet (20 mg total) by mouth daily. 90 tablet 1   meloxicam (MOBIC) 15 MG tablet Take 1 tablet (15 mg total) by mouth daily. 30 tablet 0   metoprolol tartrate (LOPRESSOR) 25 MG tablet Take 1 tablet (25 mg total) by mouth 2 (two) times daily. 180 tablet 2   mirtazapine (REMERON) 15 MG tablet Take 1 tablet (15 mg total) by mouth at bedtime. 90 tablet 1   mupirocin ointment (BACTROBAN) 2 % Apply 1 Application topically 2 (two) times daily. 22 g 2   pantoprazole (PROTONIX) 40 MG tablet Take 1 tablet (40 mg total) by mouth daily. 90 tablet 2   rosuvastatin (CRESTOR) 40 MG tablet Take 1 tablet (40 mg total) by mouth at bedtime. 90 tablet 2   Vitamin D, Ergocalciferol, (DRISDOL) 1.25 MG (50000 UNIT) CAPS capsule Take 1 capsule (50,000 Units total) by mouth every 7 (seven) days. 12 capsule 6   No facility-administered medications prior to visit.    Allergies  Allergen Reactions   Codeine    Morphine And Codeine    Lantus [Insulin Glargine] Itching   Sulfa Antibiotics Itching    Review of Systems   Constitutional:  Negative for fatigue.  HENT:  Negative for congestion, ear pain and sore throat.   Respiratory:  Negative for cough and shortness of breath.   Cardiovascular:  Negative for chest pain.  Gastrointestinal:  Positive for abdominal pain (Lower). Negative for constipation, diarrhea, nausea and vomiting.  Genitourinary:  Positive for flank pain, frequency and urgency. Negative for dysuria.  Musculoskeletal:  Negative for arthralgias, back pain and myalgias.  Neurological:  Negative for dizziness and headaches.  Psychiatric/Behavioral:  Negative for  agitation and sleep disturbance. The patient is not nervous/anxious.        Objective:        12/13/2022    2:44 PM 10/20/2022    8:18 AM 09/07/2022    9:29 AM  Vitals with BMI  Height 5\' 2"  5\' 2"  5\' 2"   Weight 124 lbs 6 oz 126 lbs 6 oz 130 lbs  BMI 22.75 23.11 23.77  Systolic 100 110 161  Diastolic 60 60 68  Pulse 70 51 56    Orthostatic VS for the past 72 hrs (Last 3 readings):  Patient Position BP Location Cuff Size  12/13/22 1444 Sitting Left Arm Normal     Physical Exam Vitals reviewed.  Constitutional:      Appearance: Normal appearance.  Neck:     Vascular: No carotid bruit.  Cardiovascular:     Rate and Rhythm: Normal rate and regular rhythm.     Heart sounds: Normal heart sounds.  Pulmonary:     Effort: Pulmonary effort is normal.     Breath sounds: Normal breath sounds.  Abdominal:     General: Bowel sounds are normal.     Palpations: Abdomen is soft.     Tenderness: There is abdominal tenderness in the suprapubic area.  Neurological:     Mental Status: She is alert and oriented to person, place, and time.  Psychiatric:        Mood and Affect: Mood normal.        Behavior: Behavior normal.     Health Maintenance Due  Topic Date Due   DTaP/Tdap/Td (1 - Tdap) Never done   OPHTHALMOLOGY EXAM  03/19/2021   INFLUENZA VACCINE  10/14/2022    There are no preventive care reminders to display for  this patient.   Lab Results  Component Value Date   TSH 2.080 03/29/2022   Lab Results  Component Value Date   WBC 5.4 10/20/2022   HGB 13.8 10/20/2022   HCT 44.5 10/20/2022   MCV 89 10/20/2022   PLT 191 10/20/2022   Lab Results  Component Value Date   NA 145 (H) 10/20/2022   K 4.4 10/20/2022   CO2 24 10/20/2022   GLUCOSE 63 (L) 10/20/2022   BUN 9 10/20/2022   CREATININE 0.87 10/20/2022   BILITOT 0.5 10/20/2022   ALKPHOS 60 10/20/2022   AST 26 10/20/2022   ALT 20 10/20/2022   PROT 6.4 10/20/2022   ALBUMIN 4.3 10/20/2022   CALCIUM 9.7 10/20/2022   EGFR 66 10/20/2022   Lab Results  Component Value Date   CHOL 179 10/20/2022   Lab Results  Component Value Date   HDL 67 10/20/2022   Lab Results  Component Value Date   LDLCALC 93 10/20/2022   Lab Results  Component Value Date   TRIG 109 10/20/2022   Lab Results  Component Value Date   CHOLHDL 2.7 10/20/2022   Lab Results  Component Value Date   HGBA1C 7.1 (H) 10/20/2022       Assessment & Plan:  Cystitis Assessment & Plan: Prescribed Ciprofloxacin 500mg  Will continue to monitor symptoms Will retest in a few weeks if she continues to be symptomatic  Orders: -     POCT URINALYSIS DIP (CLINITEK) -     Urine Culture -     Ciprofloxacin HCl; Take 1 tablet (500 mg total) by mouth 2 (two) times daily for 10 days.  Dispense: 20 tablet; Refill: 0     Meds ordered this encounter  Medications  ciprofloxacin (CIPRO) 500 MG tablet    Sig: Take 1 tablet (500 mg total) by mouth 2 (two) times daily for 10 days.    Dispense:  20 tablet    Refill:  0    Orders Placed This Encounter  Procedures   Urine Culture   POCT URINALYSIS DIP (CLINITEK)     Follow-up: No follow-ups on file.  An After Visit Summary was printed and given to the patient.  Langley Gauss, [place] [facility] [phone]

## 2022-12-14 DIAGNOSIS — E119 Type 2 diabetes mellitus without complications: Secondary | ICD-10-CM | POA: Diagnosis not present

## 2022-12-14 DIAGNOSIS — R32 Unspecified urinary incontinence: Secondary | ICD-10-CM | POA: Diagnosis not present

## 2022-12-15 ENCOUNTER — Other Ambulatory Visit: Payer: Self-pay | Admitting: Physician Assistant

## 2022-12-15 ENCOUNTER — Other Ambulatory Visit: Payer: Self-pay

## 2022-12-15 DIAGNOSIS — E1169 Type 2 diabetes mellitus with other specified complication: Secondary | ICD-10-CM

## 2022-12-15 LAB — URINE CULTURE

## 2022-12-15 MED ORDER — TRESIBA FLEXTOUCH 200 UNIT/ML ~~LOC~~ SOPN: 12.0000 [IU] | PEN_INJECTOR | Freq: Every day | SUBCUTANEOUS | 0 refills | Status: DC

## 2022-12-15 MED ORDER — CIPROFLOXACIN HCL 250 MG PO TABS: 250.0000 mg | ORAL_TABLET | Freq: Two times a day (BID) | ORAL | 0 refills | Status: AC

## 2022-12-20 ENCOUNTER — Encounter: Payer: Self-pay | Admitting: Physician Assistant

## 2022-12-20 ENCOUNTER — Ambulatory Visit: Payer: Medicare HMO | Admitting: Physician Assistant

## 2022-12-20 VITALS — BP 100/64 | HR 56 | Temp 97.2°F | Resp 12 | Ht 62.0 in | Wt 124.0 lb

## 2022-12-20 DIAGNOSIS — N390 Urinary tract infection, site not specified: Secondary | ICD-10-CM | POA: Diagnosis not present

## 2022-12-20 DIAGNOSIS — R1030 Lower abdominal pain, unspecified: Secondary | ICD-10-CM | POA: Diagnosis not present

## 2022-12-20 DIAGNOSIS — J302 Other seasonal allergic rhinitis: Secondary | ICD-10-CM | POA: Diagnosis not present

## 2022-12-20 DIAGNOSIS — M810 Age-related osteoporosis without current pathological fracture: Secondary | ICD-10-CM | POA: Diagnosis not present

## 2022-12-20 DIAGNOSIS — N952 Postmenopausal atrophic vaginitis: Secondary | ICD-10-CM

## 2022-12-20 DIAGNOSIS — R1031 Right lower quadrant pain: Secondary | ICD-10-CM | POA: Insufficient documentation

## 2022-12-20 LAB — POCT URINALYSIS DIP (CLINITEK)
Bilirubin, UA: NEGATIVE
Blood, UA: NEGATIVE
Glucose, UA: NEGATIVE mg/dL
Ketones, POC UA: NEGATIVE mg/dL
Leukocytes, UA: NEGATIVE
Nitrite, UA: NEGATIVE
POC PROTEIN,UA: NEGATIVE
Spec Grav, UA: 1.025 (ref 1.010–1.025)
Urobilinogen, UA: 0.2 U/dL
pH, UA: 5.5 (ref 5.0–8.0)

## 2022-12-20 MED ORDER — TRIAMCINOLONE ACETONIDE 40 MG/ML IJ SUSP
40.0000 mg | Freq: Once | INTRAMUSCULAR | Status: AC
Start: 2022-12-20 — End: 2022-12-20
  Administered 2022-12-20: 40 mg via INTRA_ARTICULAR

## 2022-12-20 MED ORDER — ALENDRONATE SODIUM 70 MG PO TABS
70.0000 mg | ORAL_TABLET | ORAL | 2 refills | Status: DC
Start: 2022-12-20 — End: 2023-05-05

## 2022-12-20 MED ORDER — NITROFURANTOIN MACROCRYSTAL 50 MG PO CAPS
ORAL_CAPSULE | ORAL | 2 refills | Status: DC
Start: 2022-12-20 — End: 2022-12-30

## 2022-12-20 MED ORDER — ESTRADIOL 0.1 MG/GM VA CREA
TOPICAL_CREAM | VAGINAL | 3 refills | Status: DC
Start: 2022-12-20 — End: 2023-05-05

## 2022-12-20 NOTE — Assessment & Plan Note (Signed)
Controlled on Fosamax. Continue taking as directed Denies any major side effects or issues

## 2022-12-20 NOTE — Assessment & Plan Note (Signed)
No new major complaints Continue to monitor symptoms Refilled estrace

## 2022-12-20 NOTE — Assessment & Plan Note (Signed)
Restarted Prescription for Nitrofurantoin 50mg .  Will continue to monitor symptoms Will adjust treatment as needed

## 2022-12-20 NOTE — Assessment & Plan Note (Signed)
Urinalysis came back normal Will perform U/S if injection and daily medicine are not helping

## 2022-12-20 NOTE — Progress Notes (Signed)
Subjective:  Patient ID: Rebecca Hodge, female    DOB: Jul 17, 1940  Age: 82 y.o. MRN: [id number]  Chief Complaint  Patient presents with   Abdominal Pain    HPI   Patient is here for lower abdominal burning pain. She was seen on 12/13/22 and she took antibiotics, but she is still feeling the pain with radiation to the back. States that she would sometimes get an antibiotic injection that would help treat it along with her sinus congestions. Could not recall what the antibiotic was, but stated it helped not only her sinuses but also the burning she was feeling. Informed her that the urine we took today did not show any infection and that the medicine we went worked. She stated she used to be on a daily medication to help with her urinary symptoms. Has not been on that for awhile now due to it not being filled. We will look into restarting that for her today.   She also said that she has sinus pain and rhinorrhea. Denies any fevers, cough, or ear pain. Denies any history of allergies.      10/20/2022    8:22 AM 09/07/2022    9:23 AM 07/08/2022    8:51 AM 11/03/2021    2:24 PM 06/22/2021    4:22 PM  Depression screen PHQ 2/9  Decreased Interest 0 0 0 0 0  Down, Depressed, Hopeless 0 0 0 0 0  PHQ - 2 Score 0 0 0 0 0  Altered sleeping 0  0 0   Tired, decreased energy 0  0 0   Change in appetite 0  0 0   Feeling bad or failure about yourself  0  0 0   Trouble concentrating 0  0 0   Moving slowly or fidgety/restless 0  0 0   Suicidal thoughts 0  0 0   PHQ-9 Score 0  0 0   Difficult doing work/chores Not difficult at all  Not difficult at all          10/20/2022    8:22 AM  Fall Risk   Falls in the past year? 0  Number falls in past yr: 0  Injury with Fall? 0  Risk for fall due to : No Fall Risks  Follow up Falls evaluation completed    Patient Care Team: Langley Gauss, [place] as PCP - General (Physician Assistant) Zettie Pho, [facility] (Inactive) as Pharmacist (Pharmacist)   Review  of Systems  Constitutional:  Negative for chills, fatigue and fever.  HENT:  Positive for rhinorrhea and sinus pain. Negative for congestion, ear pain and sore throat.   Respiratory:  Negative for cough and shortness of breath.   Cardiovascular:  Negative for chest pain and palpitations.  Gastrointestinal:  Negative for abdominal pain, constipation, diarrhea, nausea and vomiting.  Endocrine: Negative for polydipsia, polyphagia and polyuria.  Genitourinary:  Negative for difficulty urinating and dysuria.  Musculoskeletal:  Negative for arthralgias, back pain and myalgias.  Skin:  Negative for rash.  Neurological:  Negative for headaches.  Psychiatric/Behavioral:  Negative for dysphoric mood. The patient is not nervous/anxious.     Current Outpatient Medications on File Prior to Visit  Medication Sig Dispense Refill   ACCU-CHEK GUIDE test strip 1 each by Other route daily. Check blood sugar fasting in the morning daily. 100 each 2   BD PEN NEEDLE NANO U/F 32G X 4 MM MISC Use new needle with each injection 50 each 1   Blood  Glucose Monitoring Suppl (ACCU-CHEK GUIDE) w/Device KIT 1 each by Other route daily. 1 kit 0   ezetimibe (ZETIA) 10 MG tablet Take 1 tablet (10 mg total) by mouth daily. 90 tablet 3   fluticasone (FLONASE) 50 MCG/ACT nasal spray Place 2 sprays into both nostrils daily. 16 g 6   gabapentin (NEURONTIN) 300 MG capsule Take 1 capsule (300 mg total) by mouth 3 (three) times daily. 270 capsule 2   insulin degludec (TRESIBA FLEXTOUCH) 200 UNIT/ML FlexTouch Pen Inject 12 Units into the skin at bedtime. 6 mL 0   Lancets (ONETOUCH ULTRASOFT) lancets 1 each by Other route daily. Use as instructed 100 each 2   lisinopril (ZESTRIL) 20 MG tablet Take 1 tablet (20 mg total) by mouth daily. 90 tablet 1   meloxicam (MOBIC) 15 MG tablet Take 1 tablet (15 mg total) by mouth daily. 30 tablet 0   metoprolol tartrate (LOPRESSOR) 25 MG tablet Take 1 tablet (25 mg total) by mouth 2 (two) times  daily. 180 tablet 2   mirtazapine (REMERON) 15 MG tablet Take 1 tablet (15 mg total) by mouth at bedtime. 90 tablet 1   mupirocin ointment (BACTROBAN) 2 % Apply 1 Application topically 2 (two) times daily. 22 g 2   pantoprazole (PROTONIX) 40 MG tablet Take 1 tablet (40 mg total) by mouth daily. 90 tablet 2   rosuvastatin (CRESTOR) 40 MG tablet Take 1 tablet (40 mg total) by mouth at bedtime. 90 tablet 2   Vitamin D, Ergocalciferol, (DRISDOL) 1.25 MG (50000 UNIT) CAPS capsule Take 1 capsule (50,000 Units total) by mouth every 7 (seven) days. 12 capsule 6   No current facility-administered medications on file prior to visit.   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 No pertinent family history. Social History   Socioeconomic History   Marital status: Widowed    Spouse name: Not on file   Number of children: Not on file   Years of education: Not on file   Highest education level: 8th grade  Occupational History   Occupation: Disabled  Tobacco Use   Smoking status: Former    Current packs/day: 0.00    Average packs/day: 1 pack/day for 5.0 years (5.0 ttl pk-yrs)    Types: Cigarettes    Start date: 03/15/1982    Quit date: 03/16/1987    Years since quitting: 35.7   Smokeless tobacco: Never  Vaping Use   Vaping status: Never Used  Substance and Sexual Activity   Alcohol use: No   Drug use: No    Sexual activity: Not Currently  Other Topics Concern   Not on file  Social History Narrative   Not on file   Social Determinants of Health   Financial Resource Strain: Low Risk  (09/07/2022)   Overall Financial Resource Strain (CARDIA)    Difficulty of Paying Living Expenses: Not hard at all  Food Insecurity: No  Food Insecurity (09/07/2022)   Hunger Vital Sign    Worried About Running Out of Food in the Last Year: Never true    Ran Out of Food in the Last Year: Never true  Transportation Needs: No Transportation Needs (09/07/2022)   PRAPARE - Administrator, Civil Service (Medical): No    Lack of Transportation (Non-Medical): No  Physical Activity: Sufficiently Active (09/07/2022)   Exercise Vital Sign    Days of Exercise per Week: 5 days    Minutes of Exercise per Session: 30 min  Stress: No Stress Concern Present (09/07/2022)   [employer] of Occupational Health - Occupational Stress Questionnaire    Feeling of Stress : Not at all  Social Connections: Moderately Isolated (09/07/2022)   Social Connection and Isolation Panel [NHANES]    Frequency of Communication with Friends and Family: Three times a week    Frequency of Social Gatherings with Friends and Family: Twice a week    Attends Religious Services: More than 4 times per year    Active Member of [employer] or Organizations: No    Attends Banker Meetings: Never    Marital Status: Widowed    Objective:  BP 100/64   Pulse (!) 56   Temp (!) 97.2 F (36.2 C)   Resp 12   Ht 5\' 2"  (1.575 m)   Wt 124 lb (56.2 kg)   LMP  (LMP Unknown)   SpO2 98%   BMI 22.68 kg/m      12/20/2022   10:02 AM 12/13/2022    2:44 PM 10/20/2022    8:18 AM  BP/Weight  Systolic BP 100 100 110  Diastolic BP 64 60 60  Wt. (Lbs) 124 124.4 126.4  BMI 22.68 kg/m2 22.75 kg/m2 23.12 kg/m2    Physical Exam Vitals reviewed.  Constitutional:      Appearance: Normal appearance.  HENT:     Right Ear: Hearing, tympanic  membrane and ear canal normal.     Left Ear: Hearing, tympanic membrane and ear canal normal.     Nose: Rhinorrhea present. Rhinorrhea is clear.     Right Sinus: Maxillary sinus tenderness present.     Left Sinus: Maxillary sinus tenderness present.     Mouth/Throat:     Mouth: Mucous membranes are moist. No injury.     Tongue: No lesions.     Palate: No mass.     Pharynx: Oropharynx is clear. No pharyngeal swelling or posterior oropharyngeal erythema.  Neck:     Vascular: No carotid bruit.  Cardiovascular:     Rate and Rhythm: Normal rate and regular rhythm.     Heart sounds: Normal heart sounds.  Pulmonary:     Effort: Pulmonary effort is normal.     Breath sounds: Normal breath sounds.  Abdominal:     General: Bowel sounds are normal.     Palpations: Abdomen is soft.     Tenderness: There is no abdominal tenderness.  Neurological:     Mental Status: She is alert and oriented to person, place, and time.  Psychiatric:        Mood and Affect: Mood normal.        Behavior: Behavior normal.     Diabetic Foot Exam - Simple   No data filed      Lab Results  Component Value Date   WBC 5.4 10/20/2022   HGB 13.8 10/20/2022   HCT 44.5 10/20/2022   PLT 191 10/20/2022   GLUCOSE 63 (L)  10/20/2022   CHOL 179 10/20/2022   TRIG 109 10/20/2022   HDL 67 10/20/2022   LDLCALC 93 10/20/2022   ALT 20 10/20/2022   AST 26 10/20/2022   NA 145 (H) 10/20/2022   K 4.4 10/20/2022   CL 104 10/20/2022   CREATININE 0.87 10/20/2022   BUN 9 10/20/2022   CO2 24 10/20/2022   TSH 2.080 03/29/2022   HGBA1C 7.1 (H) 10/20/2022   MICROALBUR 30 07/21/2020      Assessment & Plan:    Lower abdominal pain Assessment & Plan: Urinalysis came back normal Will perform U/S if injection and daily medicine are not helping  Orders: -     POCT URINALYSIS DIP (CLINITEK)  Postmenopausal atrophic vaginitis Assessment & Plan: No new major complaints Continue to monitor symptoms Refilled  estrace  Orders: -     Estradiol; INSERT 1 APPLICATORFUL VAGINALLY 3 TIMES A WEEK  Dispense: 42.5 g; Refill: 3  Age-related osteoporosis without current pathological fracture Assessment & Plan: Controlled on Fosamax. Continue taking as directed Denies any major side effects or issues  Orders: -     Alendronate Sodium; Take 1 tablet (70 mg total) by mouth once a week.  Dispense: 6 tablet; Refill: 2  Urinary tract infection without hematuria, site unspecified  Recurrent UTI Assessment & Plan: Restarted Prescription for Nitrofurantoin 50mg .  Will continue to monitor symptoms Will adjust treatment as needed  Orders: -     Nitrofurantoin Macrocrystal; TAKE 1 CAPSULE(50 MG) BY MOUTH AT BEDTIME  Dispense: 90 capsule; Refill: 2  Seasonal allergic rhinitis, unspecified trigger Assessment & Plan: Continue to monitor symptoms Will adjust treatment as needed  Orders: -     Triamcinolone Acetonide     Meds ordered this encounter  Medications   estradiol (ESTRACE) 0.1 MG/GM vaginal cream    Sig: INSERT 1 APPLICATORFUL VAGINALLY 3 TIMES A WEEK    Dispense:  42.5 g    Refill:  3   alendronate (FOSAMAX) 70 MG tablet    Sig: Take 1 tablet (70 mg total) by mouth once a week.    Dispense:  6 tablet    Refill:  2   nitrofurantoin (MACRODANTIN) 50 MG capsule    Sig: TAKE 1 CAPSULE(50 MG) BY MOUTH AT BEDTIME    Dispense:  90 capsule    Refill:  2   triamcinolone acetonide (KENALOG-40) injection 40 mg    Orders Placed This Encounter  Procedures   POCT URINALYSIS DIP (CLINITEK)     Follow-up: No follow-ups on file.   I,Marla I Leal-Borjas,acting as a scribe for [employer], PA.,have documented all relevant documentation on the behalf of Langley Gauss, PA,as directed by  Langley Gauss, PA while in the presence of Langley Gauss, [place].   An After Visit Summary was printed and given to the patient.  Langley Gauss, [place] [facility] [phone]

## 2022-12-20 NOTE — Assessment & Plan Note (Signed)
Continue to monitor symptoms Will adjust treatment as needed

## 2022-12-30 ENCOUNTER — Encounter: Payer: Self-pay | Admitting: Physician Assistant

## 2022-12-30 ENCOUNTER — Ambulatory Visit (INDEPENDENT_AMBULATORY_CARE_PROVIDER_SITE_OTHER): Payer: Medicare HMO | Admitting: Physician Assistant

## 2022-12-30 VITALS — BP 130/58 | HR 60 | Temp 97.4°F | Resp 12 | Ht 62.0 in | Wt 123.0 lb

## 2022-12-30 DIAGNOSIS — R1084 Generalized abdominal pain: Secondary | ICD-10-CM | POA: Diagnosis not present

## 2022-12-30 DIAGNOSIS — R1031 Right lower quadrant pain: Secondary | ICD-10-CM | POA: Diagnosis not present

## 2022-12-30 LAB — POCT URINALYSIS DIP (CLINITEK)
Bilirubin, UA: NEGATIVE
Blood, UA: NEGATIVE
Glucose, UA: NEGATIVE mg/dL
Ketones, POC UA: NEGATIVE mg/dL
Nitrite, UA: NEGATIVE
POC PROTEIN,UA: 30 — AB
Spec Grav, UA: >=1.03 — AB (ref 1.010–1.025)
Urobilinogen, UA: 0.2 U/dL
pH, UA: 5.5 (ref 5.0–8.0)

## 2022-12-30 NOTE — Assessment & Plan Note (Addendum)
Will continue to monitor pain Sent for CT to rule out possible stone.  Will continue to take tylenol as needed for pain

## 2022-12-30 NOTE — Progress Notes (Signed)
Subjective:  Patient ID: Rebecca Hodge, female    DOB: 08/26/1940  Age: 82 y.o. MRN: [id number]  Chief Complaint  Patient presents with   Medical Management of Chronic Issues    HPI   Patient mentioned that she is still having sharp abdominal pain. She said that is intermittent and it radiates to the back. The pain is not worse or better with food. Its worse when she lays down. States that she has noticed that her urine may sometimes be darker in color. States that it has not been today but was at the first of the week. Denies any fever, nausea, or vomiting.      10/20/2022    8:22 AM 09/07/2022    9:23 AM 07/08/2022    8:51 AM 11/03/2021    2:24 PM 06/22/2021    4:22 PM  Depression screen PHQ 2/9  Decreased Interest 0 0 0 0 0  Down, Depressed, Hopeless 0 0 0 0 0  PHQ - 2 Score 0 0 0 0 0  Altered sleeping 0  0 0   Tired, decreased energy 0  0 0   Change in appetite 0  0 0   Feeling bad or failure about yourself  0  0 0   Trouble concentrating 0  0 0   Moving slowly or fidgety/restless 0  0 0   Suicidal thoughts 0  0 0   PHQ-9 Score 0  0 0   Difficult doing work/chores Not difficult at all  Not difficult at all          10/20/2022    8:22 AM  Fall Risk   Falls in the past year? 0  Number falls in past yr: 0  Injury with Fall? 0  Risk for fall due to : No Fall Risks  Follow up Falls evaluation completed    Patient Care Team: Langley Gauss, [place] as PCP - General (Physician Assistant) Zettie Pho, [facility] (Inactive) as Pharmacist (Pharmacist)   Review of Systems  Constitutional:  Negative for chills, fatigue and fever.  HENT:  Negative for congestion, ear pain and sore throat.   Respiratory:  Negative for cough and shortness of breath.   Cardiovascular:  Negative for chest pain and palpitations.  Gastrointestinal:  Positive for abdominal pain (RUQ abdominal pain). Negative for constipation, diarrhea, nausea and vomiting.  Endocrine: Negative for polydipsia, polyphagia  and polyuria.  Genitourinary:  Negative for difficulty urinating and dysuria.  Musculoskeletal:  Negative for arthralgias, back pain and myalgias.  Skin:  Negative for rash.  Neurological:  Negative for headaches.  Psychiatric/Behavioral:  Negative for dysphoric mood. The patient is not nervous/anxious.     Current Outpatient Medications on File Prior to Visit  Medication Sig Dispense Refill   ACCU-CHEK GUIDE test strip 1 each by Other route daily. Check blood sugar fasting in the morning daily. 100 each 2   alendronate (FOSAMAX) 70 MG tablet Take 1 tablet (70 mg total) by mouth once a week. 6 tablet 2   BD PEN NEEDLE NANO U/F 32G X 4 MM MISC Use new needle with each injection 50 each 1   Blood Glucose Monitoring Suppl (ACCU-CHEK GUIDE) w/Device KIT 1 each by Other route daily. 1 kit 0   estradiol (ESTRACE) 0.1 MG/GM vaginal cream INSERT 1 APPLICATORFUL VAGINALLY 3 TIMES A WEEK 42.5 g 3   ezetimibe (ZETIA) 10 MG tablet Take 1 tablet (10 mg total) by mouth daily. 90 tablet 3   fluticasone (FLONASE) 50  MCG/ACT nasal spray Place 2 sprays into both nostrils daily. 16 g 6   gabapentin (NEURONTIN) 300 MG capsule Take 1 capsule (300 mg total) by mouth 3 (three) times daily. 270 capsule 2   insulin degludec (TRESIBA FLEXTOUCH) 200 UNIT/ML FlexTouch Pen Inject 12 Units into the skin at bedtime. 6 mL 0   Lancets (ONETOUCH ULTRASOFT) lancets 1 each by Other route daily. Use as instructed 100 each 2   lisinopril (ZESTRIL) 20 MG tablet Take 1 tablet (20 mg total) by mouth daily. 90 tablet 1   meloxicam (MOBIC) 15 MG tablet Take 1 tablet (15 mg total) by mouth daily. 30 tablet 0   metoprolol tartrate (LOPRESSOR) 25 MG tablet Take 1 tablet (25 mg total) by mouth 2 (two) times daily. 180 tablet 2   mirtazapine (REMERON) 15 MG tablet Take 1 tablet (15 mg total) by mouth at bedtime. 90 tablet 1   mupirocin ointment (BACTROBAN) 2 % Apply 1 Application topically 2 (two) times daily. 22 g 2   pantoprazole  (PROTONIX) 40 MG tablet Take 1 tablet (40 mg total) by mouth daily. 90 tablet 2   rosuvastatin (CRESTOR) 40 MG tablet Take 1 tablet (40 mg total) by mouth at bedtime. 90 tablet 2   Vitamin D, Ergocalciferol, (DRISDOL) 1.25 MG (50000 UNIT) CAPS capsule Take 1 capsule (50,000 Units total) by mouth every 7 (seven) days. 12 capsule 6   No current facility-administered medications on file prior to visit.   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 No pertinent family history. Social History   Socioeconomic History   Marital status: Widowed    Spouse name: Not on file   Number of children: Not on file   Years of education: Not on file   Highest education level: 8th grade  Occupational History   Occupation: Disabled  Tobacco Use   Smoking status: Former    Current packs/day: 0.00    Average packs/day: 1 pack/day for 5.0 years (5.0 ttl pk-yrs)    Types: Cigarettes    Start date: 03/15/1982    Quit date: 03/16/1987    Years since quitting: 35.8   Smokeless tobacco: Never  Vaping Use   Vaping status: Never Used  Substance and Sexual Activity   Alcohol use: No   Drug use: No   Sexual activity: Not Currently  Other Topics Concern   Not on file  Social History Narrative   Not on file   Social Determinants of Health   Financial Resource Strain: Low Risk  (09/07/2022)   Overall Financial Resource  Strain (CARDIA)    Difficulty of Paying Living Expenses: Not hard at all  Food Insecurity: No Food Insecurity (09/07/2022)   Hunger Vital Sign    Worried About Running Out of Food in the Last Year: Never true    Ran Out of Food in the Last Year: Never true  Transportation Needs: No  Transportation Needs (09/07/2022)   PRAPARE - Administrator, Civil Service (Medical): No    Lack of Transportation (Non-Medical): No  Physical Activity: Sufficiently Active (09/07/2022)   Exercise Vital Sign    Days of Exercise per Week: 5 days    Minutes of Exercise per Session: 30 min  Stress: No Stress Concern Present (09/07/2022)   [employer] of Occupational Health - Occupational Stress Questionnaire    Feeling of Stress : Not at all  Social Connections: Moderately Isolated (09/07/2022)   Social Connection and Isolation Panel [NHANES]    Frequency of Communication with Friends and Family: Three times a week    Frequency of Social Gatherings with Friends and Family: Twice a week    Attends Religious Services: More than 4 times per year    Active Member of [employer] or Organizations: No    Attends Banker Meetings: Never    Marital Status: Widowed    Objective:  BP (!) 130/58   Pulse 60   Temp (!) 97.4 F (36.3 C)   Resp 12   Ht 5\' 2"  (1.575 m)   Wt 123 lb (55.8 kg)   LMP  (LMP Unknown)   SpO2 95%   BMI 22.50 kg/m      12/30/2022    9:48 AM 12/20/2022   10:02 AM 12/13/2022    2:44 PM  BP/Weight  Systolic BP 130 100 100  Diastolic BP 58 64 60  Wt. (Lbs) 123 124 124.4  BMI 22.5 kg/m2 22.68 kg/m2 22.75 kg/m2    Physical Exam Vitals reviewed.  Constitutional:      Appearance: Normal appearance.  Cardiovascular:     Rate and Rhythm: Normal rate and regular rhythm.     Heart sounds: Normal heart sounds.  Pulmonary:     Effort: Pulmonary effort is normal.     Breath sounds: Normal breath sounds.  Abdominal:     General: Bowel sounds are normal.     Palpations:  Abdomen is soft.     Tenderness: There is abdominal tenderness. There is no right CVA tenderness, left CVA tenderness, guarding or rebound.     Hernia: No hernia is present.  Neurological:     Mental Status: She is alert and oriented to person, place, and time.  Psychiatric:        Mood and Affect: Mood normal.        Behavior: Behavior normal.     Diabetic Foot Exam - Simple   No data filed      Lab Results  Component Value Date   WBC 5.4 10/20/2022   HGB 13.8 10/20/2022   HCT 44.5 10/20/2022   PLT 191 10/20/2022   GLUCOSE 63 (L) 10/20/2022   CHOL 179 10/20/2022   TRIG 109 10/20/2022   HDL 67 10/20/2022   LDLCALC 93 10/20/2022   ALT 20 10/20/2022   AST 26 10/20/2022   NA 145 (H) 10/20/2022   K 4.4 10/20/2022   CL 104 10/20/2022   CREATININE 0.87 10/20/2022   BUN 9 10/20/2022   CO2 24 10/20/2022   TSH 2.080 03/29/2022   HGBA1C 7.1 (H) 10/20/2022   MICROALBUR 30 07/21/2020      Assessment & Plan:    Generalized abdominal pain Assessment & Plan: Continue to have intermittent pain Denies any current stinging or burning with urination Will continue to monitor pain  Orders: -     POCT URINALYSIS DIP (CLINITEK)  Right lower quadrant abdominal pain Assessment & Plan:  Will continue to monitor pain Sent for CT to rule out possible stone.  Will continue to take tylenol as needed for pain  Orders: -     CT ABDOMEN PELVIS WO CONTRAST; Future -     CBC with Differential/Platelet -     Comprehensive metabolic panel -     Lipid panel -     T4, free -     TSH     No orders of the defined types were placed in this encounter.   Orders Placed This Encounter  Procedures   CT ABDOMEN PELVIS WO CONTRAST   CBC with Differential/Platelet   Comprehensive metabolic panel   Lipid panel   T4, free   TSH   POCT URINALYSIS DIP (CLINITEK)     Follow-up: No follow-ups on file.   I,Marla I Leal-Borjas,acting as a scribe for [employer], PA.,have documented all  relevant documentation on the behalf of Langley Gauss, PA,as directed by  Langley Gauss, PA while in the presence of Langley Gauss, [place].   An After Visit Summary was printed and given to the patient.  Langley Gauss, [place] [facility] [phone]

## 2022-12-30 NOTE — Assessment & Plan Note (Addendum)
Continue to have intermittent pain Denies any current stinging or burning with urination Will continue to monitor pain

## 2022-12-31 LAB — COMPREHENSIVE METABOLIC PANEL
ALT: 17 [IU]/L (ref 0–32)
AST: 25 [IU]/L (ref 0–40)
Albumin: 4.7 g/dL (ref 3.7–4.7)
Alkaline Phosphatase: 67 [IU]/L (ref 44–121)
BUN/Creatinine Ratio: 18 (ref 12–28)
BUN: 19 mg/dL (ref 8–27)
Bilirubin Total: 0.6 mg/dL (ref 0.0–1.2)
CO2: 21 mmol/L (ref 20–29)
Calcium: 9.8 mg/dL (ref 8.7–10.3)
Chloride: 101 mmol/L (ref 96–106)
Creatinine, Ser: 1.04 mg/dL — ABNORMAL HIGH (ref 0.57–1.00)
Globulin, Total: 2.3 g/dL (ref 1.5–4.5)
Glucose: 74 mg/dL (ref 70–99)
Potassium: 5.3 mmol/L — ABNORMAL HIGH (ref 3.5–5.2)
Sodium: 140 mmol/L (ref 134–144)
Total Protein: 7 g/dL (ref 6.0–8.5)
eGFR: 53 mL/min/{1.73_m2} — ABNORMAL LOW (ref >59)

## 2022-12-31 LAB — CBC WITH DIFFERENTIAL/PLATELET
Basophils Absolute: 0.1 10*3/uL (ref 0.0–0.2)
Basos: 1 %
EOS (ABSOLUTE): 0.2 10*3/uL (ref 0.0–0.4)
Eos: 2 %
Hematocrit: 45.2 % (ref 34.0–46.6)
Hemoglobin: 14.4 g/dL (ref 11.1–15.9)
Immature Grans (Abs): 0 10*3/uL (ref 0.0–0.1)
Immature Granulocytes: 0 %
Lymphocytes Absolute: 2.7 10*3/uL (ref 0.7–3.1)
Lymphs: 28 %
MCH: 28.4 pg (ref 26.6–33.0)
MCHC: 31.9 g/dL (ref 31.5–35.7)
MCV: 89 fL (ref 79–97)
Monocytes Absolute: 0.7 10*3/uL (ref 0.1–0.9)
Monocytes: 7 %
Neutrophils Absolute: 6.2 10*3/uL (ref 1.4–7.0)
Neutrophils: 62 %
Platelets: 247 10*3/uL (ref 150–450)
RBC: 5.07 x10E6/uL (ref 3.77–5.28)
RDW: 14.1 % (ref 11.7–15.4)
WBC: 9.9 10*3/uL (ref 3.4–10.8)

## 2022-12-31 LAB — LIPID PANEL
Chol/HDL Ratio: 2.8 {ratio} (ref 0.0–4.4)
Cholesterol, Total: 174 mg/dL (ref 100–199)
HDL: 63 mg/dL (ref >39)
LDL Chol Calc (NIH): 91 mg/dL (ref 0–99)
Triglycerides: 110 mg/dL (ref 0–149)
VLDL Cholesterol Cal: 20 mg/dL (ref 5–40)

## 2022-12-31 LAB — TSH: TSH: 1.97 u[IU]/mL (ref 0.450–4.500)

## 2022-12-31 LAB — T4, FREE: Free T4: 1.56 ng/dL (ref 0.82–1.77)

## 2023-01-05 ENCOUNTER — Other Ambulatory Visit: Payer: Self-pay

## 2023-01-05 DIAGNOSIS — E1169 Type 2 diabetes mellitus with other specified complication: Secondary | ICD-10-CM

## 2023-01-05 DIAGNOSIS — R1031 Right lower quadrant pain: Secondary | ICD-10-CM

## 2023-01-06 ENCOUNTER — Other Ambulatory Visit: Payer: Self-pay

## 2023-01-06 MED ORDER — TRESIBA FLEXTOUCH 200 UNIT/ML ~~LOC~~ SOPN
12.0000 [IU] | PEN_INJECTOR | Freq: Every day | SUBCUTANEOUS | 0 refills | Status: DC
Start: 2023-01-06 — End: 2023-02-28

## 2023-01-06 MED ORDER — LIRAGLUTIDE 18 MG/3ML ~~LOC~~ SOPN: 1.2000 mg | PEN_INJECTOR | Freq: Every day | SUBCUTANEOUS | 1 refills | Status: DC

## 2023-01-14 DIAGNOSIS — R32 Unspecified urinary incontinence: Secondary | ICD-10-CM | POA: Diagnosis not present

## 2023-01-14 DIAGNOSIS — E119 Type 2 diabetes mellitus without complications: Secondary | ICD-10-CM | POA: Diagnosis not present

## 2023-01-18 ENCOUNTER — Encounter: Payer: Self-pay | Admitting: Family Medicine

## 2023-01-18 DIAGNOSIS — I700 Atherosclerosis of aorta: Secondary | ICD-10-CM | POA: Diagnosis not present

## 2023-01-18 DIAGNOSIS — N200 Calculus of kidney: Secondary | ICD-10-CM | POA: Diagnosis not present

## 2023-01-18 DIAGNOSIS — K449 Diaphragmatic hernia without obstruction or gangrene: Secondary | ICD-10-CM | POA: Diagnosis not present

## 2023-01-24 ENCOUNTER — Telehealth: Payer: Self-pay

## 2023-01-24 NOTE — Telephone Encounter (Signed)
Copied from CRM [id number]. Topic: Clinical - Lab/Test Results >> Jan 24, 2023  9:18 AM Cassiday T wrote: Reason for CRM: patient would like the results from her test that was done last week.

## 2023-01-25 ENCOUNTER — Telehealth: Payer: Self-pay

## 2023-01-25 NOTE — Telephone Encounter (Signed)
Rebecca Hodge called this morning stating that she was calling for the lab work that was done in the office and the CT ABDOMEN PELVIS WO CONTRAST. Rebecca Hodge was notified that the CT has not been signed off on just yet but as for the blood work, the patient was notified of the lab results on 01/04/2023.  I notified Rebecca Hodge of the lab work from 12/30/2022 stating what Dr. Sedalia Muta had stated: Blood count normal. Liver function normal. Kidney function mildly abnormal. Recommend hydrate and repeat in 1 week. Thyroid function normal. Cholesterol: normal. Rebecca Hodge stated that Rebecca Hodge has NOT been hydrating but she will make sure she does. I have the patient scheduled in 1 week from today to recheck the kidney function.  Rebecca Hodge was notified that someone will call her back the results of the CT once it has been signed off.

## 2023-01-31 ENCOUNTER — Other Ambulatory Visit: Payer: Self-pay

## 2023-01-31 DIAGNOSIS — N289 Disorder of kidney and ureter, unspecified: Secondary | ICD-10-CM

## 2023-02-01 ENCOUNTER — Other Ambulatory Visit: Payer: Medicare HMO

## 2023-02-01 DIAGNOSIS — N289 Disorder of kidney and ureter, unspecified: Secondary | ICD-10-CM

## 2023-02-01 LAB — COMPREHENSIVE METABOLIC PANEL
ALT: 26 [IU]/L (ref 0–32)
AST: 29 [IU]/L (ref 0–40)
Albumin: 4.3 g/dL (ref 3.7–4.7)
Alkaline Phosphatase: 66 [IU]/L (ref 44–121)
BUN/Creatinine Ratio: 21 (ref 12–28)
BUN: 18 mg/dL (ref 8–27)
Bilirubin Total: 0.5 mg/dL (ref 0.0–1.2)
CO2: 25 mmol/L (ref 20–29)
Calcium: 10.2 mg/dL (ref 8.7–10.3)
Chloride: 100 mmol/L (ref 96–106)
Creatinine, Ser: 0.84 mg/dL (ref 0.57–1.00)
Globulin, Total: 2.5 g/dL (ref 1.5–4.5)
Glucose: 83 mg/dL (ref 70–99)
Potassium: 5.2 mmol/L (ref 3.5–5.2)
Sodium: 139 mmol/L (ref 134–144)
Total Protein: 6.8 g/dL (ref 6.0–8.5)
eGFR: 68 mL/min/{1.73_m2} (ref >59)

## 2023-02-02 ENCOUNTER — Other Ambulatory Visit: Payer: Self-pay

## 2023-02-02 DIAGNOSIS — E1169 Type 2 diabetes mellitus with other specified complication: Secondary | ICD-10-CM

## 2023-02-02 DIAGNOSIS — Z794 Long term (current) use of insulin: Secondary | ICD-10-CM

## 2023-02-02 DIAGNOSIS — R1084 Generalized abdominal pain: Secondary | ICD-10-CM

## 2023-02-02 MED ORDER — ONETOUCH ULTRASOFT LANCETS MISC: 1.0000 | Freq: Every day | 2 refills | Status: AC

## 2023-02-02 MED ORDER — ACCU-CHEK GUIDE TEST VI STRP: ORAL_STRIP | 12 refills | Status: AC

## 2023-02-02 MED ORDER — ROSUVASTATIN CALCIUM 40 MG PO TABS: 40.0000 mg | ORAL_TABLET | Freq: Every day | ORAL | 2 refills | Status: DC

## 2023-02-02 MED ORDER — BD PEN NEEDLE NANO U/F 32G X 4 MM MISC: 1 refills | Status: AC

## 2023-02-02 MED ORDER — PANTOPRAZOLE SODIUM 40 MG PO TBEC: 40.0000 mg | DELAYED_RELEASE_TABLET | Freq: Every day | ORAL | 2 refills | Status: DC

## 2023-02-07 ENCOUNTER — Telehealth: Payer: Self-pay | Admitting: Physician Assistant

## 2023-02-07 NOTE — Telephone Encounter (Signed)
[employer] - diabetic shoes

## 2023-02-13 DIAGNOSIS — E119 Type 2 diabetes mellitus without complications: Secondary | ICD-10-CM | POA: Diagnosis not present

## 2023-02-13 DIAGNOSIS — R32 Unspecified urinary incontinence: Secondary | ICD-10-CM | POA: Diagnosis not present

## 2023-02-21 ENCOUNTER — Other Ambulatory Visit: Payer: Self-pay | Admitting: Family Medicine

## 2023-02-28 ENCOUNTER — Ambulatory Visit (INDEPENDENT_AMBULATORY_CARE_PROVIDER_SITE_OTHER): Payer: Medicare HMO

## 2023-02-28 VITALS — BP 102/70 | HR 48 | Temp 98.0°F | Resp 16 | Ht 62.0 in | Wt 124.2 lb

## 2023-02-28 DIAGNOSIS — R911 Solitary pulmonary nodule: Secondary | ICD-10-CM | POA: Insufficient documentation

## 2023-02-28 DIAGNOSIS — E1169 Type 2 diabetes mellitus with other specified complication: Secondary | ICD-10-CM | POA: Diagnosis not present

## 2023-02-28 DIAGNOSIS — E782 Mixed hyperlipidemia: Secondary | ICD-10-CM | POA: Diagnosis not present

## 2023-02-28 DIAGNOSIS — K449 Diaphragmatic hernia without obstruction or gangrene: Secondary | ICD-10-CM | POA: Insufficient documentation

## 2023-02-28 MED ORDER — TRESIBA FLEXTOUCH 200 UNIT/ML ~~LOC~~ SOPN: 12.0000 [IU] | PEN_INJECTOR | Freq: Every day | SUBCUTANEOUS | 0 refills | Status: AC

## 2023-02-28 NOTE — Patient Instructions (Signed)
VISIT SUMMARY:  During today's visit, we discussed your abdominal pain, daily diarrhea, and reviewed your history of recurrent urinary tract infections. We also addressed your known large hiatal hernia, lung nodule, kidney stone, diverticulosis, and hernias. Your diabetes management was reviewed, and your overall health maintenance was assessed.  YOUR PLAN:  -LARGE HIATAL HERNIA: A large hiatal hernia occurs when part of the stomach pushes up through the diaphragm. It can cause upper abdominal pain and heartburn. We recommend managing it with lifestyle changes such as eating small, frequent meals and walking after eating. Please seek evaluation if you experience severe abdominal pain.  -RIGHT LOWER LUNG NODULE: A lung nodule is a small growth in the lung. Your nodule has grown slightly but is likely benign. We will repeat a CT scan in one year to monitor any changes.  -RIGHT KIDNEY STONE: A kidney stone is a small, hard deposit that forms in the kidneys. Your 2 mm stone is not causing symptoms and is likely to pass on its own. Please monitor for any symptoms of obstruction.  -DIVERTICULOSIS: Diverticulosis is a condition where small pouches form in the colon. It is usually asymptomatic unless inflamed. We will monitor for any symptoms of diverticulitis.  -INGUINAL AND UMBILICAL HERNIAS: Hernias occur when an organ pushes through an opening in the muscle or tissue. Your hernias are small and not causing symptoms. Surgery is not needed unless they become painful or increase in size. Please monitor for any changes.  -DIABETES MELLITUS TYPE 2: Type 2 diabetes is a condition that affects blood sugar control. Your diabetes is well-controlled with a hemoglobin A1c of 7.1%. Continue with your current management plan and follow up with your primary care provider for routine care.  -GENERAL HEALTH MAINTENANCE: Your overall health is good for your age. Recent lab results show normal kidney function,  cholesterol levels, and blood counts. Continue with routine health maintenance and screenings.

## 2023-02-28 NOTE — Progress Notes (Signed)
Subjective:  Patient ID: Rebecca Hodge, female    DOB: 17-Apr-1940  Age: 82 y.o. MRN: [id number]  Chief Complaint  Patient presents with   Medical Management of Chronic Issues    HPI  Discussed the use of AI scribe software for clinical note transcription with the patient, who gave verbal consent to proceed.  History of Present Illness          The patient, with a history of recurrent urinary tract infections, presented with complaints of abdominal pain, specifically around the umbilical region and right upper quadrant. The pain, described as sometimes reaching a severity of 10 on a scale of 1 to 10, is intermittent and not related to food intake. The patient also reported daily diarrhea but denied any nausea, vomiting, or changes in urine.  The patient has a known large hiatal hernia, which she is aware could cause upper abdominal discomfort and heartburn. However, the patient's pain is primarily in the mid and lower abdomen and some RUQ. The patient also reported a history of a small spot in the lung, which was not deemed concerning by a previous physician.  The patient has a history of surgeries, including gallbladder removal and hysterectomy, and currently has a small umbilical hernia and an inguinal hernia in the left groin. The patient also reported thinning of the bones and arthritis. Despite these conditions, the patient's diabetes is well-controlled, with a last recorded hemoglobin A1c of 7.1 in August.     10/20/2022    8:22 AM 09/07/2022    9:23 AM 07/08/2022    8:51 AM 11/03/2021    2:24 PM 06/22/2021    4:22 PM  Depression screen PHQ 2/9  Decreased Interest 0 0 0 0 0  Down, Depressed, Hopeless 0 0 0 0 0  PHQ - 2 Score 0 0 0 0 0  Altered sleeping 0  0 0   Tired, decreased energy 0  0 0   Change in appetite 0  0 0   Feeling bad or failure about yourself  0  0 0   Trouble concentrating 0  0 0   Moving slowly or fidgety/restless 0  0 0   Suicidal thoughts 0  0 0   PHQ-9  Score 0  0 0   Difficult doing work/chores Not difficult at all  Not difficult at all          10/20/2022    8:22 AM  Fall Risk   Falls in the past year? 0  Number falls in past yr: 0  Injury with Fall? 0  Risk for fall due to : No Fall Risks  Follow up Falls evaluation completed    Patient Care Team: Langley Gauss, [place] as PCP - General (Physician Assistant) Zettie Pho, [facility] (Inactive) as Pharmacist (Pharmacist)   Review of Systems  Constitutional: Negative.   HENT: Negative.    Eyes: Negative.   Respiratory: Negative.    Gastrointestinal:  Positive for abdominal pain and diarrhea.  Endocrine: Negative.   Genitourinary: Negative.   Musculoskeletal: Negative.   Neurological: Negative.   Psychiatric/Behavioral: Negative.    All other systems reviewed and are negative.   Current Outpatient Medications on File Prior to Visit  Medication Sig Dispense Refill   alendronate (FOSAMAX) 70 MG tablet Take 1 tablet (70 mg total) by mouth once a week. 6 tablet 2   BD PEN NEEDLE NANO U/F 32G X 4 MM MISC Use new needle with each injection 50 each 1  Blood Glucose Monitoring Suppl (ACCU-CHEK GUIDE) w/Device KIT 1 each by Other route daily. 1 kit 0   estradiol (ESTRACE) 0.1 MG/GM vaginal cream INSERT 1 APPLICATORFUL VAGINALLY 3 TIMES A WEEK 42.5 g 3   ezetimibe (ZETIA) 10 MG tablet Take 1 tablet (10 mg total) by mouth daily. 90 tablet 3   fluticasone (FLONASE) 50 MCG/ACT nasal spray Place 2 sprays into both nostrils daily. 16 g 6   gabapentin (NEURONTIN) 300 MG capsule Take 1 capsule (300 mg total) by mouth 3 (three) times daily. 270 capsule 2   glucose blood (ACCU-CHEK GUIDE TEST) test strip Check FBS daily 100 each 12   Lancets (ONETOUCH ULTRASOFT) lancets 1 each by Other route daily. Use as instructed 100 each 2   liraglutide (VICTOZA) 18 MG/3ML SOPN Inject 1.2 mg into the skin daily. 9 mL 1   lisinopril (ZESTRIL) 20 MG tablet Take 1 tablet (20 mg total) by mouth daily. 90 tablet 1    meloxicam (MOBIC) 15 MG tablet Take 1 tablet (15 mg total) by mouth daily. 30 tablet 0   metoprolol tartrate (LOPRESSOR) 25 MG tablet Take 1 tablet (25 mg total) by mouth 2 (two) times daily. 180 tablet 2   mirtazapine (REMERON) 15 MG tablet Take 1 tablet (15 mg total) by mouth at bedtime. 90 tablet 1   mupirocin ointment (BACTROBAN) 2 % Apply 1 Application topically 2 (two) times daily. 22 g 2   pantoprazole (PROTONIX) 40 MG tablet Take 1 tablet (40 mg total) by mouth daily. 90 tablet 2   rosuvastatin (CRESTOR) 40 MG tablet Take 1 tablet (40 mg total) by mouth at bedtime. 90 tablet 2   Vitamin D, Ergocalciferol, (DRISDOL) 1.25 MG (50000 UNIT) CAPS capsule Take 1 capsule (50,000 Units total) by mouth every 7 (seven) days. 12 capsule 6   No current facility-administered medications on file prior to visit.   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 No pertinent family history. Social History   Socioeconomic History   Marital status: Widowed    Spouse name: Not on file   Number of children: Not on file   Years of education: Not on file   Highest education level: 8th grade  Occupational History   Occupation: Disabled  Tobacco Use   Smoking status: Former    Current packs/day: 0.00    Average packs/day: 1 pack/day for 5.0 years (5.0 ttl pk-yrs)    Types: Cigarettes    Start date: 03/15/1982    Quit  date: 03/16/1987    Years since quitting: 35.9   Smokeless tobacco: Never  Vaping Use   Vaping status: Never Used  Substance and Sexual Activity   Alcohol use: No   Drug use: No   Sexual activity: Not Currently  Other Topics Concern   Not on file  Social History Narrative   Not on file   Social Drivers of Health  Financial Resource Strain: Low Risk  (09/07/2022)   Overall Financial Resource Strain (CARDIA)    Difficulty of Paying Living Expenses: Not hard at all  Food Insecurity: No Food Insecurity (09/07/2022)   Hunger Vital Sign    Worried About Running Out of Food in the Last Year: Never true    Ran Out of Food in the Last Year: Never true  Transportation Needs: No Transportation Needs (09/07/2022)   PRAPARE - Administrator, Civil Service (Medical): No    Lack of Transportation (Non-Medical): No  Physical Activity: Sufficiently Active (09/07/2022)   Exercise Vital Sign    Days of Exercise per Week: 5 days    Minutes of Exercise per Session: 30 min  Stress: No Stress Concern Present (09/07/2022)   [employer] of Occupational Health - Occupational Stress Questionnaire    Feeling of Stress : Not at all  Social Connections: Moderately Isolated (09/07/2022)   Social Connection and Isolation Panel [NHANES]    Frequency of Communication with Friends and Family: Three times a week    Frequency of Social Gatherings with Friends and Family: Twice a week    Attends Religious Services: More than 4 times per year    Active Member of [employer] or Organizations: No    Attends Banker Meetings: Never    Marital Status: Widowed    Objective:  BP 102/70 (BP Location: Right Arm, Patient Position: Sitting, Cuff Size: Normal)   Pulse (!) 48   Temp 98 F (36.7 C) (Temporal)   Resp 16   Ht 5\' 2"  (1.575 m)   Wt 124 lb 3.2 oz (56.3 kg)   LMP  (LMP Unknown)   SpO2 98%   BMI 22.72 kg/m      02/28/2023    2:44 PM 12/30/2022    9:48 AM 12/20/2022   10:02 AM   BP/Weight  Systolic BP 102 130 100  Diastolic BP 70 58 64  Wt. (Lbs) 124.2 123 124  BMI 22.72 kg/m2 22.5 kg/m2 22.68 kg/m2    Physical Exam Vitals and nursing note reviewed.  Constitutional:      Appearance: Normal appearance.  HENT:     Head: Normocephalic and atraumatic.  Cardiovascular:     Rate and Rhythm: Normal rate and regular rhythm.  Pulmonary:     Effort: Pulmonary effort is normal.     Breath sounds: Normal breath sounds.  Abdominal:     General: There is no distension.     Tenderness: There is abdominal tenderness (minimal RUQ. very small umbilical hernia noted).  Musculoskeletal:        General: Normal range of motion.  Neurological:     General: No focal deficit present.     Mental Status: She is alert.  Psychiatric:        Mood and Affect: Mood normal.     Diabetic Foot Exam - Simple   No data filed      Lab Results  Component Value Date   WBC 9.9 12/30/2022   HGB 14.4 12/30/2022   HCT 45.2 12/30/2022   PLT 247 12/30/2022   GLUCOSE 83 02/01/2023   CHOL 174 12/30/2022   TRIG 110 12/30/2022   HDL 63 12/30/2022   LDLCALC 91 12/30/2022   ALT 26 02/01/2023   AST 29 02/01/2023   NA 139 02/01/2023   K 5.2 02/01/2023   CL 100 02/01/2023   CREATININE 0.84 02/01/2023   BUN 18 02/01/2023   CO2 25 02/01/2023   TSH  1.970 12/30/2022   HGBA1C 7.1 (H) 10/20/2022   MICROALBUR 30 07/21/2020      Assessment & Plan:    Large hiatal hernia Assessment & Plan: Large hiatal hernia identified on CT scan causing epigastric pain and heartburn occasionally and mild pain.. Surgical intervention not recommended due to age and health status; risks outweigh benefits. Advised conservative management with lifestyle modifications. - Provide information on managing hiatal hernia - Advise small, frequent meals - Encourage postprandial walking - Instruct to seek evaluation if severe abdominal pain occurs - takes protonix 40 mg daily.   No intervention needed for   the small periumbilical hernia and inguinal hernia   Combined hyperlipidemia associated with type 2 diabetes mellitus (HCC) Assessment & Plan: Well-controlled with hemoglobin A1c of 7.1% as of August. Managing condition well with current regimen. - Continue current diabetes management plan with tresiba 12 U at bedtime, victoza 1.2 mg daily,  - lipids well controlled with Zetia 10 mg daily, crestor 40 mg daily - Follow up with primary care provider for routine diabetes care  Orders: -     Tresiba FlexTouch; Inject 12 Units into the skin at bedtime.  Dispense: 6 mL; Refill: 0  Right lower lobe pulmonary nodule Assessment & Plan: 6 mm nodule in the right lower lung identified on CT scan, increased from 4 mm in 2021. Likely benign based on slow growth. Recommended repeat CT scan in one year to monitor changes. - Order repeat CT scan in one year   Orders: -     CT CHEST WO CONTRAST; Future     Meds ordered this encounter  Medications   insulin degludec (TRESIBA FLEXTOUCH) 200 UNIT/ML FlexTouch Pen    Sig: Inject 12 Units into the skin at bedtime.    Dispense:  6 mL    Refill:  0    Orders Placed This Encounter  Procedures   CT Chest Wo Contrast     Follow-up: Return in about 2 months (around 05/01/2023).    An After Visit Summary was printed and given to the patient.  Windell Moment, MD [facility] [phone]

## 2023-02-28 NOTE — Assessment & Plan Note (Addendum)
Large hiatal hernia identified on CT scan causing epigastric pain and heartburn occasionally and mild pain.. Surgical intervention not recommended due to age and health status; risks outweigh benefits. Advised conservative management with lifestyle modifications. - Provide information on managing hiatal hernia - Advise small, frequent meals - Encourage postprandial walking - Instruct to seek evaluation if severe abdominal pain occurs - takes protonix 40 mg daily.   No intervention needed for  the small periumbilical hernia and inguinal hernia

## 2023-02-28 NOTE — Assessment & Plan Note (Signed)
6 mm nodule in the right lower lung identified on CT scan, increased from 4 mm in 2021. Likely benign based on slow growth. Recommended repeat CT scan in one year to monitor changes. - Order repeat CT scan in one year

## 2023-02-28 NOTE — Assessment & Plan Note (Signed)
Well-controlled with hemoglobin A1c of 7.1% as of August. Managing condition well with current regimen. - Continue current diabetes management plan with tresiba 12 U at bedtime, victoza 1.2 mg daily,  - lipids well controlled with Zetia 10 mg daily, crestor 40 mg daily - Follow up with primary care provider for routine diabetes care

## 2023-03-03 ENCOUNTER — Other Ambulatory Visit: Payer: Self-pay

## 2023-03-16 DIAGNOSIS — E119 Type 2 diabetes mellitus without complications: Secondary | ICD-10-CM | POA: Diagnosis not present

## 2023-03-16 DIAGNOSIS — R32 Unspecified urinary incontinence: Secondary | ICD-10-CM | POA: Diagnosis not present

## 2023-03-23 ENCOUNTER — Ambulatory Visit: Payer: Medicare HMO | Admitting: Physician Assistant

## 2023-04-13 ENCOUNTER — Telehealth: Payer: Self-pay

## 2023-04-13 NOTE — Telephone Encounter (Signed)
Patient approved for Liraglutide 18mg /18mL pen-injectors till 03/13/2024.

## 2023-04-16 DIAGNOSIS — R32 Unspecified urinary incontinence: Secondary | ICD-10-CM | POA: Diagnosis not present

## 2023-04-16 DIAGNOSIS — E119 Type 2 diabetes mellitus without complications: Secondary | ICD-10-CM | POA: Diagnosis not present

## 2023-04-22 ENCOUNTER — Ambulatory Visit (INDEPENDENT_AMBULATORY_CARE_PROVIDER_SITE_OTHER): Payer: Medicare HMO | Admitting: Physician Assistant

## 2023-04-22 ENCOUNTER — Encounter: Payer: Self-pay | Admitting: Physician Assistant

## 2023-04-22 VITALS — BP 100/60 | HR 54 | Temp 97.8°F | Ht 62.0 in | Wt 127.0 lb

## 2023-04-22 DIAGNOSIS — E1159 Type 2 diabetes mellitus with other circulatory complications: Secondary | ICD-10-CM | POA: Diagnosis not present

## 2023-04-22 DIAGNOSIS — D229 Melanocytic nevi, unspecified: Secondary | ICD-10-CM | POA: Diagnosis not present

## 2023-04-22 DIAGNOSIS — J301 Allergic rhinitis due to pollen: Secondary | ICD-10-CM | POA: Diagnosis not present

## 2023-04-22 DIAGNOSIS — M810 Age-related osteoporosis without current pathological fracture: Secondary | ICD-10-CM | POA: Diagnosis not present

## 2023-04-22 DIAGNOSIS — Z794 Long term (current) use of insulin: Secondary | ICD-10-CM | POA: Diagnosis not present

## 2023-04-22 DIAGNOSIS — E1169 Type 2 diabetes mellitus with other specified complication: Secondary | ICD-10-CM | POA: Diagnosis not present

## 2023-04-22 DIAGNOSIS — E782 Mixed hyperlipidemia: Secondary | ICD-10-CM | POA: Diagnosis not present

## 2023-04-22 DIAGNOSIS — N3946 Mixed incontinence: Secondary | ICD-10-CM

## 2023-04-22 DIAGNOSIS — I152 Hypertension secondary to endocrine disorders: Secondary | ICD-10-CM | POA: Diagnosis not present

## 2023-04-22 DIAGNOSIS — E119 Type 2 diabetes mellitus without complications: Secondary | ICD-10-CM | POA: Insufficient documentation

## 2023-04-22 MED ORDER — TRIAMCINOLONE ACETONIDE 40 MG/ML IJ SUSP
40.0000 mg | Freq: Once | INTRAMUSCULAR | Status: AC
  Administered 2023-04-22: 40 mg via INTRAMUSCULAR

## 2023-04-22 NOTE — Progress Notes (Signed)
 Subjective:  Patient ID: Rebecca Hodge, female    DOB: 05/28/1940  Age: 83 y.o. MRN: [id number]  Chief Complaint  Patient presents with   Medical Management of Chronic Issues    HPI   Discussed the use of AI scribe software for clinical note transcription with the patient, who gave verbal consent to proceed.  History of Present Illness   The patient, with a history of lung disease and diabetes, presents with allergies. She reports that she used to receive a steroid shot from her previous doctor, Dr. Abran, which helped manage her symptoms. The patient is unable to recall the last time she received this shot. She also mentions that she was unable to receive the flu shot due to her allergies.  The patient also reports urinary issues, primarily nocturia. She mentions that she urinates frequently at night but not as much during the day. She has previously seen a female doctor who performed a procedure on her bladder. However, since the procedure, she has been experiencing bowel problems. The patient expresses a desire to see another female doctor for these issues.  The patient also has skin lesions that have been previously treated but appear to be recurring. She reports that one lesion was frozen but has since returned. She also mentions another lesion that was treated but has since grown larger. The patient expresses a desire to see a dermatologist for these skin issues.          04/22/2023    9:20 AM 10/20/2022    8:22 AM 09/07/2022    9:23 AM 07/08/2022    8:51 AM 11/03/2021    2:24 PM  Depression screen PHQ 2/9  Decreased Interest 0 0 0 0 0  Down, Depressed, Hopeless 0 0 0 0 0  PHQ - 2 Score 0 0 0 0 0  Altered sleeping 0 0  0 0  Tired, decreased energy 0 0  0 0  Change in appetite 0 0  0 0  Feeling bad or failure about yourself  0 0  0 0  Trouble concentrating 0 0  0 0  Moving slowly or fidgety/restless 0 0  0 0  Suicidal thoughts 0 0  0 0  PHQ-9 Score 0 0  0 0  Difficult doing  work/chores Not difficult at all Not difficult at all  Not difficult at all         04/22/2023    9:11 AM  Fall Risk   Falls in the past year? 0  Number falls in past yr: 0  Injury with Fall? 0  Risk for fall due to : No Fall Risks  Follow up Falls evaluation completed    Patient Care Team: Milon Cleaves, [place] as PCP - General (Physician Assistant) Nyle Rankin POUR, [facility] (Inactive) as Pharmacist (Pharmacist)   Review of Systems  Constitutional:  Negative for appetite change, fatigue and fever.  HENT:  Negative for congestion, ear pain, sinus pressure and sore throat.   Respiratory:  Negative for cough, chest tightness, shortness of breath and wheezing.   Cardiovascular:  Negative for chest pain and palpitations.  Gastrointestinal:  Negative for abdominal pain, constipation, diarrhea, nausea and vomiting.  Genitourinary:  Negative for dysuria and hematuria.  Musculoskeletal:  Negative for arthralgias, back pain, joint swelling and myalgias.  Skin:  Negative for rash.  Neurological:  Negative for dizziness, weakness and headaches.  Psychiatric/Behavioral:  Negative for dysphoric mood. The patient is not nervous/anxious.     Current Outpatient  Medications on File Prior to Visit  Medication Sig Dispense Refill   alendronate  (FOSAMAX ) 70 MG tablet Take 1 tablet (70 mg total) by mouth once a week. 6 tablet 2   BD PEN NEEDLE NANO U/F 32G X 4 MM MISC Use new needle with each injection 50 each 1   Blood Glucose Monitoring Suppl (ACCU-CHEK GUIDE) w/Device KIT 1 each by Other route daily. 1 kit 0   estradiol  (ESTRACE ) 0.1 MG/GM vaginal cream INSERT 1 APPLICATORFUL VAGINALLY 3 TIMES A WEEK 42.5 g 3   ezetimibe  (ZETIA ) 10 MG tablet Take 1 tablet (10 mg total) by mouth daily. 90 tablet 3   fluticasone  (FLONASE ) 50 MCG/ACT nasal spray Place 2 sprays into both nostrils daily. 16 g 6   gabapentin  (NEURONTIN ) 300 MG capsule Take 1 capsule (300 mg total) by mouth 3 (three) times daily. 270 capsule 2    glucose blood (ACCU-CHEK GUIDE TEST) test strip Check FBS daily 100 each 12   insulin  degludec (TRESIBA  FLEXTOUCH) 200 UNIT/ML FlexTouch Pen Inject 12 Units into the skin at bedtime. 6 mL 0   Lancets (ONETOUCH ULTRASOFT) lancets 1 each by Other route daily. Use as instructed 100 each 2   liraglutide  (VICTOZA ) 18 MG/3ML SOPN Inject 1.2 mg into the skin daily. 9 mL 1   lisinopril  (ZESTRIL ) 20 MG tablet Take 1 tablet (20 mg total) by mouth daily. 90 tablet 1   meloxicam  (MOBIC ) 15 MG tablet Take 1 tablet (15 mg total) by mouth daily. 30 tablet 0   metoprolol  tartrate (LOPRESSOR ) 25 MG tablet Take 1 tablet (25 mg total) by mouth 2 (two) times daily. 180 tablet 2   mirtazapine  (REMERON ) 15 MG tablet Take 1 tablet (15 mg total) by mouth at bedtime. 90 tablet 1   mupirocin  ointment (BACTROBAN ) 2 % Apply 1 Application topically 2 (two) times daily. 22 g 2   pantoprazole  (PROTONIX ) 40 MG tablet Take 1 tablet (40 mg total) by mouth daily. 90 tablet 2   rosuvastatin  (CRESTOR ) 40 MG tablet Take 1 tablet (40 mg total) by mouth at bedtime. 90 tablet 2   Vitamin D , Ergocalciferol , (DRISDOL ) 1.25 MG (50000 UNIT) CAPS capsule Take 1 capsule (50,000 Units total) by mouth every 7 (seven) days. 12 capsule 6   No current facility-administered medications on file prior to visit.   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 No  pertinent family history. Social History   Socioeconomic History   Marital status: Widowed    Spouse name: Not on file   Number of children: Not on file   Years of education: Not on file   Highest education level: 8th grade  Occupational History   Occupation: Disabled  Tobacco Use   Smoking status: Former    Current packs/day: 0.00    Average packs/day: 1 pack/day for 5.0 years (5.0 ttl pk-yrs)    Types: Cigarettes  Start date: 03/15/1982    Quit date: 03/16/1987    Years since quitting: 36.1   Smokeless tobacco: Never  Vaping Use   Vaping status: Never Used  Substance and Sexual Activity   Alcohol use: No   Drug use: No   Sexual activity: Not Currently  Other Topics Concern   Not on file  Social History Narrative   Not on file   Social Drivers of Health   Financial Resource Strain: Low Risk  (09/07/2022)   Overall Financial Resource Strain (CARDIA)    Difficulty of Paying Living Expenses: Not hard at all  Food Insecurity: No Food Insecurity (09/07/2022)   Hunger Vital Sign    Worried About Running Out of Food in the Last Year: Never true    Ran Out of Food in the Last Year: Never true  Transportation Needs: No Transportation Needs (09/07/2022)   PRAPARE - Administrator, Civil Service (Medical): No    Lack of Transportation (Non-Medical): No  Physical Activity: Sufficiently Active (09/07/2022)   Exercise Vital Sign    Days of Exercise per Week: 5 days    Minutes of Exercise per Session: 30 min  Stress: No Stress Concern Present (09/07/2022)   [employer] of Occupational Health - Occupational Stress Questionnaire    Feeling of Stress : Not at all  Social Connections: Moderately Isolated (09/07/2022)   Social Connection and Isolation Panel [NHANES]    Frequency of Communication with Friends and Family: Three times a week    Frequency of Social Gatherings with Friends and Family: Twice a week    Attends Religious Services: More than 4 times per year     Active Member of [employer] or Organizations: No    Attends Banker Meetings: Never    Marital Status: Widowed    Objective:  BP 100/60   Pulse (!) 54   Temp 97.8 F (36.6 C) (Temporal)   Ht 5' 2 (1.575 m)   Wt 127 lb (57.6 kg)   LMP  (LMP Unknown)   SpO2 98%   BMI 23.23 kg/m      04/22/2023   10:24 AM 04/22/2023    9:04 AM 02/28/2023    2:44 PM  BP/Weight  Systolic BP 100 198 102  Diastolic BP 60 52 70  Wt. (Lbs)  127 124.2  BMI  23.23 kg/m2 22.72 kg/m2    Physical Exam Vitals reviewed.  Constitutional:      Appearance: Normal appearance.  Neck:     Vascular: No carotid bruit.  Cardiovascular:     Rate and Rhythm: Normal rate and regular rhythm.     Heart sounds: Normal heart sounds.  Pulmonary:     Effort: Pulmonary effort is normal.     Breath sounds: Normal breath sounds.  Abdominal:     General: Bowel sounds are normal.     Palpations: Abdomen is soft.     Tenderness: There is no abdominal tenderness.  Skin:    Comments: Mole on her cheek that has returned after previous treatment.  Neurological:     Mental Status: She is alert and oriented to person, place, and time.  Psychiatric:        Mood and Affect: Mood normal.        Behavior: Behavior normal.     Diabetic Foot Exam - Simple   Simple Foot Form Diabetic Foot exam was performed with the following findings: Yes 04/22/2023  9:34 AM  Visual Inspection No deformities, no ulcerations,  no other skin breakdown bilaterally: Yes Sensation Testing Intact to touch and monofilament testing bilaterally: Yes Pulse Check Posterior Tibialis and Dorsalis pulse intact bilaterally: Yes Comments      Lab Results  Component Value Date   WBC 7.6 04/22/2023   HGB 12.7 04/22/2023   HCT 40.7 04/22/2023   PLT 212 04/22/2023   GLUCOSE 132 (H) 04/22/2023   CHOL 160 04/22/2023   TRIG 105 04/22/2023   HDL 57 04/22/2023   LDLCALC 84 04/22/2023   ALT 10 04/22/2023   AST 16 04/22/2023   NA 142 04/22/2023    K 5.4 (H) 04/22/2023   CL 103 04/22/2023   CREATININE 0.94 04/22/2023   BUN 16 04/22/2023   CO2 25 04/22/2023   TSH 1.670 04/22/2023   HGBA1C 7.8 (H) 04/22/2023   MICROALBUR 30 07/21/2020      Assessment & Plan:    Combined hyperlipidemia associated with type 2 diabetes mellitus (HCC) Assessment & Plan: Well controlled.  Continue to work on eating a healthy diet and exercise.  Labs drawn today.   No major side effects reported, and no issues with compliance. The current medical regimen is effective;  continue present plan with Zetia  10mg  Will adjust medication as needed depending on labs Lab Results  Component Value Date   LDLCALC 84 04/22/2023     Orders: -     Lipid panel  Hypertension associated with diabetes (HCC) Assessment & Plan: Blood pressure was elevated during today's visit. -Retest blood pressure today. -Advise patient to monitor blood pressure at home and report if consistently above 140/90.  Orders: -     CBC with Differential/Platelet -     Comprehensive metabolic panel -     T4, free -     TSH  Mixed hyperlipidemia -     Lipid panel  Type 2 diabetes mellitus with other specified complication, with long-term current use of insulin  [facility]) Assessment & Plan: Well controlled.  Continue to work on eating a healthy diet and exercise.  Labs drawn today.   No major side effects reported, and no issues with compliance. The current medical regimen is effective;  continue present plan with Tresiba  Will adjust medication as needed depending on labs Lab Results  Component Value Date   HGBA1C 7.8 (H) 04/22/2023   HGBA1C 7.1 (H) 10/20/2022   HGBA1C 6.9 (H) 07/16/2022      Orders: -     Hemoglobin A1c  Age-related osteoporosis without current pathological fracture Assessment & Plan: Continue to monitor for worsening bone pain Will adjust treatment depending on symptoms  Orders: -     T4, free -     TSH  Seasonal allergic rhinitis due to  pollen Assessment & Plan: Reports worsening symptoms. Previous successful treatment with steroid injections. -Administer steroid injection today. -Monitor blood sugars due to potential transient increase from steroid injection.  Orders: -     Triamcinolone  Acetonide  Mixed incontinence Assessment & Plan: Reports frequent urination at night. -Refer to urologist for further evaluation and management.  Orders: -     Ambulatory referral to Urology  Enlarged skin mole Assessment & Plan: Reports recurrence of previously treated skin lesions. -Refer to dermatologist for evaluation and potential treatment of recurrent skin lesions.      Meds ordered this encounter  Medications   triamcinolone  acetonide (KENALOG -40) injection 40 mg    Orders Placed This Encounter  Procedures   CBC with Differential/Platelet   Comprehensive metabolic panel   Hemoglobin A1c  Lipid panel   T4, free   TSH   Ambulatory referral to Urology    General Health Maintenance -Order blood work today. -Plan to follow up in three months. -If patient continues to have issues with blood draw, consider sending to external lab (e.g., [facility]).         Follow-up: Return in about 3 months (around 07/20/2023) for Chronic, Nola.   I,Lauren M Auman,acting as a neurosurgeon for [employer], PA.,have documented all relevant documentation on the behalf of Nola Angles, PA,as directed by  Nola Angles, PA while in the presence of Nola Angles, [place].   An After Visit Summary was printed and given to the patient.  Nola Angles, [place] [facility] [phone]

## 2023-04-22 NOTE — Patient Instructions (Signed)
 VISIT SUMMARY:  During today's visit, we addressed your allergies, urinary issues, skin lesions, and blood pressure. We also discussed your general health maintenance and planned follow-up care.  YOUR PLAN:  -ALLERGIC RHINITIS: Allergic rhinitis is an allergic reaction that causes sneezing, congestion, and a runny nose. We administered a steroid injection today to help manage your symptoms. Please monitor your blood sugar levels as the injection may cause a temporary increase.  -NOCTURIA: Nocturia is a condition where you wake up frequently at night to urinate. We will refer you to a urologist for further evaluation and management.  -SKIN LESIONS: Skin lesions are abnormal growths or appearances on the skin. We will refer you to a dermatologist for evaluation and potential treatment of the recurrent skin lesions.  -HYPERTENSION: Hypertension is high blood pressure. Your blood pressure was elevated today, so we will retest it. Please monitor your blood pressure at home and report if it is consistently above 140/90.  -GENERAL HEALTH MAINTENANCE: We will order blood work today and plan to follow up in three months. If you continue to have issues with the blood draw, we may consider sending you to an external lab like [facility].  INSTRUCTIONS:  Please follow up with the urologist and dermatologist as referred. Monitor your blood pressure at home and report if it is consistently above 140/90. We will follow up in three months to review your blood work and overall health.

## 2023-04-23 LAB — COMPREHENSIVE METABOLIC PANEL
ALT: 10 [IU]/L (ref 0–32)
AST: 16 [IU]/L (ref 0–40)
Albumin: 4 g/dL (ref 3.7–4.7)
Alkaline Phosphatase: 62 [IU]/L (ref 44–121)
BUN/Creatinine Ratio: 17 (ref 12–28)
BUN: 16 mg/dL (ref 8–27)
Bilirubin Total: 0.4 mg/dL (ref 0.0–1.2)
CO2: 25 mmol/L (ref 20–29)
Calcium: 9.6 mg/dL (ref 8.7–10.3)
Chloride: 103 mmol/L (ref 96–106)
Creatinine, Ser: 0.94 mg/dL (ref 0.57–1.00)
Globulin, Total: 2.2 g/dL (ref 1.5–4.5)
Glucose: 132 mg/dL — ABNORMAL HIGH (ref 70–99)
Potassium: 5.4 mmol/L — ABNORMAL HIGH (ref 3.5–5.2)
Sodium: 142 mmol/L (ref 134–144)
Total Protein: 6.2 g/dL (ref 6.0–8.5)
eGFR: 60 mL/min/{1.73_m2} (ref >59)

## 2023-04-23 LAB — LIPID PANEL
Chol/HDL Ratio: 2.8 {ratio} (ref 0.0–4.4)
Cholesterol, Total: 160 mg/dL (ref 100–199)
HDL: 57 mg/dL (ref >39)
LDL Chol Calc (NIH): 84 mg/dL (ref 0–99)
Triglycerides: 105 mg/dL (ref 0–149)
VLDL Cholesterol Cal: 19 mg/dL (ref 5–40)

## 2023-04-23 LAB — CBC WITH DIFFERENTIAL/PLATELET
Basophils Absolute: 0.1 10*3/uL (ref 0.0–0.2)
Basos: 1 %
EOS (ABSOLUTE): 0.2 10*3/uL (ref 0.0–0.4)
Eos: 2 %
Hematocrit: 40.7 % (ref 34.0–46.6)
Hemoglobin: 12.7 g/dL (ref 11.1–15.9)
Immature Grans (Abs): 0 10*3/uL (ref 0.0–0.1)
Immature Granulocytes: 0 %
Lymphocytes Absolute: 2.1 10*3/uL (ref 0.7–3.1)
Lymphs: 28 %
MCH: 28.8 pg (ref 26.6–33.0)
MCHC: 31.2 g/dL — ABNORMAL LOW (ref 31.5–35.7)
MCV: 92 fL (ref 79–97)
Monocytes Absolute: 0.7 10*3/uL (ref 0.1–0.9)
Monocytes: 9 %
Neutrophils Absolute: 4.6 10*3/uL (ref 1.4–7.0)
Neutrophils: 60 %
Platelets: 212 10*3/uL (ref 150–450)
RBC: 4.41 x10E6/uL (ref 3.77–5.28)
RDW: 14.1 % (ref 11.7–15.4)
WBC: 7.6 10*3/uL (ref 3.4–10.8)

## 2023-04-23 LAB — T4, FREE: Free T4: 1.35 ng/dL (ref 0.82–1.77)

## 2023-04-23 LAB — HEMOGLOBIN A1C
Est. average glucose Bld gHb Est-mCnc: 177 mg/dL
Hgb A1c MFr Bld: 7.8 % — ABNORMAL HIGH (ref 4.8–5.6)

## 2023-04-23 LAB — TSH: TSH: 1.67 u[IU]/mL (ref 0.450–4.500)

## 2023-04-26 DIAGNOSIS — D229 Melanocytic nevi, unspecified: Secondary | ICD-10-CM | POA: Insufficient documentation

## 2023-04-26 NOTE — Assessment & Plan Note (Signed)
Well controlled.  Continue to work on eating a healthy diet and exercise.  Labs drawn today.   No major side effects reported, and no issues with compliance. The current medical regimen is effective;  continue present plan with Zetia 10mg  Will adjust medication as needed depending on labs Lab Results  Component Value Date   LDLCALC 84 04/22/2023

## 2023-04-26 NOTE — Assessment & Plan Note (Signed)
Reports recurrence of previously treated skin lesions. -Refer to dermatologist for evaluation and potential treatment of recurrent skin lesions.

## 2023-04-26 NOTE — Assessment & Plan Note (Signed)
Blood pressure was elevated during today's visit. -Retest blood pressure today. -Advise patient to monitor blood pressure at home and report if consistently above 140/90.

## 2023-04-26 NOTE — Assessment & Plan Note (Signed)
Continue to monitor for worsening bone pain Will adjust treatment depending on symptoms

## 2023-04-26 NOTE — Assessment & Plan Note (Signed)
Reports worsening symptoms. Previous successful treatment with steroid injections. -Administer steroid injection today. -Monitor blood sugars due to potential transient increase from steroid injection.

## 2023-04-26 NOTE — Assessment & Plan Note (Signed)
Reports frequent urination at night. -Refer to urologist for further evaluation and management.

## 2023-04-26 NOTE — Assessment & Plan Note (Signed)
Well controlled.  Continue to work on eating a healthy diet and exercise.  Labs drawn today.   No major side effects reported, and no issues with compliance. The current medical regimen is effective;  continue present plan with Rebecca Hodge Will adjust medication as needed depending on labs Lab Results  Component Value Date   HGBA1C 7.8 (H) 04/22/2023   HGBA1C 7.1 (H) 10/20/2022   HGBA1C 6.9 (H) 07/16/2022

## 2023-04-27 ENCOUNTER — Telehealth: Payer: Self-pay

## 2023-04-27 ENCOUNTER — Other Ambulatory Visit: Payer: Self-pay | Admitting: Physician Assistant

## 2023-04-27 ENCOUNTER — Other Ambulatory Visit: Payer: Self-pay

## 2023-04-27 DIAGNOSIS — N952 Postmenopausal atrophic vaginitis: Secondary | ICD-10-CM

## 2023-04-27 NOTE — Telephone Encounter (Signed)
Copied from CRM [id number]. Topic: Referral - Status >> Apr 27, 2023  9:29 AM Geroge Baseman wrote: Reason for CRM: Patient daughted called in to state the facility her mom wants to go to is, [phone], Dr Alison Murray, [facility]  [place]. OBGYN.

## 2023-04-27 NOTE — Progress Notes (Signed)
refer

## 2023-04-27 NOTE — Telephone Encounter (Signed)
Spoke with Patient's daughter, Gigi Gin, She stated she will find out the name of the facility and the Dr. She stated she would call us back with this info.

## 2023-04-27 NOTE — Telephone Encounter (Signed)
-----   Message from Langley Gauss sent at 04/26/2023  8:51 PM EST ----- Regarding: OBGYN Patient had requested a referral to OBGYN. Her daughter mentioned that she likes the place that she goes to, but I did not set the referral up. Can we call and see where she would like to go for the referral?

## 2023-04-29 DIAGNOSIS — R911 Solitary pulmonary nodule: Secondary | ICD-10-CM | POA: Diagnosis not present

## 2023-05-02 DIAGNOSIS — R911 Solitary pulmonary nodule: Secondary | ICD-10-CM | POA: Diagnosis not present

## 2023-05-02 DIAGNOSIS — I3139 Other pericardial effusion (noninflammatory): Secondary | ICD-10-CM | POA: Diagnosis not present

## 2023-05-02 DIAGNOSIS — J439 Emphysema, unspecified: Secondary | ICD-10-CM | POA: Diagnosis not present

## 2023-05-05 ENCOUNTER — Other Ambulatory Visit: Payer: Self-pay | Admitting: Physician Assistant

## 2023-05-05 DIAGNOSIS — M810 Age-related osteoporosis without current pathological fracture: Secondary | ICD-10-CM

## 2023-05-05 DIAGNOSIS — J302 Other seasonal allergic rhinitis: Secondary | ICD-10-CM

## 2023-05-05 DIAGNOSIS — E1159 Type 2 diabetes mellitus with other circulatory complications: Secondary | ICD-10-CM

## 2023-05-05 DIAGNOSIS — N952 Postmenopausal atrophic vaginitis: Secondary | ICD-10-CM

## 2023-05-05 DIAGNOSIS — I152 Hypertension secondary to endocrine disorders: Secondary | ICD-10-CM

## 2023-05-05 DIAGNOSIS — R1084 Generalized abdominal pain: Secondary | ICD-10-CM

## 2023-05-05 DIAGNOSIS — E782 Mixed hyperlipidemia: Secondary | ICD-10-CM

## 2023-05-05 DIAGNOSIS — Z794 Long term (current) use of insulin: Secondary | ICD-10-CM

## 2023-05-05 DIAGNOSIS — E1169 Type 2 diabetes mellitus with other specified complication: Secondary | ICD-10-CM

## 2023-05-05 DIAGNOSIS — E559 Vitamin D deficiency, unspecified: Secondary | ICD-10-CM

## 2023-05-05 NOTE — Telephone Encounter (Signed)
Copied from CRM [id number]. Topic: Clinical - Medication Refill >> May 05, 2023 10:08 AM Herbert Seta B wrote: Most Recent Primary Care Visit:  Provider: CRAFT, BRADY  Department: [facility]  Visit Type: OFFICE VISIT  Date: 04/22/2023  Medication: 1-alendronate (FOSAMAX) 70 MG tablet  2-ezetimibe (ZETIA) 10 MG tablet  3-fluticasone (FLONASE) 50 MCG/ACT nasal spray  4-gabapentin (NEURONTIN) 300 MG capsule  5-liraglutide (VICTOZA) 18 MG/3ML SOPN  6-lisinopril (ZESTRIL) 20 MG tablet  7-meloxicam (MOBIC) 15 MG tablet  8-metoprolol tartrate (LOPRESSOR) 25 MG tablet  9-mirtazapine (REMERON) 15 MG tablet  10-pantoprazole (PROTONIX) 40 MG tablet  11-rosuvastatin (CRESTOR) 40 MG tablet  12-Vitamin D, Ergocalciferol, (DRISDOL) 1.25 MG (50000 UNIT) CAPS capsule   Has the patient contacted their pharmacy? Yes-no refills (Agent: If no, request that the patient contact the pharmacy for the refill. If patient does not wish to contact the pharmacy document the reason why and proceed with request.) (Agent: If yes, when and what did the pharmacy advise?)  Is this the correct pharmacy for this prescription? Yes If no, delete pharmacy and type the correct one.  This is the patient's preferred pharmacy:  [id number], [place] - [address] [place]  [id number] Phone: [phone] Fax: [phone]   Has the prescription been filled recently? no  Is the patient out of the medication? yes  Has the patient been seen for an appointment in the last year OR does the patient have an upcoming appointment? yes  Can we respond through MyChart? yes  Agent: Please be advised that Rx refills may take up to 3 business days. We ask that you follow-up with your pharmacy.

## 2023-05-08 MED ORDER — PANTOPRAZOLE SODIUM 40 MG PO TBEC: 40.0000 mg | DELAYED_RELEASE_TABLET | Freq: Every day | ORAL | 2 refills | Status: DC

## 2023-05-08 MED ORDER — LISINOPRIL 20 MG PO TABS: 20.0000 mg | ORAL_TABLET | Freq: Every day | ORAL | 1 refills | Status: DC

## 2023-05-08 MED ORDER — EZETIMIBE 10 MG PO TABS: 10.0000 mg | ORAL_TABLET | Freq: Every day | ORAL | 3 refills | Status: DC

## 2023-05-08 MED ORDER — FLUTICASONE PROPIONATE 50 MCG/ACT NA SUSP: 2.0000 | Freq: Every day | NASAL | 6 refills | Status: DC

## 2023-05-08 MED ORDER — LIRAGLUTIDE 18 MG/3ML ~~LOC~~ SOPN: 1.2000 mg | PEN_INJECTOR | Freq: Every day | SUBCUTANEOUS | 1 refills | Status: DC

## 2023-05-08 MED ORDER — VITAMIN D (ERGOCALCIFEROL) 1.25 MG (50000 UNIT) PO CAPS: 50000.0000 [IU] | ORAL_CAPSULE | ORAL | 6 refills | Status: DC

## 2023-05-08 MED ORDER — ROSUVASTATIN CALCIUM 40 MG PO TABS: 40.0000 mg | ORAL_TABLET | Freq: Every day | ORAL | 2 refills | Status: DC

## 2023-05-08 MED ORDER — ALENDRONATE SODIUM 70 MG PO TABS
70.0000 mg | ORAL_TABLET | ORAL | 2 refills | Status: DC
Start: 2023-05-08 — End: 2023-10-04

## 2023-05-08 MED ORDER — METOPROLOL TARTRATE 25 MG PO TABS
25.0000 mg | ORAL_TABLET | Freq: Two times a day (BID) | ORAL | 2 refills | Status: DC
Start: 2023-05-08 — End: 2023-10-24

## 2023-05-08 MED ORDER — GABAPENTIN 300 MG PO CAPS: 300.0000 mg | ORAL_CAPSULE | Freq: Three times a day (TID) | ORAL | 2 refills | Status: DC

## 2023-05-08 MED ORDER — ESTRADIOL 0.1 MG/GM VA CREA: TOPICAL_CREAM | VAGINAL | 3 refills | Status: AC

## 2023-05-08 MED ORDER — MIRTAZAPINE 15 MG PO TABS: 15.0000 mg | ORAL_TABLET | Freq: Every day | ORAL | 1 refills | Status: DC

## 2023-05-10 ENCOUNTER — Other Ambulatory Visit: Payer: Self-pay

## 2023-05-10 DIAGNOSIS — R911 Solitary pulmonary nodule: Secondary | ICD-10-CM

## 2023-05-14 DIAGNOSIS — E119 Type 2 diabetes mellitus without complications: Secondary | ICD-10-CM | POA: Diagnosis not present

## 2023-05-14 DIAGNOSIS — R32 Unspecified urinary incontinence: Secondary | ICD-10-CM | POA: Diagnosis not present

## 2023-05-25 ENCOUNTER — Encounter: Payer: Self-pay | Admitting: Physician Assistant

## 2023-05-25 ENCOUNTER — Ambulatory Visit: Payer: Self-pay | Admitting: Physician Assistant

## 2023-05-25 ENCOUNTER — Ambulatory Visit: Admitting: Physician Assistant

## 2023-05-25 VITALS — BP 98/62 | HR 59 | Temp 97.6°F | Ht 62.0 in | Wt 122.8 lb

## 2023-05-25 DIAGNOSIS — B999 Unspecified infectious disease: Secondary | ICD-10-CM

## 2023-05-25 DIAGNOSIS — J301 Allergic rhinitis due to pollen: Secondary | ICD-10-CM

## 2023-05-25 DIAGNOSIS — H6120 Impacted cerumen, unspecified ear: Secondary | ICD-10-CM | POA: Insufficient documentation

## 2023-05-25 DIAGNOSIS — H6122 Impacted cerumen, left ear: Secondary | ICD-10-CM

## 2023-05-25 MED ORDER — BENZONATATE 200 MG PO CAPS
200.0000 mg | ORAL_CAPSULE | Freq: Two times a day (BID) | ORAL | 0 refills | Status: DC | PRN
Start: 2023-05-25 — End: 2023-08-05

## 2023-05-25 MED ORDER — AZITHROMYCIN 250 MG PO TABS: ORAL_TABLET | ORAL | 0 refills | Status: AC

## 2023-05-25 MED ORDER — LORATADINE 10 MG PO TABS: 10.0000 mg | ORAL_TABLET | Freq: Every day | ORAL | 3 refills | Status: DC

## 2023-05-25 NOTE — Assessment & Plan Note (Signed)
 Cerumen impaction in the left ear identified. Requires cleaning due to earwax buildup. - Attempt to clean the left ear if staff is available. - Advise use of over-the-counter earwax removal products if cleaning is not possible in the office.

## 2023-05-25 NOTE — Progress Notes (Signed)
 Acute Office Visit  Subjective:    Patient ID: Rebecca Hodge, female    DOB: 12/07/1940, 83 y.o.   MRN: [id number]  Chief Complaint  Patient presents with   Sinusitis      HPI: Patient is in today for sinus congestion. Patient states that it has been going on for several weeks. Patient complains of sinus drainage, congestion, headache, and cough.  Discussed the use of AI scribe software for clinical note transcription with the patient, who gave verbal consent to proceed.  History of Present Illness   The patient presents with a chronic sinus infection, characterized by nasal congestion, postnasal drip, and a productive cough. The symptoms are localized to the nasal passages, throat, and chest. The patient reports a sore throat and a 5-pound weight fluctuation. She denies fever and exposure to sick contacts. The patient's symptoms have not improved with over-the-counter medications or a previous Kenalog injection. The patient also reports a history of headaches associated with the sinus infection and lightheadedness with coughing. The patient has a history of diabetes, which is well-controlled, and no medication refills are needed at this time. The patient also reports a wax buildup in the left ear.      Past Medical History:  Diagnosis Date   Age-related osteoporosis without current pathological fracture 06/18/2019   Allergic rhinitis    BMI 27.0-27.9,adult 08/17/2019   Diabetes (HCC)    Dysphasia following cerebral infarction 06/18/2019   Hyperlipidemia    Hypertension    Mixed hyperlipidemia 06/18/2019   Mixed incontinence 06/18/2019   Sequelae of poliomyelitis 06/18/2019   Type 2 diabetes mellitus with other specified complication (HCC) 06/18/2019    Past Surgical History:  Procedure Laterality Date   ABDOMINAL HYSTERECTOMY     CATARACT EXTRACTION Bilateral    CHOLECYSTECTOMY     ROTATOR CUFF REPAIR Bilateral     History reviewed. No pertinent family history.  Social History    Socioeconomic History   Marital status: Widowed    Spouse name: Not on file   Number of children: Not on file   Years of education: Not on file   Highest education level: 8th grade  Occupational History   Occupation: Disabled  Tobacco Use   Smoking status: Former    Current packs/day: 0.00    Average packs/day: 1 pack/day for 5.0 years (5.0 ttl pk-yrs)    Types: Cigarettes    Start date: 03/15/1982    Quit date: 03/16/1987    Years since quitting: 36.2   Smokeless tobacco: Never  Vaping Use   Vaping status: Never Used  Substance and Sexual Activity   Alcohol use: No   Drug use: No   Sexual activity: Not Currently  Other Topics Concern   Not on file  Social History Narrative   Not on file   Social Drivers of Health   Financial Resource Strain: Low Risk  (09/07/2022)   Overall Financial Resource Strain (CARDIA)    Difficulty of Paying Living Expenses: Not hard at all  Food Insecurity: No Food Insecurity (09/07/2022)   Hunger Vital Sign    Worried About Running Out of Food in the Last Year: Never true    Ran Out of Food in the Last Year: Never true  Transportation Needs: No Transportation Needs (09/07/2022)   PRAPARE - Administrator, Civil Service (Medical): No    Lack of Transportation (Non-Medical): No  Physical Activity: Sufficiently Active (09/07/2022)   Exercise Vital Sign    Days of  Exercise per Week: 5 days    Minutes of Exercise per Session: 30 min  Stress: No Stress Concern Present (09/07/2022)   [employer] of Occupational Health - Occupational Stress Questionnaire    Feeling of Stress : Not at all  Social Connections: Moderately Isolated (09/07/2022)   Social Connection and Isolation Panel [NHANES]    Frequency of Communication with Friends and Family: Three times a week    Frequency of Social Gatherings with Friends and Family: Twice a week    Attends Religious Services: More than 4 times per year    Active Member of [employer] or Organizations: No     Attends Banker Meetings: Never    Marital Status: Widowed  Intimate Partner Violence: Not At Risk (09/07/2022)   Humiliation, Afraid, Rape, and Kick questionnaire    Fear of Current or Ex-Partner: No    Emotionally Abused: No    Physically Abused: No    Sexually Abused: No    Outpatient Medications Prior to Visit  Medication Sig Dispense Refill   alendronate (FOSAMAX) 70 MG tablet Take 1 tablet (70 mg total) by mouth once a week. 6 tablet 2   BD PEN NEEDLE NANO U/F 32G X 4 MM MISC Use new needle with each injection 50 each 1   Blood Glucose Monitoring Suppl (ACCU-CHEK GUIDE) w/Device KIT 1 each by Other route daily. 1 kit 0   estradiol (ESTRACE) 0.1 MG/GM vaginal cream INSERT 1 APPLICATORFUL VAGINALLY 3 TIMES A WEEK 42.5 g 3   ezetimibe (ZETIA) 10 MG tablet Take 1 tablet (10 mg total) by mouth daily. 90 tablet 3   fluticasone (FLONASE) 50 MCG/ACT nasal spray Place 2 sprays into both nostrils daily. 16 g 6   gabapentin (NEURONTIN) 300 MG capsule Take 1 capsule (300 mg total) by mouth 3 (three) times daily. 270 capsule 2   glucose blood (ACCU-CHEK GUIDE TEST) test strip Check FBS daily 100 each 12   insulin degludec (TRESIBA FLEXTOUCH) 200 UNIT/ML FlexTouch Pen Inject 12 Units into the skin at bedtime. 6 mL 0   Lancets (ONETOUCH ULTRASOFT) lancets 1 each by Other route daily. Use as instructed 100 each 2   liraglutide (VICTOZA) 18 MG/3ML SOPN Inject 1.2 mg into the skin daily. 9 mL 1   lisinopril (ZESTRIL) 20 MG tablet Take 1 tablet (20 mg total) by mouth daily. 90 tablet 1   meloxicam (MOBIC) 15 MG tablet Take 1 tablet (15 mg total) by mouth daily. 30 tablet 0   metoprolol tartrate (LOPRESSOR) 25 MG tablet Take 1 tablet (25 mg total) by mouth 2 (two) times daily. 180 tablet 2   mirtazapine (REMERON) 15 MG tablet Take 1 tablet (15 mg total) by mouth at bedtime. 90 tablet 1   mupirocin ointment (BACTROBAN) 2 % Apply 1 Application topically 2 (two) times daily. 22 g 2    pantoprazole (PROTONIX) 40 MG tablet Take 1 tablet (40 mg total) by mouth daily. 90 tablet 2   rosuvastatin (CRESTOR) 40 MG tablet Take 1 tablet (40 mg total) by mouth at bedtime. 90 tablet 2   Vitamin D, Ergocalciferol, (DRISDOL) 1.25 MG (50000 UNIT) CAPS capsule Take 1 capsule (50,000 Units total) by mouth every 7 (seven) days. 12 capsule 6   No facility-administered medications prior to visit.    Allergies  Allergen Reactions   Codeine    Morphine And Codeine    Cipro [Ciprofloxacin Hcl] Nausea Only    Patient can't not tolerate 500 mg   Lantus [Insulin  Glargine] Itching   Sulfa Antibiotics Itching    Review of Systems  Constitutional:  Negative for appetite change, fatigue and fever.  HENT:  Positive for congestion and sinus pressure. Negative for ear pain and sore throat.   Respiratory:  Positive for cough. Negative for chest tightness, shortness of breath and wheezing.   Cardiovascular:  Negative for chest pain and palpitations.  Gastrointestinal:  Negative for abdominal pain, constipation, diarrhea, nausea and vomiting.  Genitourinary:  Negative for dysuria and hematuria.  Musculoskeletal:  Negative for arthralgias, back pain, joint swelling and myalgias.  Skin:  Negative for rash.  Neurological:  Negative for dizziness, weakness and headaches.  Psychiatric/Behavioral:  Negative for dysphoric mood. The patient is not nervous/anxious.        Objective:        05/25/2023    1:15 PM 04/22/2023   10:24 AM 04/22/2023    9:04 AM  Vitals with BMI  Height 5\' 2"   5\' 2"   Weight 122 lbs 13 oz  127 lbs  BMI 22.45  23.22  Systolic 98 100 198  Diastolic 62 60 52  Pulse 59  54    Orthostatic VS for the past 72 hrs (Last 3 readings):  Patient Position BP Location  05/25/23 1315 Sitting Left Arm     Physical Exam Vitals reviewed.  Constitutional:      Appearance: Normal appearance.  HENT:     Head: Normocephalic.     Right Ear: Hearing and tympanic membrane normal.      Left Ear: Hearing normal. No drainage, swelling or tenderness. There is impacted cerumen.     Nose:     Right Sinus: Maxillary sinus tenderness and frontal sinus tenderness present.     Left Sinus: Maxillary sinus tenderness and frontal sinus tenderness present.     Mouth/Throat:     Mouth: Mucous membranes are moist.     Tongue: No lesions. Tongue does not deviate from midline.     Palate: No mass and lesions.     Pharynx: Oropharynx is clear. Posterior oropharyngeal erythema present.  Cardiovascular:     Rate and Rhythm: Normal rate and regular rhythm.     Heart sounds: Normal heart sounds.  Pulmonary:     Effort: Pulmonary effort is normal.     Breath sounds: Normal breath sounds.  Abdominal:     General: Bowel sounds are normal.     Palpations: Abdomen is soft.     Tenderness: There is no abdominal tenderness.  Neurological:     Mental Status: She is alert and oriented to person, place, and time.  Psychiatric:        Mood and Affect: Mood normal.        Behavior: Behavior normal.     Health Maintenance Due  Topic Date Due   DTaP/Tdap/Td (1 - Tdap) Never done   OPHTHALMOLOGY EXAM  03/19/2021   INFLUENZA VACCINE  10/14/2022    There are no preventive care reminders to display for this patient.   Lab Results  Component Value Date   TSH 1.670 04/22/2023   Lab Results  Component Value Date   WBC 7.6 04/22/2023   HGB 12.7 04/22/2023   HCT 40.7 04/22/2023   MCV 92 04/22/2023   PLT 212 04/22/2023   Lab Results  Component Value Date   NA 142 04/22/2023   K 5.4 (H) 04/22/2023   CO2 25 04/22/2023   GLUCOSE 132 (H) 04/22/2023   BUN 16 04/22/2023   CREATININE 0.94  04/22/2023   BILITOT 0.4 04/22/2023   ALKPHOS 62 04/22/2023   AST 16 04/22/2023   ALT 10 04/22/2023   PROT 6.2 04/22/2023   ALBUMIN 4.0 04/22/2023   CALCIUM 9.6 04/22/2023   EGFR 60 04/22/2023   Lab Results  Component Value Date   CHOL 160 04/22/2023   Lab Results  Component Value Date   HDL 57  04/22/2023   Lab Results  Component Value Date   LDLCALC 84 04/22/2023   Lab Results  Component Value Date   TRIG 105 04/22/2023   Lab Results  Component Value Date   CHOLHDL 2.8 04/22/2023   Lab Results  Component Value Date   HGBA1C 7.8 (H) 04/22/2023   Ear Cerumen Removal  Date/Time: 05/25/2023 1:36 PM  Performed by: Langley Gauss, PA Authorized by: Langley Gauss, PA   Anesthesia: Local Anesthetic: none Ceruminolytics applied: Ceruminolytics applied prior to the procedure. Location details: left ear Patient tolerance: patient tolerated the procedure well with no immediate complications Procedure type: irrigation  Sedation: Patient sedated: no         Assessment & Plan:  Acute infection Assessment & Plan: Chronic Sinusitis Chronic sinusitis with nasal discharge, sore throat, and cough. Persistent symptoms despite previous Kenalog injection. Suspected bacterial sinus infection. Lack of regular allergy medication use may contribute to symptoms. Discussed potential need for extended treatment duration if symptoms persist. - Prescribe antibiotics for sinus infection. - Prescribe Tessalon Perles for cough. - Prescribe loratadine for allergies.  Orders: -     Benzonatate; Take 1 capsule (200 mg total) by mouth 2 (two) times daily as needed for cough.  Dispense: 20 capsule; Refill: 0 -     Azithromycin; Take 2 tablets on day 1, then 1 tablet daily on days 2 through 5  Dispense: 6 tablet; Refill: 0  Seasonal allergic rhinitis due to pollen Assessment & Plan: Did not like taking flonase Will try Claritin  Will let me know if anything changes or gets any worse  Orders: -     Loratadine; Take 1 tablet (10 mg total) by mouth daily.  Dispense: 30 tablet; Refill: 3  Impacted cerumen of left ear Assessment & Plan: Cerumen impaction in the left ear identified. Requires cleaning due to earwax buildup. - Attempt to clean the left ear if staff is available. - Advise use of  over-the-counter earwax removal products if cleaning is not possible in the office.       Other orders -     Ear Cerumen Removal     Assessment and Plan       Meds ordered this encounter  Medications   benzonatate (TESSALON) 200 MG capsule    Sig: Take 1 capsule (200 mg total) by mouth 2 (two) times daily as needed for cough.    Dispense:  20 capsule    Refill:  0   loratadine (CLARITIN) 10 MG tablet    Sig: Take 1 tablet (10 mg total) by mouth daily.    Dispense:  30 tablet    Refill:  3   azithromycin (ZITHROMAX) 250 MG tablet    Sig: Take 2 tablets on day 1, then 1 tablet daily on days 2 through 5    Dispense:  6 tablet    Refill:  0    Orders Placed This Encounter  Procedures   Ear Cerumen Removal    Assessment and Plan     Follow-up: Return if symptoms worsen or fail to improve, for [facility].  An After Visit  Summary was printed and given to the patient.   I,Lauren M Auman,acting as a Neurosurgeon for [employer], PA.,have documented all relevant documentation on the behalf of Langley Gauss, PA,as directed by  Langley Gauss, PA while in the presence of Langley Gauss, [place].    Langley Gauss, [place] [facility] [phone]

## 2023-05-25 NOTE — Assessment & Plan Note (Signed)
 Did not like taking flonase Will try Claritin  Will let me know if anything changes or gets any worse

## 2023-05-25 NOTE — Assessment & Plan Note (Signed)
 Chronic Sinusitis Chronic sinusitis with nasal discharge, sore throat, and cough. Persistent symptoms despite previous Kenalog injection. Suspected bacterial sinus infection. Lack of regular allergy medication use may contribute to symptoms. Discussed potential need for extended treatment duration if symptoms persist. - Prescribe antibiotics for sinus infection. - Prescribe Tessalon Perles for cough. - Prescribe loratadine for allergies.

## 2023-05-25 NOTE — Patient Instructions (Signed)
 VISIT SUMMARY:  During today's visit, we discussed your ongoing sinus infection, which has not improved with previous treatments. We also addressed your earwax buildup in the left ear.  YOUR PLAN:  -CHRONIC SINUSITIS: Chronic sinusitis is a long-lasting inflammation of the sinuses, often due to infection. We suspect a bacterial infection and have prescribed antibiotics to help clear it. Additionally, we have prescribed Tessalon Perles to help manage your cough and loratadine for your allergies. If your symptoms persist, we may need to consider a longer treatment duration.  -CERUMEN IMPACTION: Cerumen impaction is a buildup of earwax that can block the ear canal. We will attempt to clean your left ear in the office if staff is available. If not, we recommend using over-the-counter earwax removal products at home.  INSTRUCTIONS:  Please follow the prescribed medication regimen and monitor your symptoms. If your sinus infection does not improve or if you experience any new symptoms, please schedule a follow-up appointment. Additionally, if we were unable to clean your ear in the office, use the recommended over-the-counter earwax removal products.

## 2023-05-25 NOTE — Telephone Encounter (Signed)
 Copied from CRM [id number]. Topic: Clinical - Red Word Triage >> May 25, 2023  8:21 AM Elle L wrote: Red Word that prompted transfer to Nurse Triage: Lin Landsman, the patient's daughter, states that the patient was seen on 2/7 and is not feeling better. She has a sinus infection, pressure, and pain and increased mucus.  Chief Complaint: sinus pressure, cough, sore throat, pain, congestion Symptoms: see above Frequency: since Feb.  Pertinent Negatives: Patient denies cp, sob Disposition: [] ED /[] Urgent Care (no appt availability in office) / [x] Appointment(In office/virtual)/ []  [place] [facility]/ [] Home Care/ [] Refused Recommended Disposition /[] [place] [facility]/ []  Follow-up with PCP Additional Notes: per protocol apt made for today; care advice given, denies questions; instructed to go to ER if becomes worse.   Reason for Disposition  [1] SEVERE pain AND [2] not improved 2 hours after pain medicine  Answer Assessment - Initial Assessment Questions 1. LOCATION: "Where does it hurt?"      Face, head, chest with congestion 2. ONSET: "When did the sinus pain start?"  (e.g., hours, days)      Feb 7, was not given an antibiotic; given steroid shot 3. SEVERITY: "How bad is the pain?"   (Scale 1-10; mild, moderate or severe)   - MILD (1-3): doesn't interfere with normal activities    - MODERATE (4-7): interferes with normal activities (e.g., work or school) or awakens from sleep   - SEVERE (8-10): excruciating pain and patient unable to do any normal activities        6/10 4. RECURRENT SYMPTOM: "Have you ever had sinus problems before?" If Yes, ask: "When was the last time?" and "What happened that time?"      yes 5. NASAL CONGESTION: "Is the nose blocked?" If Yes, ask: "Can you open it or must you breathe through your mouth?"     congestion 6. NASAL DISCHARGE: "Do you have discharge from your nose?" If so ask, "What color?"     Thick mucous; brown 7. FEVER: "Do you have a fever?"  If Yes, ask: "What is it, how was it measured, and when did it start?"      denies 8. OTHER SYMPTOMS: "Do you have any other symptoms?" (e.g., sore throat, cough, earache, difficulty breathing)     Cough, sore throat 9. PREGNANCY: "Is there any chance you are pregnant?" "When was your last menstrual period?"     na  Protocols used: Sinus Pain or Congestion-A-AH

## 2023-05-26 ENCOUNTER — Other Ambulatory Visit: Payer: Self-pay

## 2023-05-26 MED ORDER — LIRAGLUTIDE 18 MG/3ML ~~LOC~~ SOPN: 1.2000 mg | PEN_INJECTOR | Freq: Every day | SUBCUTANEOUS | 1 refills | Status: DC

## 2023-07-20 NOTE — Progress Notes (Unsigned)
 Subjective:  Patient ID: Rebecca Hodge, female    DOB: 1940/07/29  Age: 83 y.o. MRN: [id number]  No chief complaint on file.   HPI:     04/22/2023    9:20 AM 10/20/2022    8:22 AM 09/07/2022    9:23 AM 07/08/2022    8:51 AM 11/03/2021    2:24 PM  Depression screen PHQ 2/9  Decreased Interest 0 0 0 0 0  Down, Depressed, Hopeless 0 0 0 0 0  PHQ - 2 Score 0 0 0 0 0  Altered sleeping 0 0  0 0  Tired, decreased energy 0 0  0 0  Change in appetite 0 0  0 0  Feeling bad or failure about yourself  0 0  0 0  Trouble concentrating 0 0  0 0  Moving slowly or fidgety/restless 0 0  0 0  Suicidal thoughts 0 0  0 0  PHQ-9 Score 0 0  0 0  Difficult doing work/chores Not difficult at all Not difficult at all  Not difficult at all         04/22/2023    9:11 AM  Fall Risk   Falls in the past year? 0  Number falls in past yr: 0  Injury with Fall? 0  Risk for fall due to : No Fall Risks  Follow up Falls evaluation completed    Patient Care Team: Odilia Bennett, [place] as PCP - General (Physician Assistant) Devon Fogo, [facility] (Inactive) as Pharmacist (Pharmacist)   Review of Systems  Constitutional:  Negative for appetite change, fatigue and fever.  HENT:  Negative for congestion, ear pain, sinus pressure and sore throat.   Respiratory:  Negative for cough, chest tightness, shortness of breath and wheezing.   Cardiovascular:  Negative for chest pain and palpitations.  Gastrointestinal:  Negative for abdominal pain, constipation, diarrhea, nausea and vomiting.  Genitourinary:  Negative for dysuria and hematuria.  Musculoskeletal:  Negative for arthralgias, back pain, joint swelling and myalgias.  Skin:  Negative for rash.  Neurological:  Negative for dizziness, weakness and headaches.  Psychiatric/Behavioral:  Negative for dysphoric mood. The patient is not nervous/anxious.     Current Outpatient Medications on File Prior to Visit  Medication Sig Dispense Refill   alendronate  (FOSAMAX )  70 MG tablet Take 1 tablet (70 mg total) by mouth once a week. 6 tablet 2   BD PEN NEEDLE NANO U/F 32G X 4 MM MISC Use new needle with each injection 50 each 1   benzonatate  (TESSALON ) 200 MG capsule Take 1 capsule (200 mg total) by mouth 2 (two) times daily as needed for cough. 20 capsule 0   Blood Glucose Monitoring Suppl (ACCU-CHEK GUIDE) w/Device KIT 1 each by Other route daily. 1 kit 0   estradiol  (ESTRACE ) 0.1 MG/GM vaginal cream INSERT 1 APPLICATORFUL VAGINALLY 3 TIMES A WEEK 42.5 g 3   ezetimibe  (ZETIA ) 10 MG tablet Take 1 tablet (10 mg total) by mouth daily. 90 tablet 3   fluticasone  (FLONASE ) 50 MCG/ACT nasal spray Place 2 sprays into both nostrils daily. 16 g 6   gabapentin  (NEURONTIN ) 300 MG capsule Take 1 capsule (300 mg total) by mouth 3 (three) times daily. 270 capsule 2   glucose blood (ACCU-CHEK GUIDE TEST) test strip Check FBS daily 100 each 12   Lancets (ONETOUCH ULTRASOFT) lancets 1 each by Other route daily. Use as instructed 100 each 2   liraglutide  (VICTOZA ) 18 MG/3ML SOPN Inject 1.2 mg into the  skin daily. 9 mL 1   lisinopril  (ZESTRIL ) 20 MG tablet Take 1 tablet (20 mg total) by mouth daily. 90 tablet 1   loratadine  (CLARITIN ) 10 MG tablet Take 1 tablet (10 mg total) by mouth daily. 30 tablet 3   meloxicam  (MOBIC ) 15 MG tablet Take 1 tablet (15 mg total) by mouth daily. 30 tablet 0   metoprolol  tartrate (LOPRESSOR ) 25 MG tablet Take 1 tablet (25 mg total) by mouth 2 (two) times daily. 180 tablet 2   mirtazapine  (REMERON ) 15 MG tablet Take 1 tablet (15 mg total) by mouth at bedtime. 90 tablet 1   mupirocin  ointment (BACTROBAN ) 2 % Apply 1 Application topically 2 (two) times daily. 22 g 2   pantoprazole  (PROTONIX ) 40 MG tablet Take 1 tablet (40 mg total) by mouth daily. 90 tablet 2   rosuvastatin  (CRESTOR ) 40 MG tablet Take 1 tablet (40 mg total) by mouth at bedtime. 90 tablet 2   Vitamin D , Ergocalciferol , (DRISDOL ) 1.25 MG (50000 UNIT) CAPS capsule Take 1 capsule (50,000  Units total) by mouth every 7 (seven) days. 12 capsule 6   No current facility-administered medications on file prior to visit.   Past Medical History:  Diagnosis Date   Age-related osteoporosis without current pathological fracture 06/18/2019   Allergic rhinitis    BMI 27.0-27.9,adult 08/17/2019   Diabetes (HCC)    Dysphasia following cerebral infarction 06/18/2019   Hyperlipidemia    Hypertension    Mixed hyperlipidemia 06/18/2019   Mixed incontinence 06/18/2019   Sequelae of poliomyelitis 06/18/2019   Type 2 diabetes mellitus with other specified complication (HCC) 06/18/2019   Past Surgical History:  Procedure Laterality Date   ABDOMINAL HYSTERECTOMY     CATARACT EXTRACTION Bilateral    CHOLECYSTECTOMY     ROTATOR CUFF REPAIR Bilateral     No family history on file. Social History   Socioeconomic History   Marital status: Widowed    Spouse name: Not on file   Number of children: Not on file   Years of education: Not on file   Highest education level: 8th grade  Occupational History   Occupation: Disabled  Tobacco Use   Smoking status: Former    Current packs/day: 0.00    Average packs/day: 1 pack/day for 5.0 years (5.0 ttl pk-yrs)    Types: Cigarettes    Start date: 03/15/1982    Quit date: 03/16/1987    Years since quitting: 36.3   Smokeless tobacco: Never  Vaping Use   Vaping status: Never Used  Substance and Sexual Activity   Alcohol use: No   Drug use: No   Sexual activity: Not Currently  Other Topics Concern   Not on file  Social History Narrative   Not on file   Social Drivers of Health   Financial Resource Strain: Low Risk  (09/07/2022)   Overall Financial Resource Strain (CARDIA)    Difficulty of Paying Living Expenses: Not hard at all  Food Insecurity: No Food Insecurity (09/07/2022)   Hunger Vital Sign    Worried About Running Out of Food in the Last Year: Never true    Ran Out of Food in the Last Year: Never true  Transportation Needs: No Transportation  Needs (09/07/2022)   PRAPARE - Administrator, Civil Service (Medical): No    Lack of Transportation (Non-Medical): No  Physical Activity: Sufficiently Active (09/07/2022)   Exercise Vital Sign    Days of Exercise per Week: 5 days    Minutes of Exercise  per Session: 30 min  Stress: No Stress Concern Present (09/07/2022)   [employer] of Occupational Health - Occupational Stress Questionnaire    Feeling of Stress : Not at all  Social Connections: Moderately Isolated (09/07/2022)   Social Connection and Isolation Panel [NHANES]    Frequency of Communication with Friends and Family: Three times a week    Frequency of Social Gatherings with Friends and Family: Twice a week    Attends Religious Services: More than 4 times per year    Active Member of [employer] or Organizations: No    Attends Banker Meetings: Never    Marital Status: Widowed    Objective:  LMP  (LMP Unknown)      05/25/2023    1:15 PM 04/22/2023   10:24 AM 04/22/2023    9:04 AM  BP/Weight  Systolic BP 98 100 198  Diastolic BP 62 60 52  Wt. (Lbs) 122.8  127  BMI 22.46 kg/m2  23.23 kg/m2    Physical Exam  Diabetic Foot Exam - Simple   No data filed      Lab Results  Component Value Date   WBC 7.6 04/22/2023   HGB 12.7 04/22/2023   HCT 40.7 04/22/2023   PLT 212 04/22/2023   GLUCOSE 132 (H) 04/22/2023   CHOL 160 04/22/2023   TRIG 105 04/22/2023   HDL 57 04/22/2023   LDLCALC 84 04/22/2023   ALT 10 04/22/2023   AST 16 04/22/2023   NA 142 04/22/2023   K 5.4 (H) 04/22/2023   CL 103 04/22/2023   CREATININE 0.94 04/22/2023   BUN 16 04/22/2023   CO2 25 04/22/2023   TSH 1.670 04/22/2023   HGBA1C 7.8 (H) 04/22/2023   MICROALBUR 30 07/21/2020      Assessment & Plan:  There are no diagnoses linked to this encounter.   No orders of the defined types were placed in this encounter.   No orders of the defined types were placed in this encounter.    Follow-up: No follow-ups on  file.   I,Lauren M Auman,acting as a Neurosurgeon for [employer], [employer].,have documented all relevant documentation on the behalf of Odilia Bennett, PA,as directed by  Odilia Bennett, PA while in the presence of Odilia Bennett, [place].   An After Visit Summary was printed and given to the patient.  Odilia Bennett, [place] [facility] [phone]

## 2023-07-21 ENCOUNTER — Ambulatory Visit (INDEPENDENT_AMBULATORY_CARE_PROVIDER_SITE_OTHER): Payer: Medicare HMO | Admitting: Physician Assistant

## 2023-07-21 ENCOUNTER — Encounter: Payer: Self-pay | Admitting: Physician Assistant

## 2023-07-21 VITALS — BP 110/58 | HR 53 | Temp 97.8°F | Ht 62.0 in | Wt 125.0 lb

## 2023-07-21 DIAGNOSIS — Z794 Long term (current) use of insulin: Secondary | ICD-10-CM

## 2023-07-21 DIAGNOSIS — E1169 Type 2 diabetes mellitus with other specified complication: Secondary | ICD-10-CM | POA: Diagnosis not present

## 2023-07-21 DIAGNOSIS — E1159 Type 2 diabetes mellitus with other circulatory complications: Secondary | ICD-10-CM

## 2023-07-21 DIAGNOSIS — D229 Melanocytic nevi, unspecified: Secondary | ICD-10-CM

## 2023-07-21 DIAGNOSIS — I152 Hypertension secondary to endocrine disorders: Secondary | ICD-10-CM

## 2023-07-21 DIAGNOSIS — M810 Age-related osteoporosis without current pathological fracture: Secondary | ICD-10-CM | POA: Diagnosis not present

## 2023-07-21 DIAGNOSIS — E782 Mixed hyperlipidemia: Secondary | ICD-10-CM

## 2023-07-21 DIAGNOSIS — J301 Allergic rhinitis due to pollen: Secondary | ICD-10-CM

## 2023-07-21 DIAGNOSIS — N390 Urinary tract infection, site not specified: Secondary | ICD-10-CM

## 2023-07-21 MED ORDER — LEVOCETIRIZINE DIHYDROCHLORIDE 5 MG PO TABS: 5.0000 mg | ORAL_TABLET | Freq: Every evening | ORAL | 3 refills | Status: DC

## 2023-07-21 MED ORDER — AZITHROMYCIN 250 MG PO TABS: ORAL_TABLET | ORAL | 0 refills | Status: AC

## 2023-07-21 NOTE — Assessment & Plan Note (Addendum)
 Well controlled.  Continue to work on eating a healthy diet and exercise.  Labs drawn today.   No major side effects reported, and no issues with compliance. The current medical regimen is effective;  continue present plan with Zetia 10mg  Will adjust medication as needed depending on labs Lab Results  Component Value Date   LDLCALC 84 04/22/2023

## 2023-07-21 NOTE — Assessment & Plan Note (Signed)
 Well controlled.  Continue to work on eating a healthy diet and exercise.  Labs drawn today.   No major side effects reported, and no issues with compliance. The current medical regimen is effective;  continue present plan with Zetia 10mg  Will adjust medication as needed depending on labs Lab Results  Component Value Date   LDLCALC 84 04/22/2023

## 2023-07-21 NOTE — Assessment & Plan Note (Signed)
 Controlled Continue taking Fosamax  70mg  Denies any side effects or issues

## 2023-07-21 NOTE — Assessment & Plan Note (Signed)
 Improved  Continue to monitor  Continue taking Metoprolol  25mg  Will adjust treatment depending on blood pressure BP Readings from Last 3 Encounters:  07/21/23 (!) 110/58  05/25/23 98/62  04/22/23 100/60

## 2023-07-21 NOTE — Assessment & Plan Note (Signed)
 Denies any worsening or changing symptoms Continue to monitor Will collect sample if symptoms return

## 2023-07-21 NOTE — Assessment & Plan Note (Signed)
 Persistent symptoms despite Flonase  use. Considering trial of different allergy medication. - Prescribe Xyzal and send prescription to pharmacy. - Advise monitoring symptoms and reporting effectiveness of new medication.

## 2023-07-21 NOTE — Patient Instructions (Signed)
 VISIT SUMMARY:  Today, you came in for a follow-up visit to address your chronic sinus issues and diabetes. We discussed your persistent sinus pain, increased blood pressure, elevated A1c levels, and changes in your skin moles. You also mentioned intermittent soreness in your lower right abdomen and occasional constipation.  YOUR PLAN:  -TYPE 2 DIABETES MELLITUS WITH HYPERGLYCEMIA: Your A1c level has increased from 7.1 to 7.8, indicating that your blood sugar control has worsened. This may be due to increased consumption of sweets. We will order blood work to reassess your A1c and blood glucose levels. Please try to reduce your intake of sweets and monitor your dietary habits closely.  -CHRONIC SINUSITIS: Chronic sinusitis is a long-term inflammation of the sinuses. Since your sinus pain and nasal symptoms have not improved with previous treatments, we will prescribe a short course of antibiotics to address a potential sinus infection.  -ALLERGIC RHINITIS: Allergic rhinitis is an allergic reaction that causes sneezing, congestion, and a runny nose. Since Flonase  has not been effective, we will prescribe Xyzal and monitor your symptoms to see if this new medication helps.  -SKIN CANCER: Given the changes in your moles and your history of skin cancer, it is important to have these moles evaluated by a dermatologist. We will refer you to a dermatologist for a thorough examination.  INSTRUCTIONS:  Please follow up with the blood work to reassess your A1c and blood glucose levels. Additionally, schedule an appointment with the dermatologist for an evaluation of your moles. Continue to monitor your symptoms and dietary habits, and report any changes or concerns.

## 2023-07-21 NOTE — Assessment & Plan Note (Signed)
 Moles have changed in appearance. Previous skin cancer necessitates monitoring. Dermatologist evaluation required. - Refer to dermatologist for evaluation of moles, especially the one on the leg and the one that has changed.

## 2023-07-21 NOTE — Assessment & Plan Note (Signed)
 A1c increased from 7.1 to 7.8, indicating worsening glycemic control. Increased sweets consumption may contribute to elevated glucose levels. - Order blood work to reassess A1c and blood glucose levels. - Advise reducing intake of sweets and monitoring dietary habits.

## 2023-07-23 LAB — LIPID PANEL
Chol/HDL Ratio: 3.2 ratio (ref 0.0–4.4)
Cholesterol, Total: 169 mg/dL (ref 100–199)
HDL: 53 mg/dL (ref >39)
LDL Chol Calc (NIH): 95 mg/dL (ref 0–99)
Triglycerides: 120 mg/dL (ref 0–149)
VLDL Cholesterol Cal: 21 mg/dL (ref 5–40)

## 2023-07-23 LAB — HEMOGLOBIN A1C
Est. average glucose Bld gHb Est-mCnc: 180 mg/dL
Hgb A1c MFr Bld: 7.9 % — ABNORMAL HIGH (ref 4.8–5.6)

## 2023-07-23 LAB — CBC WITH DIFFERENTIAL/PLATELET
Basophils Absolute: 0.1 10*3/uL (ref 0.0–0.2)
Basos: 1 %
EOS (ABSOLUTE): 0.1 10*3/uL (ref 0.0–0.4)
Eos: 2 %
Hematocrit: 39.8 % (ref 34.0–46.6)
Hemoglobin: 12.5 g/dL (ref 11.1–15.9)
Immature Grans (Abs): 0 10*3/uL (ref 0.0–0.1)
Immature Granulocytes: 0 %
Lymphocytes Absolute: 2.6 10*3/uL (ref 0.7–3.1)
Lymphs: 36 %
MCH: 28.5 pg (ref 26.6–33.0)
MCHC: 31.4 g/dL — ABNORMAL LOW (ref 31.5–35.7)
MCV: 91 fL (ref 79–97)
Monocytes Absolute: 0.6 10*3/uL (ref 0.1–0.9)
Monocytes: 9 %
Neutrophils Absolute: 4 10*3/uL (ref 1.4–7.0)
Neutrophils: 52 %
Platelets: 188 10*3/uL (ref 150–450)
RBC: 4.38 x10E6/uL (ref 3.77–5.28)
RDW: 13.3 % (ref 11.7–15.4)
WBC: 7.4 10*3/uL (ref 3.4–10.8)

## 2023-07-23 LAB — COMPREHENSIVE METABOLIC PANEL WITH GFR
ALT: 16 IU/L (ref 0–32)
AST: 23 IU/L (ref 0–40)
Albumin: 4.1 g/dL (ref 3.7–4.7)
Alkaline Phosphatase: 58 IU/L (ref 44–121)
BUN/Creatinine Ratio: 21 (ref 12–28)
BUN: 23 mg/dL (ref 8–27)
Bilirubin Total: 0.3 mg/dL (ref 0.0–1.2)
CO2: 25 mmol/L (ref 20–29)
Calcium: 9.2 mg/dL (ref 8.7–10.3)
Chloride: 103 mmol/L (ref 96–106)
Creatinine, Ser: 1.09 mg/dL — ABNORMAL HIGH (ref 0.57–1.00)
Globulin, Total: 2 g/dL (ref 1.5–4.5)
Glucose: 146 mg/dL — ABNORMAL HIGH (ref 70–99)
Potassium: 5.5 mmol/L — ABNORMAL HIGH (ref 3.5–5.2)
Sodium: 139 mmol/L (ref 134–144)
Total Protein: 6.1 g/dL (ref 6.0–8.5)
eGFR: 50 mL/min/{1.73_m2} — ABNORMAL LOW (ref >59)

## 2023-07-23 LAB — MICROALBUMIN / CREATININE URINE RATIO
Creatinine, Urine: 69.5 mg/dL
Microalb/Creat Ratio: 81 mg/g{creat} — ABNORMAL HIGH (ref 0–29)
Microalbumin, Urine: 56.4 ug/mL

## 2023-07-26 ENCOUNTER — Ambulatory Visit: Payer: Self-pay | Admitting: Physician Assistant

## 2023-07-28 ENCOUNTER — Other Ambulatory Visit: Payer: Self-pay | Admitting: Physician Assistant

## 2023-07-28 ENCOUNTER — Telehealth: Payer: Self-pay | Admitting: Physician Assistant

## 2023-07-28 DIAGNOSIS — E1165 Type 2 diabetes mellitus with hyperglycemia: Secondary | ICD-10-CM

## 2023-07-28 MED ORDER — METFORMIN HCL 500 MG PO TABS: 500.0000 mg | ORAL_TABLET | Freq: Two times a day (BID) | ORAL | 3 refills | Status: DC

## 2023-07-28 NOTE — Telephone Encounter (Signed)
 [employer] [employer]

## 2023-07-31 ENCOUNTER — Other Ambulatory Visit: Payer: Self-pay

## 2023-07-31 DIAGNOSIS — E875 Hyperkalemia: Secondary | ICD-10-CM

## 2023-08-02 ENCOUNTER — Other Ambulatory Visit

## 2023-08-02 DIAGNOSIS — E875 Hyperkalemia: Secondary | ICD-10-CM

## 2023-08-03 ENCOUNTER — Ambulatory Visit: Payer: Self-pay

## 2023-08-03 ENCOUNTER — Ambulatory Visit: Payer: Self-pay | Admitting: Physician Assistant

## 2023-08-03 LAB — COMPREHENSIVE METABOLIC PANEL WITH GFR
ALT: 40 IU/L — ABNORMAL HIGH (ref 0–32)
AST: 54 IU/L — ABNORMAL HIGH (ref 0–40)
Albumin: 4.3 g/dL (ref 3.7–4.7)
Alkaline Phosphatase: 59 IU/L (ref 44–121)
BUN/Creatinine Ratio: 17 (ref 12–28)
BUN: 25 mg/dL (ref 8–27)
Bilirubin Total: 0.4 mg/dL (ref 0.0–1.2)
CO2: 21 mmol/L (ref 20–29)
Calcium: 9.4 mg/dL (ref 8.7–10.3)
Chloride: 106 mmol/L (ref 96–106)
Creatinine, Ser: 1.51 mg/dL — ABNORMAL HIGH (ref 0.57–1.00)
Globulin, Total: 2.1 g/dL (ref 1.5–4.5)
Glucose: 101 mg/dL — ABNORMAL HIGH (ref 70–99)
Potassium: 6.4 mmol/L (ref 3.5–5.2)
Sodium: 142 mmol/L (ref 134–144)
Total Protein: 6.4 g/dL (ref 6.0–8.5)
eGFR: 34 mL/min/{1.73_m2} — ABNORMAL LOW (ref >59)

## 2023-08-03 NOTE — Progress Notes (Unsigned)
 The lab called with a critical potassium value of 6.4. I called the patient and spoke to the patient herself and her daughter Carles Cheadle and told them to go to the emergency room right away. Patient and daughter agreed to go to the emergency room to have potassium rechecked and managed in the ER.

## 2023-08-03 NOTE — Telephone Encounter (Signed)
 Copied from CRM [id number]. Topic: Clinical - Lab/Test Results >> Aug 03, 2023  7:50 AM Baldemar Lev wrote: Reason for CRM: Patient was called at 3:13 am regarding labs, they have questions. Carles Cheadle the daughter is calling.  This RN read this note and instructed daughter to take patient to the ER now. Daughter denies questions and states will take her there now.   The lab called with a critical potassium value of 6.4. I called the patient and spoke to the patient herself and her daughter Carles Cheadle and told them to go to the emergency room right away. Patient and daughter agreed to go to the emergency room to have potassium rechecked and managed in the ER.

## 2023-08-04 NOTE — Progress Notes (Signed)
 Subjective:  Patient ID: Rebecca Hodge, female    DOB: Jul 09, 1940  Age: 83 y.o. MRN: [id number]  No chief complaint on file.   HPI:   Follow up Hospitalization  Patient was in the ER at [facility] on 08/03/2023 and discharged on same day. She was treated for hyperkalemia, hyperglycemia.. Treatment for this included IV fluids and Lokelma She reports excellent compliance with treatment. She reports this condition is improved  Discussed the use of AI scribe software for clinical note transcription with the patient, who gave verbal consent to proceed.  History of Present Illness   Rebecca Hodge is an 83 year old female who presents for a follow-up after a recent episode of hyperkalemia.  She was recently hospitalized due to a very high potassium level, which was successfully lowered during her stay. She is here to ensure that her potassium level remains stable.  She has been experiencing fluctuations in her blood sugar levels. Dietary changes have been implemented with the help of her family, including reducing sugar intake and monitoring food labels for sodium and sugar content. A recent blood sugar level was 109, which she describes as the best she has had. She is monitoring her blood sugar levels daily. She has been experiencing side effects from metformin , which was restarted recently.  She reports a decrease in her weight and is considering protein shakes to help with this. Nutritional support is necessary to prevent further weight loss and maintain health.  She has a history of kidney stones and gallstones but denies any current kidney stones. She experiences occasional pain in her right ear and down her side, which she associates with a previous fall. No current fever or chest pain.  She mentions having a sore knot at the site of a previous blood draw, causing discomfort.     .       07/21/2023    8:33 AM 04/22/2023    9:20 AM 10/20/2022    8:22 AM 09/07/2022    9:23  AM 07/08/2022    8:51 AM  Depression screen PHQ 2/9  Decreased Interest 0 0 0 0 0  Down, Depressed, Hopeless 0 0 0 0 0  PHQ - 2 Score 0 0 0 0 0  Altered sleeping 0 0 0  0  Tired, decreased energy 0 0 0  0  Change in appetite 0 0 0  0  Feeling bad or failure about yourself  0 0 0  0  Trouble concentrating 0 0 0  0  Moving slowly or fidgety/restless 0 0 0  0  Suicidal thoughts 0 0 0  0  PHQ-9 Score 0 0 0  0  Difficult doing work/chores Not difficult at all Not difficult at all Not difficult at all  Not difficult at all        07/21/2023    8:30 AM  Fall Risk   Falls in the past year? 0  Number falls in past yr: 0  Injury with Fall? 0  Risk for fall due to : No Fall Risks  Follow up Falls evaluation completed    Patient Care Team: Rebecca Hodge, [place] as PCP - General (Physician Assistant) Devon Fogo, [facility] (Inactive) as Pharmacist (Pharmacist)   Review of Systems  Constitutional:  Negative for appetite change, fatigue and fever.  HENT:  Negative for congestion, ear pain, sinus pressure and sore throat.   Respiratory:  Negative for cough, chest tightness, shortness of breath and wheezing.   Cardiovascular:  Negative for chest pain and palpitations.  Gastrointestinal:  Negative for abdominal pain, constipation, diarrhea, nausea and vomiting.  Genitourinary:  Negative for dysuria and hematuria.  Musculoskeletal:  Negative for arthralgias, back pain, joint swelling and myalgias.  Skin:  Negative for rash.  Neurological:  Negative for dizziness, weakness and headaches.  Psychiatric/Behavioral:  Negative for dysphoric mood. The patient is not nervous/anxious.     Current Outpatient Medications on File Prior to Visit  Medication Sig Dispense Refill   alendronate  (FOSAMAX ) 70 MG tablet Take 1 tablet (70 mg total) by mouth once a week. 6 tablet 2   BD PEN NEEDLE NANO U/F 32G X 4 MM MISC Use new needle with each injection 50 each 1   Blood Glucose Monitoring Suppl (ACCU-CHEK GUIDE)  w/Device KIT 1 each by Other route daily. 1 kit 0   estradiol  (ESTRACE ) 0.1 MG/GM vaginal cream INSERT 1 APPLICATORFUL VAGINALLY 3 TIMES A WEEK 42.5 g 3   ezetimibe  (ZETIA ) 10 MG tablet Take 1 tablet (10 mg total) by mouth daily. 90 tablet 3   fluticasone  (FLONASE ) 50 MCG/ACT nasal spray Place 2 sprays into both nostrils daily. 16 g 6   gabapentin  (NEURONTIN ) 300 MG capsule Take 1 capsule (300 mg total) by mouth 3 (three) times daily. 270 capsule 2   glucose blood (ACCU-CHEK GUIDE TEST) test strip Check FBS daily 100 each 12   Lancets (ONETOUCH ULTRASOFT) lancets 1 each by Other route daily. Use as instructed 100 each 2   levocetirizine (XYZAL  ALLERGY 24HR) 5 MG tablet Take 1 tablet (5 mg total) by mouth every evening. 90 tablet 3   liraglutide  (VICTOZA ) 18 MG/3ML SOPN Inject 1.2 mg into the skin daily. 9 mL 1   lisinopril  (ZESTRIL ) 20 MG tablet Take 1 tablet (20 mg total) by mouth daily. 90 tablet 1   metoprolol  tartrate (LOPRESSOR ) 25 MG tablet Take 1 tablet (25 mg total) by mouth 2 (two) times daily. 180 tablet 2   nitrofurantoin  (MACRODANTIN ) 50 MG capsule Take 50 mg by mouth at bedtime.     pantoprazole  (PROTONIX ) 40 MG tablet Take 1 tablet (40 mg total) by mouth daily. 90 tablet 2   rosuvastatin  (CRESTOR ) 40 MG tablet Take 1 tablet (40 mg total) by mouth at bedtime. 90 tablet 2   Vitamin D , Ergocalciferol , (DRISDOL ) 1.25 MG (50000 UNIT) CAPS capsule Take 1 capsule (50,000 Units total) by mouth every 7 (seven) days. 12 capsule 6   No current facility-administered medications on file prior to visit.   Past Medical History:  Diagnosis Date   Age-related osteoporosis without current pathological fracture 06/18/2019   Allergic rhinitis    BMI 27.0-27.9,adult 08/17/2019   Diabetes (HCC)    Dysphasia following cerebral infarction 06/18/2019   Hyperlipidemia    Hypertension    Mixed hyperlipidemia 06/18/2019   Mixed incontinence 06/18/2019   Sequelae of poliomyelitis 06/18/2019   Type 2 diabetes  mellitus with other specified complication (HCC) 06/18/2019   Past Surgical History:  Procedure Laterality Date   ABDOMINAL HYSTERECTOMY     CATARACT EXTRACTION Bilateral    CHOLECYSTECTOMY     ROTATOR CUFF REPAIR Bilateral     History reviewed. No pertinent family history. Social History   Socioeconomic History   Marital status: Widowed    Spouse name: Not on file   Number of children: Not on file   Years of education: Not on file   Highest education level: 8th grade  Occupational History   Occupation: Disabled  Tobacco Use  Smoking status: Former    Current packs/day: 0.00    Average packs/day: 1 pack/day for 5.0 years (5.0 ttl pk-yrs)    Types: Cigarettes    Start date: 03/15/1982    Quit date: 03/16/1987    Years since quitting: 36.4   Smokeless tobacco: Never  Vaping Use   Vaping status: Never Used  Substance and Sexual Activity   Alcohol use: No   Drug use: No   Sexual activity: Not Currently  Other Topics Concern   Not on file  Social History Narrative   Not on file   Social Drivers of Health   Financial Resource Strain: Low Risk  (09/07/2022)   Overall Financial Resource Strain (CARDIA)    Difficulty of Paying Living Expenses: Not hard at all  Food Insecurity: No Food Insecurity (09/07/2022)   Hunger Vital Sign    Worried About Running Out of Food in the Last Year: Never true    Ran Out of Food in the Last Year: Never true  Transportation Needs: No Transportation Needs (09/07/2022)   PRAPARE - Administrator, Civil Service (Medical): No    Lack of Transportation (Non-Medical): No  Physical Activity: Sufficiently Active (09/07/2022)   Exercise Vital Sign    Days of Exercise per Week: 5 days    Minutes of Exercise per Session: 30 min  Stress: No Stress Concern Present (09/07/2022)   [employer] of Occupational Health - Occupational Stress Questionnaire    Feeling of Stress : Not at all  Social Connections: Moderately Isolated (09/07/2022)    Social Connection and Isolation Panel [NHANES]    Frequency of Communication with Friends and Family: Three times a week    Frequency of Social Gatherings with Friends and Family: Twice a week    Attends Religious Services: More than 4 times per year    Active Member of [employer] or Organizations: No    Attends Banker Meetings: Never    Marital Status: Widowed    Objective:  BP 110/72 (BP Location: Left Arm, Patient Position: Sitting)   Pulse 62   Temp 97.8 F (36.6 C) (Temporal)   Ht 5\' 2"  (1.575 m)   Wt 123 lb (55.8 kg)   LMP  (LMP Unknown)   SpO2 98%   BMI 22.50 kg/m      08/05/2023    8:55 AM 07/21/2023    8:35 AM 05/25/2023    1:15 PM  BP/Weight  Systolic BP 110 110 98  Diastolic BP 72 58 62  Wt. (Lbs) 123 125 122.8  BMI 22.5 kg/m2 22.86 kg/m2 22.46 kg/m2    Physical Exam Vitals reviewed.  Constitutional:      Appearance: Normal appearance.  Neck:     Vascular: No carotid bruit.  Cardiovascular:     Rate and Rhythm: Normal rate and regular rhythm.     Heart sounds: Normal heart sounds.  Pulmonary:     Effort: Pulmonary effort is normal.     Breath sounds: Normal breath sounds.  Abdominal:     General: Bowel sounds are normal.     Palpations: Abdomen is soft.     Tenderness: There is no abdominal tenderness.  Skin:    Findings: Bruising, erythema and wound present.          Comments: Bilaterally antecubital fossa due to needing IV in ER  Neurological:     Mental Status: She is alert and oriented to person, place, and time.  Psychiatric:  Mood and Affect: Mood normal.        Behavior: Behavior normal.     Diabetic Foot Exam - Simple   No data filed      Lab Results  Component Value Date   WBC 7.4 07/21/2023   HGB 12.5 07/21/2023   HCT 39.8 07/21/2023   PLT 188 07/21/2023   GLUCOSE 101 (H) 08/02/2023   CHOL 169 07/21/2023   TRIG 120 07/21/2023   HDL 53 07/21/2023   LDLCALC 95 07/21/2023   ALT 40 (H) 08/02/2023   AST 54 (H)  08/02/2023   NA 142 08/02/2023   K 6.4 (HH) 08/02/2023   CL 106 08/02/2023   CREATININE 1.51 (H) 08/02/2023   BUN 25 08/02/2023   CO2 21 08/02/2023   TSH 1.670 04/22/2023   HGBA1C 7.9 (H) 07/21/2023   MICROALBUR 30 07/21/2020      Assessment & Plan:  Hyperkalemia Assessment & Plan: Recent hyperkalemia with critically high potassium levels managed. Follow-up needed for stability. Discussed alternative blood draw sites due to discomfort. - Recheck potassium levels today. If unable to obtain, arrange testing at [facility] over the weekend. - Apply heating pad for blood draw discomfort.  Orders: -     Comprehensive metabolic panel with GFR  AKI (acute kidney injury) (HCC) Assessment & Plan: Kidney function decreased to mid 30 while getting treatment Improved later during visit Continue to monitor for any worsening symptoms Will adjust treatment based on symptoms Labs drawn today   Function kidney decreased Assessment & Plan: Chronic kidney disease with recent decline. Hospitalized for IV fluids. Monitoring essential to prevent further decline. Encouraged increased fluid intake. - Recheck kidney function today. If unable to obtain, arrange testing at [facility] over the weekend. - Encourage increased fluid intake.   Type 2 diabetes mellitus with stage 3a chronic kidney disease, without long-term current use of insulin  (HCC) Assessment & Plan: Diabetes management complicated by dietary changes and metformin  side effects. Blood sugar improved with dietary modifications. Metformin  restarted but caused side effects. Focus on dietary management. - Continue dietary modifications. - Monitor blood sugar levels daily. - Re-evaluate A1c in a few months. - Provide sugar-controlled protein shake samples.         No orders of the defined types were placed in this encounter.   Orders Placed This Encounter  Procedures   Comprehensive metabolic panel with GFR     Follow-up: No  follow-ups on file.   I,Lauren M Auman,acting as a Neurosurgeon for [employer], PA.,have documented all relevant documentation on the behalf of Rebecca Bennett, PA,as directed by  Rebecca Bennett, PA while in the presence of Rebecca Hodge, [place].   An After Visit Summary was printed and given to the patient.  Rebecca Hodge, [place] [facility] [phone]

## 2023-08-05 ENCOUNTER — Encounter: Payer: Self-pay | Admitting: Physician Assistant

## 2023-08-05 ENCOUNTER — Ambulatory Visit (INDEPENDENT_AMBULATORY_CARE_PROVIDER_SITE_OTHER): Admitting: Physician Assistant

## 2023-08-05 VITALS — BP 110/72 | HR 62 | Temp 97.8°F | Ht 62.0 in | Wt 123.0 lb

## 2023-08-05 DIAGNOSIS — E875 Hyperkalemia: Secondary | ICD-10-CM | POA: Diagnosis not present

## 2023-08-05 DIAGNOSIS — E1122 Type 2 diabetes mellitus with diabetic chronic kidney disease: Secondary | ICD-10-CM | POA: Diagnosis not present

## 2023-08-05 DIAGNOSIS — N289 Disorder of kidney and ureter, unspecified: Secondary | ICD-10-CM | POA: Insufficient documentation

## 2023-08-05 DIAGNOSIS — N1831 Chronic kidney disease, stage 3a: Secondary | ICD-10-CM

## 2023-08-05 DIAGNOSIS — N179 Acute kidney failure, unspecified: Secondary | ICD-10-CM | POA: Diagnosis not present

## 2023-08-05 NOTE — Patient Instructions (Signed)
 VISIT SUMMARY:  Today, we reviewed your recent hospitalization for high potassium levels and discussed your ongoing health concerns, including kidney disease, diabetes, and weight loss. We also addressed the discomfort from a previous blood draw.  YOUR PLAN:  -HYPERKALEMIA: Hyperkalemia means having a high level of potassium in your blood, which can be dangerous. We will recheck your potassium levels today, and if we can't get the sample, you will need to go to [facility] over the weekend. To help with the discomfort from the blood draw, you can use a heating pad.  -CHRONIC KIDNEY DISEASE: Chronic kidney disease means your kidneys are not working as well as they should. We will recheck your kidney function today, and if we can't get the sample, you will need to go to [facility] over the weekend. It's important to drink more fluids to help your kidneys.  -DIABETES MELLITUS: Diabetes means your blood sugar levels are too high. Your blood sugar has improved with dietary changes, but you are experiencing side effects from metformin . Continue with the dietary changes, monitor your blood sugar daily, and we will re-evaluate your A1c in a few months. We will also provide you with sugar-controlled protein shake samples to help with your weight.  INSTRUCTIONS:  Please follow up with [facility] over the weekend if we are unable to obtain your blood samples today. Continue monitoring your blood sugar levels daily and maintain your dietary changes. Use a heating pad for any discomfort from the blood draw site.

## 2023-08-05 NOTE — Assessment & Plan Note (Addendum)
 Recent hyperkalemia with critically high potassium levels managed. Follow-up needed for stability. Discussed alternative blood draw sites due to discomfort. - Recheck potassium levels today. If unable to obtain, arrange testing at [facility] over the weekend. - Apply heating pad for blood draw discomfort.

## 2023-08-05 NOTE — Assessment & Plan Note (Signed)
 Chronic kidney disease with recent decline. Hospitalized for IV fluids. Monitoring essential to prevent further decline. Encouraged increased fluid intake. - Recheck kidney function today. If unable to obtain, arrange testing at [facility] over the weekend. - Encourage increased fluid intake.

## 2023-08-05 NOTE — Assessment & Plan Note (Signed)
 Diabetes management complicated by dietary changes and metformin  side effects. Blood sugar improved with dietary modifications. Metformin  restarted but caused side effects. Focus on dietary management. - Continue dietary modifications. - Monitor blood sugar levels daily. - Re-evaluate A1c in a few months. - Provide sugar-controlled protein shake samples.

## 2023-08-05 NOTE — Assessment & Plan Note (Addendum)
 Kidney function decreased to mid 30 while getting treatment Improved later during visit Continue to monitor for any worsening symptoms Will adjust treatment based on symptoms Labs drawn today

## 2023-08-06 LAB — COMPREHENSIVE METABOLIC PANEL WITH GFR
ALT: 34 IU/L — ABNORMAL HIGH (ref 0–32)
AST: 35 IU/L (ref 0–40)
Albumin: 4.7 g/dL (ref 3.7–4.7)
Alkaline Phosphatase: 62 IU/L (ref 44–121)
BUN/Creatinine Ratio: 16 (ref 12–28)
BUN: 18 mg/dL (ref 8–27)
Bilirubin Total: 0.7 mg/dL (ref 0.0–1.2)
CO2: 18 mmol/L — ABNORMAL LOW (ref 20–29)
Calcium: 10 mg/dL (ref 8.7–10.3)
Chloride: 103 mmol/L (ref 96–106)
Creatinine, Ser: 1.1 mg/dL — ABNORMAL HIGH (ref 0.57–1.00)
Globulin, Total: 2.2 g/dL (ref 1.5–4.5)
Glucose: 72 mg/dL (ref 70–99)
Potassium: 5.1 mmol/L (ref 3.5–5.2)
Sodium: 139 mmol/L (ref 134–144)
Total Protein: 6.9 g/dL (ref 6.0–8.5)
eGFR: 50 mL/min/{1.73_m2} — ABNORMAL LOW (ref >59)

## 2023-08-09 ENCOUNTER — Ambulatory Visit: Payer: Self-pay | Admitting: Physician Assistant

## 2023-09-15 ENCOUNTER — Ambulatory Visit

## 2023-09-20 ENCOUNTER — Ambulatory Visit: Payer: Self-pay

## 2023-09-20 NOTE — Telephone Encounter (Signed)
 FYI Only or Action Required?: FYI only for provider.  Patient was last seen in primary care on 08/05/2023 by Milon Cleaves, PA.  Called Nurse Triage reporting pain all over her body, no appetite, not eating, Weakness, Altered Mental Status, Diarrhea, Excessive Sweating, Pallor, cool to touch, awake and talking, Cough, Chest Pain, Aphasia, and Headache.  Symptoms began yesterday.  Interventions attempted: Rest, hydration, or home remedies.  Symptoms are: rapidly worsening.  Triage Disposition: Call EMS 911 Now  Patient/caregiver understands and will follow disposition?: Yes     Copied from CRM [id number]. Topic: Clinical - Red Word Triage >> Sep 20, 2023  9:29 AM Rebecca Hodge wrote: Red Word that prompted transfer to Nurse Triage: Daughter Rebecca Hodge states pt is hurting all over-so bad she can hardly move, wont eat. States this began yesterday. Daughter was attempting to explain some additional symptoms but was hard to hear, even when repeating it multiple times. She did say I don't know if she had a stroke or what! And that she may be dehydrated. Reason for Disposition  [1] Difficult to awaken or acting confused (e.g., disoriented, slurred speech) AND [2] present now AND [3] diabetes mellitus  Answer Assessment - Initial Assessment Questions 1. LEVEL OF CONSCIOUSNESS: How is he (she, the patient) acting right now? (e.g., alert-oriented, confused, lethargic, stuporous, comatose)     Confirms more confused than usual 2. ONSET: When did the confusion start?  (minutes, hours, days)     This morning 3. PATTERN Does this come and go, or has it been constant since it started?  Is it present now?     Confusion stays 5. NARCOTIC MEDICINES: Has he been receiving any narcotic medications? (e.g., morphine, Vicodin)     no 7. OTHER SYMPTOMS: Are there any other symptoms? (e.g., difficulty breathing, headache, fever, weakness)     Headache 6/10, coughing a lot, weaker than usual, just wants to  sleep don't want to stay awake, won't eat, can hardly move, weak both sides, slurred speech No fever or SOB Hx stroke Sore in chest, back, legs, everything Diarrhea, no blood in stool Started yesterday but worse today, didn't feel good yesterday all she did was lay around Mouth was like going inward Real pale, just white as a piece of cotton Breathing normally Awake and talking on phone She's real cool to the touch, sweaty cool Didn't take her meds this morning including diabetes meds It's all she can to stay awake No vomiting  911 having pt take 4 baby aspirin and chew them up  Nurse stayed on line until family disconnected upon reporting that EMS was at back door of the trailer  Protocols used: Confusion - Delirium-A-AH

## 2023-09-20 NOTE — Telephone Encounter (Signed)
 Incorrect department. E2C2 error reported.

## 2023-09-26 ENCOUNTER — Telehealth: Payer: Self-pay | Admitting: Physician Assistant

## 2023-09-26 NOTE — Telephone Encounter (Signed)
 Aeroflow Incontinence Order

## 2023-10-04 ENCOUNTER — Other Ambulatory Visit: Payer: Self-pay | Admitting: Physician Assistant

## 2023-10-04 DIAGNOSIS — M810 Age-related osteoporosis without current pathological fracture: Secondary | ICD-10-CM

## 2023-10-10 ENCOUNTER — Other Ambulatory Visit: Payer: Self-pay | Admitting: Physician Assistant

## 2023-10-11 ENCOUNTER — Other Ambulatory Visit: Payer: Self-pay | Admitting: Family Medicine

## 2023-10-11 DIAGNOSIS — E1169 Type 2 diabetes mellitus with other specified complication: Secondary | ICD-10-CM

## 2023-10-24 ENCOUNTER — Ambulatory Visit: Admitting: Physician Assistant

## 2023-10-24 ENCOUNTER — Encounter: Payer: Self-pay | Admitting: Physician Assistant

## 2023-10-24 VITALS — BP 130/64 | HR 66 | Temp 97.4°F | Resp 16 | Ht 62.0 in | Wt 123.0 lb

## 2023-10-24 DIAGNOSIS — J302 Other seasonal allergic rhinitis: Secondary | ICD-10-CM

## 2023-10-24 DIAGNOSIS — T1490XD Injury, unspecified, subsequent encounter: Secondary | ICD-10-CM

## 2023-10-24 DIAGNOSIS — M810 Age-related osteoporosis without current pathological fracture: Secondary | ICD-10-CM

## 2023-10-24 DIAGNOSIS — Z794 Long term (current) use of insulin: Secondary | ICD-10-CM

## 2023-10-24 DIAGNOSIS — N1831 Chronic kidney disease, stage 3a: Secondary | ICD-10-CM | POA: Diagnosis not present

## 2023-10-24 DIAGNOSIS — E782 Mixed hyperlipidemia: Secondary | ICD-10-CM

## 2023-10-24 DIAGNOSIS — E1169 Type 2 diabetes mellitus with other specified complication: Secondary | ICD-10-CM

## 2023-10-24 DIAGNOSIS — R911 Solitary pulmonary nodule: Secondary | ICD-10-CM

## 2023-10-24 DIAGNOSIS — H918X1 Other specified hearing loss, right ear: Secondary | ICD-10-CM

## 2023-10-24 DIAGNOSIS — R1084 Generalized abdominal pain: Secondary | ICD-10-CM | POA: Diagnosis not present

## 2023-10-24 DIAGNOSIS — E1159 Type 2 diabetes mellitus with other circulatory complications: Secondary | ICD-10-CM

## 2023-10-24 DIAGNOSIS — E559 Vitamin D deficiency, unspecified: Secondary | ICD-10-CM | POA: Diagnosis not present

## 2023-10-24 LAB — POCT LIPID PANEL
HDL: 55
LDL: 83
Non-HDL: 94
TC/HDL: 1.5
TC: 149
TRG: 57

## 2023-10-24 LAB — POCT GLYCOSYLATED HEMOGLOBIN (HGB A1C)
HbA1c POC (<> result, manual entry): 6.1 % (ref 4.0–5.6)
Hemoglobin A1C: 6.1 % — AB (ref 4.0–5.6)

## 2023-10-24 MED ORDER — ALENDRONATE SODIUM 70 MG PO TABS: 70.0000 mg | ORAL_TABLET | ORAL | 3 refills | Status: AC

## 2023-10-24 MED ORDER — GABAPENTIN 300 MG PO CAPS: 300.0000 mg | ORAL_CAPSULE | Freq: Three times a day (TID) | ORAL | 2 refills | Status: AC

## 2023-10-24 MED ORDER — PANTOPRAZOLE SODIUM 40 MG PO TBEC
40.0000 mg | DELAYED_RELEASE_TABLET | Freq: Every day | ORAL | 2 refills | Status: AC
Start: 2023-10-24 — End: ?

## 2023-10-24 MED ORDER — TRESIBA FLEXTOUCH 200 UNIT/ML ~~LOC~~ SOPN: 12.0000 [IU] | PEN_INJECTOR | Freq: Two times a day (BID) | SUBCUTANEOUS | 3 refills | Status: AC

## 2023-10-24 MED ORDER — EZETIMIBE 10 MG PO TABS
10.0000 mg | ORAL_TABLET | Freq: Every day | ORAL | 3 refills | Status: DC
Start: 2023-10-24 — End: 2023-12-15

## 2023-10-24 MED ORDER — METOPROLOL TARTRATE 25 MG PO TABS: 25.0000 mg | ORAL_TABLET | Freq: Two times a day (BID) | ORAL | 2 refills | Status: AC

## 2023-10-24 MED ORDER — VITAMIN D (ERGOCALCIFEROL) 1.25 MG (50000 UNIT) PO CAPS: 50000.0000 [IU] | ORAL_CAPSULE | ORAL | 6 refills | Status: AC

## 2023-10-24 MED ORDER — FLUTICASONE PROPIONATE 50 MCG/ACT NA SUSP: 2.0000 | Freq: Every day | NASAL | 6 refills | Status: AC

## 2023-10-24 MED ORDER — AZITHROMYCIN 250 MG PO TABS: ORAL_TABLET | ORAL | 0 refills | Status: DC

## 2023-10-24 NOTE — Assessment & Plan Note (Signed)
 Type 2 diabetes with fluctuating glucose levels. Recent switch from Victoza  to Tresiba . Weight stable despite recent fluctuations. - Check fasting blood glucose today. - Attempt to run A1c in office. - Continue Tresiba  12 units daily. - Continue metformin . - Monitor blood glucose levels before and after meals. - Schedule lab visit for comprehensive blood work including A1c if not done today. Lab Results  Component Value Date   HGBA1C 6.1 (A) 10/24/2023   HGBA1C 6.1 10/24/2023   HGBA1C 7.9 (H) 07/21/2023

## 2023-10-24 NOTE — Progress Notes (Unsigned)
 Subjective:  Patient ID: Rebecca Hodge, female    DOB: 1940-09-12  Age: 83 y.o. MRN: [id number]  Chief Complaint  Patient presents with   Medical Management of Chronic Issues    HPI:  Patient is here for a chronic follow up for Dm2, HYPERTENSION and hyperlipidemia  HYPERTENSION: She was last seen for hypertension 1 weeks ago.  Management: lisinopril  20 mg daily and metoprolol  25 mg, two times daily. She is following a Regular diet. She is exercising.walking dog. Doing house chores She does not smoke.  Diabetes Mellitus Type II, Follow-up Management  includes tresiba  12 U daily, . She reports good compliance with treatment. Last A1C 7.9% Home blood sugar records: fasting range: 104-178 Most Recent Eye Exam: overdue Current exercise: walking Current diet habits: well balanced  Hyperlipid/Cholesterol, Follow-up Management includes crestor  40 mg daily at bed time and zetia  10 mg daily. Current diet: well balanced Current exercise: walking  Osteoporosis: on Fosamax  70 mg weekly. On Vitamin D  50K weekly.  Discussed the use of AI scribe software for clinical note transcription with the patient, who gave verbal consent to proceed.  History of Present Illness  Rebecca Hodge is an 83 year old female with diabetes who presents for a follow-up regarding blood sugar management.  She has been monitoring her blood sugar three times a day, although she recalls being told to check it once a day previously. Her blood sugar levels have been fluctuating, with some readings being high. She is currently taking Tresiba , 12 units every morning and night, and metformin . She was previously on Victoza  but is no longer taking it. She is concerned about her weight loss, noting a decrease from 122 pounds in March to 123 pounds currently, and is trying to maintain her weight by eating regularly. Her diet includes foods like spaghetti, lasagna, cream of potatoes, gravy, pinto beans, and salads. She  enjoys strawberry and chocolate shakes.  About a month ago, she hit her arm while going to the bathroom in the dark, resulting in a sore that has since improved. She keeps a Band-Aid on it to prevent picking.  She experiences headaches localized to the upper part of her head. No recent antibiotics or hospitalizations for this issue. No worsening cough.  She has a history of a tube placed in her ear at [facility], which remains in place. No pain reported in the ear area. She has an upcoming appointment to have her ears checked.  She recalls being told at the hospital that there was a small spot on her lung, similar to a previous scan in March. She continues to use Flonase  for her symptoms.         10/24/2023    9:34 AM 07/21/2023    8:33 AM 04/22/2023    9:20 AM 10/20/2022    8:22 AM 09/07/2022    9:23 AM  Depression screen PHQ 2/9  Decreased Interest 0 0 0 0 0  Down, Depressed, Hopeless 0 0 0 0 0  PHQ - 2 Score 0 0 0 0 0  Altered sleeping 0 0 0 0   Tired, decreased energy 0 0 0 0   Change in appetite 0 0 0 0   Feeling bad or failure about yourself  0 0 0 0   Trouble concentrating 0 0 0 0   Moving slowly or fidgety/restless  0 0 0   Suicidal thoughts 0 0 0 0   PHQ-9 Score 0 0 0 0   Difficult  doing work/chores Not difficult at all Not difficult at all Not difficult at all Not difficult at all         10/24/2023    9:34 AM  Fall Risk   Falls in the past year? 0  Number falls in past yr: 0  Injury with Fall? 0  Risk for fall due to : Impaired balance/gait  Follow up Education provided    Patient Care Team: Milon Cleaves, Rebecca Hodge as PCP - General (Physician Assistant) Nyle Rankin POUR, [facility] (Inactive) as Pharmacist (Pharmacist)   Review of Systems  Constitutional:  Negative for chills, fatigue and fever.  HENT:  Positive for congestion and sinus pain. Negative for ear pain and sore throat.   Respiratory:  Negative for cough and shortness of breath.   Cardiovascular:  Negative for  chest pain and palpitations.  Gastrointestinal:  Negative for abdominal pain, constipation, diarrhea, nausea and vomiting.  Endocrine: Negative for polydipsia, polyphagia and polyuria.  Genitourinary:  Negative for difficulty urinating and dysuria.  Musculoskeletal:  Negative for arthralgias, back pain and myalgias.  Skin:  Negative for rash.  Neurological:  Positive for headaches.  Psychiatric/Behavioral:  Negative for dysphoric mood. The patient is not nervous/anxious.     Current Outpatient Medications on File Prior to Visit  Medication Sig Dispense Refill   BD PEN NEEDLE NANO U/F 32G X 4 MM MISC Use new needle with each injection 50 each 1   Blood Glucose Monitoring Suppl (ACCU-CHEK GUIDE) w/Device KIT 1 each by Other route daily. 1 kit 0   estradiol  (ESTRACE ) 0.1 MG/GM vaginal cream INSERT 1 APPLICATORFUL VAGINALLY 3 TIMES A WEEK 42.5 g 3   glucose blood (ACCU-CHEK GUIDE TEST) test strip Check FBS daily 100 each 12   Lancets (ONETOUCH ULTRASOFT) lancets 1 each by Other route daily. Use as instructed 100 each 2   levocetirizine (XYZAL  ALLERGY 24HR) 5 MG tablet Take 1 tablet (5 mg total) by mouth every evening. 90 tablet 3   liraglutide  (VICTOZA ) 18 MG/3ML SOPN Inject 1.2 mg into the skin daily. 9 mL 1   lisinopril  (ZESTRIL ) 20 MG tablet TAKE 1 TABLET(20 MG) BY MOUTH DAILY 90 tablet 1   mupirocin  ointment (BACTROBAN ) 2 % Apply topically 2 (two) times daily.     nitrofurantoin  (MACRODANTIN ) 50 MG capsule Take 50 mg by mouth at bedtime.     rosuvastatin  (CRESTOR ) 40 MG tablet Take 1 tablet (40 mg total) by mouth at bedtime. 90 tablet 2   clotrimazole-betamethasone (LOTRISONE) cream Apply topically.     No current facility-administered medications on file prior to visit.   Past Medical History:  Diagnosis Date   Age-related osteoporosis without current pathological fracture 06/18/2019   Allergic rhinitis    BMI 27.0-27.9,adult 08/17/2019   Diabetes (HCC)    Dysphasia following cerebral  infarction 06/18/2019   Hyperlipidemia    Hypertension    Mixed hyperlipidemia 06/18/2019   Mixed incontinence 06/18/2019   Sequelae of poliomyelitis 06/18/2019   Type 2 diabetes mellitus with other specified complication (HCC) 06/18/2019   Past Surgical History:  Procedure Laterality Date   ABDOMINAL HYSTERECTOMY     CATARACT EXTRACTION Bilateral    CHOLECYSTECTOMY     ROTATOR CUFF REPAIR Bilateral     History reviewed. No pertinent family history. Social History   Socioeconomic History   Marital status: Widowed    Spouse name: Not on file   Number of children: Not on file   Years of education: Not on file   Highest education  level: 8th grade  Occupational History   Occupation: Disabled  Tobacco Use   Smoking status: Former    Current packs/day: 0.00    Average packs/day: 1 pack/day for 5.0 years (5.0 ttl pk-yrs)    Types: Cigarettes    Start date: 03/15/1982    Quit date: 03/16/1987    Years since quitting: 36.6   Smokeless tobacco: Never  Vaping Use   Vaping status: Never Used  Substance and Sexual Activity   Alcohol use: No   Drug use: No   Sexual activity: Not Currently  Other Topics Concern   Not on file  Social History Narrative   Not on file   Social Drivers of Health   Financial Resource Strain: Low Risk  (09/07/2022)   Overall Financial Resource Strain (CARDIA)    Difficulty of Paying Living Expenses: Not hard at all  Food Insecurity: No Food Insecurity (09/07/2022)   Hunger Vital Sign    Worried About Running Out of Food in the Last Year: Never true    Ran Out of Food in the Last Year: Never true  Transportation Needs: No Transportation Needs (09/07/2022)   PRAPARE - Administrator, Civil Service (Medical): No    Lack of Transportation (Non-Medical): No  Physical Activity: Sufficiently Active (09/07/2022)   Exercise Vital Sign    Days of Exercise per Week: 5 days    Minutes of Exercise per Session: 30 min  Stress: No Stress Concern Present  (09/07/2022)   [employer] of Occupational Health - Occupational Stress Questionnaire    Feeling of Stress : Not at all  Social Connections: Moderately Isolated (09/07/2022)   Social Connection and Isolation Panel    Frequency of Communication with Friends and Family: Three times a week    Frequency of Social Gatherings with Friends and Family: Twice a week    Attends Religious Services: More than 4 times per year    Active Member of [employer] or Organizations: No    Attends Banker Meetings: Never    Marital Status: Widowed    Objective:  BP 130/64   Pulse 66   Temp (!) 97.4 F (36.3 C)   Resp 16   Ht 5' 2 (1.575 m)   Wt 123 lb (55.8 kg)   LMP  (LMP Unknown)   SpO2 96%   BMI 22.50 kg/m      10/24/2023    9:21 AM 08/05/2023    8:55 AM 07/21/2023    8:35 AM  BP/Weight  Systolic BP 130 110 110  Diastolic BP 64 72 58  Wt. (Lbs) 123 123 125  BMI 22.5 kg/m2 22.5 kg/m2 22.86 kg/m2    Physical Exam Vitals reviewed.  Constitutional:      Appearance: Normal appearance.  Neck:     Vascular: No carotid bruit.  Cardiovascular:     Rate and Rhythm: Normal rate and regular rhythm.     Heart sounds: Normal heart sounds.  Pulmonary:     Effort: Pulmonary effort is normal.     Breath sounds: Normal breath sounds.  Abdominal:     General: Bowel sounds are normal.     Palpations: Abdomen is soft.     Tenderness: There is no abdominal tenderness.  Skin:    Findings: Wound present.         Comments: Abnormal mole, plans on seeing dermatology Abrasion on left arm healing well.   Neurological:     Mental Status: She is alert and oriented to  person, place, and time.  Psychiatric:        Mood and Affect: Mood normal.        Behavior: Behavior normal.     {Perform Simple Foot Exam  Perform Detailed exam:1} {Insert foot Exam (Optional):30965}   Lab Results  Component Value Date   WBC 6.6 10/28/2023   HGB 12.3 10/28/2023   HCT 39.9 10/28/2023   PLT 216  10/28/2023   GLUCOSE 75 10/28/2023   CHOL 169 07/21/2023   TRIG 120 07/21/2023   HDL 53 07/21/2023   LDLCALC 95 07/21/2023   ALT 32 10/28/2023   AST 39 10/28/2023   NA 137 10/28/2023   K 4.8 10/28/2023   CL 101 10/28/2023   CREATININE 1.04 (H) 10/28/2023   BUN 24 10/28/2023   CO2 21 10/28/2023   TSH 1.420 10/28/2023   HGBA1C 6.1 (A) 10/24/2023   HGBA1C 6.1 10/24/2023   MICROALBUR 30 07/21/2020      Assessment & Plan:  Type 2 diabetes mellitus with other specified complication, with long-term current use of insulin  (HCC) Assessment & Plan: Type 2 diabetes with fluctuating glucose levels. Recent switch from Victoza  to Tresiba . Weight stable despite recent fluctuations. - Check fasting blood glucose today. - Attempt to run A1c in office. - Continue Tresiba  12 units daily. - Continue metformin . - Monitor blood glucose levels before and after meals. - Schedule lab visit for comprehensive blood work including A1c if not done today. Lab Results  Component Value Date   HGBA1C 6.1 (A) 10/24/2023   HGBA1C 6.1 10/24/2023   HGBA1C 7.9 (H) 07/21/2023     Orders: -     Gabapentin ; Take 1 capsule (300 mg total) by mouth 3 (three) times daily.  Dispense: 270 capsule; Refill: 2 -     Tresiba  FlexTouch; Inject 12 Units into the skin in the morning and at bedtime.  Dispense: 9 mL; Refill: 3 -     POCT glycosylated hemoglobin (Hb A1C)  Hypertension associated with diabetes (HCC) Assessment & Plan: Well controlled.  No changes to medicines.  Continue to work on eating a healthy diet and exercise.  Labs ordered today.    Orders: -     Metoprolol  Tartrate; Take 1 tablet (25 mg total) by mouth 2 (two) times daily.  Dispense: 180 tablet; Refill: 2 -     Comprehensive metabolic panel with GFR; Future -     CBC with Differential/Platelet; Future -     T4, free; Future -     TSH; Future  Age-related osteoporosis without current pathological fracture Assessment &  Plan: Controlled Continue taking Fosamax  70mg  Denies any side effects or issues   Orders: -     Alendronate  Sodium; Take 1 tablet (70 mg total) by mouth once a week. Take with a full glass of water on an empty stomach.  Dispense: 12 tablet; Refill: 3  Diffuse abdominal pain Assessment & Plan: The current medical regimen is effective;  continue present plan and medications.  Pantoprazole  40 mg daily.  Orders: -     Pantoprazole  Sodium; Take 1 tablet (40 mg total) by mouth daily.  Dispense: 90 tablet; Refill: 2  Mixed hyperlipidemia Assessment & Plan: Well controlled.  Continue to work on eating a healthy diet and exercise.  Labs ordered today.   No major side effects reported, and no issues with compliance. The current medical regimen is effective;  continue present plan with Zetia  10mg  Will adjust medication as needed depending on labs   Orders: -  Ezetimibe ; Take 1 tablet (10 mg total) by mouth daily.  Dispense: 90 tablet; Refill: 3 -     POCT Lipid Panel  Seasonal allergic rhinitis, unspecified trigger Assessment & Plan: The current medical regimen is effective;  continue present plan and medications.   Orders: -     Fluticasone  Propionate; Place 2 sprays into both nostrils daily.  Dispense: 16 g; Refill: 6 -     Azithromycin ; 2 DAILY FOR FIRST DAY, THEN DECREASE TO ONE DAILY FOR 4 MORE DAYS.  Dispense: 6 tablet; Refill: 0  Vitamin D  deficiency -     Vitamin D  (Ergocalciferol ); Take 1 capsule (50,000 Units total) by mouth every 7 (seven) days.  Dispense: 12 capsule; Refill: 6  Chronic kidney disease, stage 3a (HCC) Assessment & Plan: Chronic kidney disease with hyperkalemia risk. Monitoring kidney function is essential. - Schedule lab visit for kidney function tests. - Ensure adequate hydration.   Other specified hearing loss of right ear, unspecified hearing status on contralateral side Assessment & Plan: Right ear hearing loss with ear tube placement. No  pain, some swelling. ENT evaluation scheduled. - Attend ENT appointment on August 4th.   Healing wound Assessment & Plan: Healing skin wound on arm from fall. Improving with no infection signs. - Keep Band-Aid on wound to prevent irritation and promote healing.   Right lower lobe pulmonary nodule Assessment & Plan: Previous imaging consistent with March scan.       Meds ordered this encounter  Medications   alendronate  (FOSAMAX ) 70 MG tablet    Sig: Take 1 tablet (70 mg total) by mouth once a week. Take with a full glass of water on an empty stomach.    Dispense:  12 tablet    Refill:  3   pantoprazole  (PROTONIX ) 40 MG tablet    Sig: Take 1 tablet (40 mg total) by mouth daily.    Dispense:  90 tablet    Refill:  2   metoprolol  tartrate (LOPRESSOR ) 25 MG tablet    Sig: Take 1 tablet (25 mg total) by mouth 2 (two) times daily.    Dispense:  180 tablet    Refill:  2   ezetimibe  (ZETIA ) 10 MG tablet    Sig: Take 1 tablet (10 mg total) by mouth daily.    Dispense:  90 tablet    Refill:  3   fluticasone  (FLONASE ) 50 MCG/ACT nasal spray    Sig: Place 2 sprays into both nostrils daily.    Dispense:  16 g    Refill:  6   gabapentin  (NEURONTIN ) 300 MG capsule    Sig: Take 1 capsule (300 mg total) by mouth 3 (three) times daily.    Dispense:  270 capsule    Refill:  2   Vitamin D , Ergocalciferol , (DRISDOL ) 1.25 MG (50000 UNIT) CAPS capsule    Sig: Take 1 capsule (50,000 Units total) by mouth every 7 (seven) days.    Dispense:  12 capsule    Refill:  6   TRESIBA  FLEXTOUCH 200 UNIT/ML FlexTouch Pen    Sig: Inject 12 Units into the skin in the morning and at bedtime.    Dispense:  9 mL    Refill:  3   azithromycin  (ZITHROMAX ) 250 MG tablet    Sig: 2 DAILY FOR FIRST DAY, THEN DECREASE TO ONE DAILY FOR 4 MORE DAYS.    Dispense:  6 tablet    Refill:  0    Orders Placed This Encounter  Procedures  Comprehensive metabolic panel with GFR   CBC with Differential/Platelet    T4, free   TSH   POCT glycosylated hemoglobin (Hb A1C)   POCT Lipid Panel     Follow-up: Return in about 3 months (around 01/24/2024) for Chronic, Rebecca.   I,Marla I Leal-Borjas,acting as a scribe for [employer], PA.,have documented all relevant documentation on the behalf of Rebecca Angles, PA,as directed by  Rebecca Angles, PA while in the presence of Rebecca Hodge, Rebecca Hodge.   An After Visit Summary was printed and given to the patient.  Rebecca Hodge, Rebecca Hodge [facility] [phone]

## 2023-10-24 NOTE — Patient Instructions (Signed)
 VISIT SUMMARY:  Today, you came in for a follow-up visit to discuss your blood sugar management and other health concerns. We reviewed your current medications, recent blood sugar readings, and dietary habits. We also addressed your concerns about weight loss, headaches, ear health, and a healing skin wound on your arm.  YOUR PLAN:  -TYPE 2 DIABETES MELLITUS: Type 2 diabetes is a condition where your body does not use insulin  properly, leading to high blood sugar levels. We will check your fasting blood glucose today and attempt to run an A1c test in the office. Continue taking Tresiba  at 12 units daily and metformin . Please monitor your blood glucose levels before and after meals. We will schedule a lab visit for comprehensive blood work, including an A1c test if it is not done today.  -CHRONIC KIDNEY DISEASE: Chronic kidney disease is a condition where your kidneys are damaged and cannot filter blood as well as they should. We need to monitor your kidney function closely. Please schedule a lab visit for kidney function tests and ensure you stay adequately hydrated.  -HEADACHE: Your intermittent headaches may be due to sinus drainage. No new medications are needed at this time.  -RIGHT EAR HEARING LOSS: You have hearing loss in your right ear and an ear tube in place. There is no pain, but some swelling. Please attend your ENT appointment on August 4th for further evaluation.  -HEALING SKIN WOUND OF ARM: You have a healing skin wound on your arm from a fall. It is improving with no signs of infection. Keep a Band-Aid on the wound to prevent irritation and promote healing.  -PULMONARY NODULE: A pulmonary nodule is a small growth in the lung. Your recent imaging is consistent with the scan from March, and no new action is needed at this time.  -GENERAL HEALTH MAINTENANCE: We discussed your dietary habits and hydration. Please continue your current diet to prevent weight loss and ensure you stay  adequately hydrated.  INSTRUCTIONS:  Please monitor your blood glucose levels before and after meals and continue taking your medications as prescribed. Schedule a lab visit for comprehensive blood work, including kidney function tests and an A1c test if it is not done today. Attend your ENT appointment on August 4th for your ear evaluation.

## 2023-10-24 NOTE — Assessment & Plan Note (Signed)
 Controlled Continue taking Fosamax  70mg  Denies any side effects or issues

## 2023-10-25 ENCOUNTER — Ambulatory Visit: Payer: Self-pay | Admitting: Family Medicine

## 2023-10-28 ENCOUNTER — Other Ambulatory Visit

## 2023-10-28 DIAGNOSIS — E1159 Type 2 diabetes mellitus with other circulatory complications: Secondary | ICD-10-CM

## 2023-10-28 DIAGNOSIS — I152 Hypertension secondary to endocrine disorders: Secondary | ICD-10-CM | POA: Diagnosis not present

## 2023-10-29 LAB — COMPREHENSIVE METABOLIC PANEL WITH GFR
ALT: 32 IU/L (ref 0–32)
AST: 39 IU/L (ref 0–40)
Albumin: 4.1 g/dL (ref 3.7–4.7)
Alkaline Phosphatase: 54 IU/L (ref 44–121)
BUN/Creatinine Ratio: 23 (ref 12–28)
BUN: 24 mg/dL (ref 8–27)
Bilirubin Total: 0.6 mg/dL (ref 0.0–1.2)
CO2: 21 mmol/L (ref 20–29)
Calcium: 9.5 mg/dL (ref 8.7–10.3)
Chloride: 101 mmol/L (ref 96–106)
Creatinine, Ser: 1.04 mg/dL — ABNORMAL HIGH (ref 0.57–1.00)
Globulin, Total: 2.1 g/dL (ref 1.5–4.5)
Glucose: 75 mg/dL (ref 70–99)
Potassium: 4.8 mmol/L (ref 3.5–5.2)
Sodium: 137 mmol/L (ref 134–144)
Total Protein: 6.2 g/dL (ref 6.0–8.5)
eGFR: 53 mL/min/1.73 — ABNORMAL LOW (ref >59)

## 2023-10-29 LAB — CBC WITH DIFFERENTIAL/PLATELET
Basophils Absolute: 0.1 x10E3/uL (ref 0.0–0.2)
Basos: 1 %
EOS (ABSOLUTE): 0.1 x10E3/uL (ref 0.0–0.4)
Eos: 2 %
Hematocrit: 39.9 % (ref 34.0–46.6)
Hemoglobin: 12.3 g/dL (ref 11.1–15.9)
Immature Grans (Abs): 0 x10E3/uL (ref 0.0–0.1)
Immature Granulocytes: 0 %
Lymphocytes Absolute: 3 x10E3/uL (ref 0.7–3.1)
Lymphs: 45 %
MCH: 28.9 pg (ref 26.6–33.0)
MCHC: 30.8 g/dL — ABNORMAL LOW (ref 31.5–35.7)
MCV: 94 fL (ref 79–97)
Monocytes Absolute: 0.5 x10E3/uL (ref 0.1–0.9)
Monocytes: 8 %
Neutrophils Absolute: 2.9 x10E3/uL (ref 1.4–7.0)
Neutrophils: 44 %
Platelets: 216 x10E3/uL (ref 150–450)
RBC: 4.25 x10E6/uL (ref 3.77–5.28)
RDW: 14 % (ref 11.7–15.4)
WBC: 6.6 x10E3/uL (ref 3.4–10.8)

## 2023-10-29 LAB — T4, FREE: Free T4: 1.17 ng/dL (ref 0.82–1.77)

## 2023-10-29 LAB — TSH: TSH: 1.42 u[IU]/mL (ref 0.450–4.500)

## 2023-10-30 DIAGNOSIS — T1490XD Injury, unspecified, subsequent encounter: Secondary | ICD-10-CM | POA: Insufficient documentation

## 2023-10-30 DIAGNOSIS — H9191 Unspecified hearing loss, right ear: Secondary | ICD-10-CM | POA: Insufficient documentation

## 2023-10-30 DIAGNOSIS — N1831 Chronic kidney disease, stage 3a: Secondary | ICD-10-CM | POA: Insufficient documentation

## 2023-10-30 NOTE — Assessment & Plan Note (Signed)
 Well controlled.  Continue to work on eating a healthy diet and exercise.  Labs ordered today.   No major side effects reported, and no issues with compliance. The current medical regimen is effective;  continue present plan with Zetia  10mg  Will adjust medication as needed depending on labs

## 2023-10-30 NOTE — Assessment & Plan Note (Signed)
 Chronic kidney disease with hyperkalemia risk. Monitoring kidney function is essential. - Schedule lab visit for kidney function tests. - Ensure adequate hydration.

## 2023-10-30 NOTE — Assessment & Plan Note (Signed)
 Right ear hearing loss with ear tube placement. No pain, some swelling. ENT evaluation scheduled. - Attend ENT appointment on August 4th.

## 2023-10-30 NOTE — Assessment & Plan Note (Signed)
 The current medical regimen is effective;  continue present plan and medications.

## 2023-10-30 NOTE — Assessment & Plan Note (Signed)
 Healing skin wound on arm from fall. Improving with no infection signs. - Keep Band-Aid on wound to prevent irritation and promote healing.

## 2023-10-30 NOTE — Assessment & Plan Note (Signed)
Well controlled.  No changes to medicines.  Continue to work on eating a healthy diet and exercise.  Labs ordered today.

## 2023-10-30 NOTE — Assessment & Plan Note (Signed)
The current medical regimen is effective;  continue present plan and medications. Pantoprazole 40 mg daily. 

## 2023-10-30 NOTE — Assessment & Plan Note (Signed)
 Previous imaging consistent with March scan.

## 2023-11-05 ENCOUNTER — Other Ambulatory Visit: Payer: Self-pay | Admitting: Physician Assistant

## 2023-11-05 DIAGNOSIS — R0781 Pleurodynia: Secondary | ICD-10-CM

## 2023-12-14 ENCOUNTER — Ambulatory Visit: Payer: Self-pay

## 2023-12-14 NOTE — Telephone Encounter (Signed)
 FYI Only or Action Required?: FYI only for provider.  Patient was last seen in primary care on 10/24/2023 by Milon Cleaves, PA.  Called Nurse Triage reporting Generalized Body Aches.  Symptoms began several days ago.  Interventions attempted: OTC medications: dayquil and Rest, hydration, or home remedies.  Symptoms are: gradually worsening.  Triage Disposition: Call PCP Within 24 Hours  Patient/caregiver understands and will follow disposition?: Yes     Copied from CRM #[id number]. Topic: Clinical - Red Word Triage >> Dec 14, 2023  9:37 AM Mia F wrote: Red Word that prompted transfer to Nurse Triage: complaining of body aches all over. She is complaining of hurting everywhere. Turning pale. No fever, very congested, postnasal drip causing sore throat, stomach ache. No SOB or chest pain Reason for Disposition  Diabetes mellitus or weak immune system (e.g., HIV positive, cancer chemo, splenectomy, organ transplant, chronic steroids)  Answer Assessment - Initial Assessment Questions 1. ONSET: When did the muscle aches or body pains start?      Sunday 2. LOCATION: What part of your body is hurting? (e.g., entire body, arms, legs)      generalized 3. SEVERITY: How bad is the pain? (Scale 1-10; or mild, moderate, severe)      4. CAUSE: What do you think is causing the pains?     Dtr thinks that it's the flu 5. FEVER: Do you have a fever? If Yes, ask: What is your temperature, how was it measured, and  when did it start?      No measured temperature 6. OTHER SYMPTOMS: Do you have any other symptoms? (e.g., chest pain, cold or flu symptoms, rash, weakness, weight loss)     Cold/flu symptoms: no fever, no shortness of breath. Yes nasal congestion, slight diarrhea (yesterday), body aches, sinus pain/pressure, sore throat, slight cough, abdominal pain.  Protocols used: Muscle Aches and Body Pain-A-AH

## 2023-12-14 NOTE — Progress Notes (Signed)
 "  Acute Office Visit  Subjective:    Patient ID: Rebecca Hodge, female    DOB: 08-Jan-1941, 83 y.o.   MRN: [id number]  Chief Complaint  Patient presents with   Runny nose/neck hurts    HPI: Patient is in today for sinus congestion  Discussed the use of AI scribe software for clinical note transcription with the patient, who gave verbal consent to proceed.  History of Present Illness Rebecca Hodge is an 83 year old female who presents with body aches, sinus congestion, and sore throat.  She has been experiencing body aches, sinus congestion, and a sore throat for the past week. The body aches are described as 'sore all over,' affecting her neck, back, and shoulders. She also experiences alternating sensations of feeling cold and then hot.  She has been coughing and has lost three pounds, despite maintaining a good appetite and eating three meals a day. She enjoys foods like Chinese food and pizza but acknowledges the need for a varied diet. She also reports having diarrhea, which she believes may have contributed to her weight loss.  She received a flu shot last month and was not sick at the time. Her nose has been sore and swollen. She describes her lymph nodes as being 'real sore.'  She recalls having a similar illness in the spring, for which she was treated with azithromycin  (Z-Pak), which was effective. She is currently taking a new medication for her sinuses, which she just started.       Past Medical History:  Diagnosis Date   Age-related osteoporosis without current pathological fracture 06/18/2019   Allergic rhinitis    BMI 27.0-27.9,adult 08/17/2019   Diabetes (HCC)    Dysphasia following cerebral infarction 06/18/2019   Hyperlipidemia    Hypertension    Mixed hyperlipidemia 06/18/2019   Mixed incontinence 06/18/2019   Sequelae of poliomyelitis 06/18/2019   Type 2 diabetes mellitus with other specified complication (HCC) 06/18/2019    Past Surgical History:  Procedure  Laterality Date   ABDOMINAL HYSTERECTOMY     CATARACT EXTRACTION Bilateral    CHOLECYSTECTOMY     ROTATOR CUFF REPAIR Bilateral     History reviewed. No pertinent family history.  Social History   Socioeconomic History   Marital status: Widowed    Spouse name: Not on file   Number of children: Not on file   Years of education: Not on file   Highest education level: 8th grade  Occupational History   Occupation: Disabled  Tobacco Use   Smoking status: Former    Current packs/day: 0.00    Average packs/day: 1 pack/day for 5.0 years (5.0 ttl pk-yrs)    Types: Cigarettes    Start date: 03/15/1982    Quit date: 03/16/1987    Years since quitting: 36.7   Smokeless tobacco: Never  Vaping Use   Vaping status: Never Used  Substance and Sexual Activity   Alcohol use: No   Drug use: No   Sexual activity: Not Currently  Other Topics Concern   Not on file  Social History Narrative   Not on file   Social Drivers of Health   Financial Resource Strain: Low Risk  (09/07/2022)   Overall Financial Resource Strain (CARDIA)    Difficulty of Paying Living Expenses: Not hard at all  Food Insecurity: No Food Insecurity (09/07/2022)   Hunger Vital Sign    Worried About Running Out of Food in the Last Year: Never true    Ran Out of  Food in the Last Year: Never true  Transportation Needs: No Transportation Needs (09/07/2022)   PRAPARE - Administrator, Civil Service (Medical): No    Lack of Transportation (Non-Medical): No  Physical Activity: Sufficiently Active (09/07/2022)   Exercise Vital Sign    Days of Exercise per Week: 5 days    Minutes of Exercise per Session: 30 min  Stress: No Stress Concern Present (09/07/2022)   [employer] of Occupational Health - Occupational Stress Questionnaire    Feeling of Stress : Not at all  Social Connections: Moderately Isolated (09/07/2022)   Social Connection and Isolation Panel    Frequency of Communication with Friends and Family:  Three times a week    Frequency of Social Gatherings with Friends and Family: Twice a week    Attends Religious Services: More than 4 times per year    Active Member of [employer] or Organizations: No    Attends Banker Meetings: Never    Marital Status: Widowed  Intimate Partner Violence: Not At Risk (09/07/2022)   Humiliation, Afraid, Rape, and Kick questionnaire    Fear of Current or Ex-Partner: No    Emotionally Abused: No    Physically Abused: No    Sexually Abused: No    Outpatient Medications Prior to Visit  Medication Sig Dispense Refill   alendronate  (FOSAMAX ) 70 MG tablet Take 1 tablet (70 mg total) by mouth once a week. Take with a full glass of water on an empty stomach. 12 tablet 3   BD PEN NEEDLE NANO U/F 32G X 4 MM MISC Use new needle with each injection 50 each 1   Blood Glucose Monitoring Suppl (ACCU-CHEK GUIDE) w/Device KIT 1 each by Other route daily. 1 kit 0   clotrimazole-betamethasone (LOTRISONE) cream Apply topically.     estradiol  (ESTRACE ) 0.1 MG/GM vaginal cream INSERT 1 APPLICATORFUL VAGINALLY 3 TIMES A WEEK 42.5 g 3   fluticasone  (FLONASE ) 50 MCG/ACT nasal spray Place 2 sprays into both nostrils daily. 16 g 6   gabapentin  (NEURONTIN ) 300 MG capsule Take 1 capsule (300 mg total) by mouth 3 (three) times daily. 270 capsule 2   glucose blood (ACCU-CHEK GUIDE TEST) test strip Check FBS daily 100 each 12   Lancets (ONETOUCH ULTRASOFT) lancets 1 each by Other route daily. Use as instructed 100 each 2   liraglutide  (VICTOZA ) 18 MG/3ML SOPN Inject 1.2 mg into the skin daily. 9 mL 1   lisinopril  (ZESTRIL ) 20 MG tablet TAKE 1 TABLET(20 MG) BY MOUTH DAILY 90 tablet 1   loratadine  (CLARITIN ) 10 MG tablet Take 10 mg by mouth daily.     metoprolol  tartrate (LOPRESSOR ) 25 MG tablet Take 1 tablet (25 mg total) by mouth 2 (two) times daily. (Patient taking differently: Take 25 mg by mouth daily.) 180 tablet 2   mupirocin  ointment (BACTROBAN ) 2 % Apply topically 2 (two)  times daily.     pantoprazole  (PROTONIX ) 40 MG tablet Take 1 tablet (40 mg total) by mouth daily. 90 tablet 2   rosuvastatin  (CRESTOR ) 40 MG tablet Take 1 tablet (40 mg total) by mouth at bedtime. 90 tablet 2   TRESIBA  FLEXTOUCH 200 UNIT/ML FlexTouch Pen Inject 12 Units into the skin in the morning and at bedtime. 9 mL 3   Vitamin D , Ergocalciferol , (DRISDOL ) 1.25 MG (50000 UNIT) CAPS capsule Take 1 capsule (50,000 Units total) by mouth every 7 (seven) days. 12 capsule 6   levocetirizine (XYZAL  ALLERGY 24HR) 5 MG tablet Take 1 tablet (  5 mg total) by mouth every evening. 90 tablet 3   azithromycin  (ZITHROMAX ) 250 MG tablet 2 DAILY FOR FIRST DAY, THEN DECREASE TO ONE DAILY FOR 4 MORE DAYS. 6 tablet 0   ezetimibe  (ZETIA ) 10 MG tablet Take 1 tablet (10 mg total) by mouth daily. 90 tablet 3   nitrofurantoin  (MACRODANTIN ) 50 MG capsule Take 50 mg by mouth at bedtime.     No facility-administered medications prior to visit.    Allergies  Allergen Reactions   Codeine    Morphine And Codeine    Cipro  [Ciprofloxacin  Hcl] Nausea Only    Patient can't not tolerate 500 mg   Lantus  [Insulin  Glargine] Itching   Sulfa Antibiotics Itching    Review of Systems  Constitutional:  Negative for appetite change, fatigue and fever.  HENT:  Positive for rhinorrhea. Negative for congestion, ear pain, sinus pressure and sore throat.   Respiratory:  Negative for cough, chest tightness, shortness of breath and wheezing.   Cardiovascular:  Negative for chest pain and palpitations.  Gastrointestinal:  Negative for abdominal pain, constipation, diarrhea, nausea and vomiting.  Genitourinary:  Negative for dysuria and hematuria.  Musculoskeletal:  Positive for neck pain. Negative for arthralgias, back pain, joint swelling and myalgias.  Skin:  Negative for rash.  Neurological:  Negative for dizziness, weakness and headaches.  Psychiatric/Behavioral:  Negative for dysphoric mood. The patient is not nervous/anxious.         Objective:        12/15/2023    9:08 AM 10/24/2023    9:21 AM 08/05/2023    8:55 AM  Vitals with BMI  Height 5' 2 5' 2 5' 2  Weight 120 lbs 123 lbs 123 lbs  BMI 21.94 22.49 22.49  Systolic 118 130 889  Diastolic 72 64 72  Pulse 72 66 62    Orthostatic VS for the past 72 hrs (Last 3 readings):  Patient Position BP Location  12/15/23 0908 Sitting Left Arm     Physical Exam Vitals reviewed.  Constitutional:      Appearance: Normal appearance.  HENT:     Right Ear: Ear canal and external ear normal.     Left Ear: Ear canal and external ear normal.     Nose: Congestion and rhinorrhea present.     Mouth/Throat:     Mouth: Mucous membranes are moist.     Pharynx: Posterior oropharyngeal erythema present. No oropharyngeal exudate.  Neck:     Vascular: No carotid bruit.  Cardiovascular:     Rate and Rhythm: Normal rate and regular rhythm.     Heart sounds: Normal heart sounds.  Pulmonary:     Effort: Pulmonary effort is normal.     Breath sounds: Normal breath sounds.  Abdominal:     General: Bowel sounds are normal.     Palpations: Abdomen is soft.     Tenderness: There is no abdominal tenderness.  Neurological:     Mental Status: She is alert and oriented to person, place, and time.  Psychiatric:        Mood and Affect: Mood normal.        Behavior: Behavior normal.     Health Maintenance Due  Topic Date Due   DTaP/Tdap/Td (1 - Tdap) Never done   OPHTHALMOLOGY EXAM  03/19/2021   Mammogram  07/19/2023   Medicare Annual Wellness (AWV)  09/07/2023   Influenza Vaccine  10/14/2023    There are no preventive care reminders to display for this patient.  Lab Results  Component Value Date   TSH 1.420 10/28/2023   Lab Results  Component Value Date   WBC 6.6 10/28/2023   HGB 12.3 10/28/2023   HCT 39.9 10/28/2023   MCV 94 10/28/2023   PLT 216 10/28/2023   Lab Results  Component Value Date   NA 137 10/28/2023   K 4.8 10/28/2023   CO2 21  10/28/2023   GLUCOSE 75 10/28/2023   BUN 24 10/28/2023   CREATININE 1.04 (H) 10/28/2023   BILITOT 0.6 10/28/2023   ALKPHOS 54 10/28/2023   AST 39 10/28/2023   ALT 32 10/28/2023   PROT 6.2 10/28/2023   ALBUMIN 4.1 10/28/2023   CALCIUM  9.5 10/28/2023   EGFR 53 (L) 10/28/2023   Lab Results  Component Value Date   CHOL 169 07/21/2023   Lab Results  Component Value Date   HDL 53 07/21/2023   Lab Results  Component Value Date   LDLCALC 95 07/21/2023   Lab Results  Component Value Date   TRIG 120 07/21/2023   Lab Results  Component Value Date   CHOLHDL 3.2 07/21/2023   Lab Results  Component Value Date   HGBA1C 6.1 (A) 10/24/2023   HGBA1C 6.1 10/24/2023        Results for orders placed or performed in visit on 10/28/23  TSH   Collection Time: 10/28/23  8:45 AM  Result Value Ref Range   TSH 1.420 0.450 - 4.500 uIU/mL  T4, free   Collection Time: 10/28/23  8:45 AM  Result Value Ref Range   Free T4 1.17 0.82 - 1.77 ng/dL  Comprehensive metabolic panel with GFR   Collection Time: 10/28/23  8:45 AM  Result Value Ref Range   Glucose 75 70 - 99 mg/dL   BUN 24 8 - 27 mg/dL   Creatinine, Ser 8.95 (H) 0.57 - 1.00 mg/dL   eGFR 53 (L) >40 fO/fpw/8.26   BUN/Creatinine Ratio 23 12 - 28   Sodium 137 134 - 144 mmol/L   Potassium 4.8 3.5 - 5.2 mmol/L   Chloride 101 96 - 106 mmol/L   CO2 21 20 - 29 mmol/L   Calcium  9.5 8.7 - 10.3 mg/dL   Total Protein 6.2 6.0 - 8.5 g/dL   Albumin 4.1 3.7 - 4.7 g/dL   Globulin, Total 2.1 1.5 - 4.5 g/dL   Bilirubin Total 0.6 0.0 - 1.2 mg/dL   Alkaline Phosphatase 54 44 - 121 IU/L   AST 39 0 - 40 IU/L   ALT 32 0 - 32 IU/L  CBC with Differential/Platelet   Collection Time: 10/28/23  8:45 AM  Result Value Ref Range   WBC 6.6 3.4 - 10.8 x10E3/uL   RBC 4.25 3.77 - 5.28 x10E6/uL   Hemoglobin 12.3 11.1 - 15.9 g/dL   Hematocrit 60.0 65.9 - 46.6 %   MCV 94 79 - 97 fL   MCH 28.9 26.6 - 33.0 pg   MCHC 30.8 (L) 31.5 - 35.7 g/dL   RDW 85.9  88.2 - 84.5 %   Platelets 216 150 - 450 x10E3/uL   Neutrophils 44 Not Estab. %   Lymphs 45 Not Estab. %   Monocytes 8 Not Estab. %   Eos 2 Not Estab. %   Basos 1 Not Estab. %   Neutrophils Absolute 2.9 1.4 - 7.0 x10E3/uL   Lymphocytes Absolute 3.0 0.7 - 3.1 x10E3/uL   Monocytes Absolute 0.5 0.1 - 0.9 x10E3/uL   EOS (ABSOLUTE) 0.1 0.0 - 0.4 x10E3/uL   Basophils Absolute  0.1 0.0 - 0.2 x10E3/uL   Immature Granulocytes 0 Not Estab. %   Immature Grans (Abs) 0.0 0.0 - 0.1 x10E3/uL     Assessment & Plan:   Assessment & Plan Acute non-recurrent maxillary sinusitis Acute upper respiratory infection with sinus involvement Symptoms suggest viral sinusitis, but bacterial component possible due to duration. Previous episode responded to azithromycin . - Prescribed azithromycin  (Z-Pak). - Continue sinus medication. - Encouraged hydration with water and electrolyte solutions. - Advised balanced meals. - Return if no improvement. Orders:   azithromycin  (ZITHROMAX ) 250 MG tablet; Take 2 tablets on day 1, then 1 tablet daily on days 2 through 5     Body mass index is 21.95 kg/m..   Meds ordered this encounter  Medications   azithromycin  (ZITHROMAX ) 250 MG tablet    Sig: Take 2 tablets on day 1, then 1 tablet daily on days 2 through 5    Dispense:  6 tablet    Refill:  0    No orders of the defined types were placed in this encounter.    Follow-up: Return if symptoms worsen or fail to improve.  An After Visit Summary was printed and given to the patient.   I,Lauren M Auman,acting as a neurosurgeon for [employer].,have documented all relevant documentation on the behalf of Nola Angles, PA,as directed by  Nola Angles, PA while in the presence of Nola Angles, [place].    Nola Angles, [place] [facility] [phone] "

## 2023-12-15 ENCOUNTER — Encounter: Payer: Self-pay | Admitting: Physician Assistant

## 2023-12-15 ENCOUNTER — Ambulatory Visit: Admitting: Physician Assistant

## 2023-12-15 VITALS — BP 118/72 | HR 72 | Temp 97.8°F | Ht 62.0 in | Wt 120.0 lb

## 2023-12-15 DIAGNOSIS — J0100 Acute maxillary sinusitis, unspecified: Secondary | ICD-10-CM

## 2023-12-15 MED ORDER — AZITHROMYCIN 250 MG PO TABS: ORAL_TABLET | ORAL | 0 refills | Status: AC

## 2023-12-15 NOTE — Assessment & Plan Note (Signed)
 Acute upper respiratory infection with sinus involvement Symptoms suggest viral sinusitis, but bacterial component possible due to duration. Previous episode responded to azithromycin . - Prescribed azithromycin  (Z-Pak). - Continue sinus medication. - Encouraged hydration with water and electrolyte solutions. - Advised balanced meals. - Return if no improvement. Orders:   azithromycin  (ZITHROMAX ) 250 MG tablet; Take 2 tablets on day 1, then 1 tablet daily on days 2 through 5

## 2023-12-16 ENCOUNTER — Ambulatory Visit: Admitting: Physician Assistant

## 2023-12-30 ENCOUNTER — Telehealth: Payer: Self-pay | Admitting: Physician Assistant

## 2023-12-30 NOTE — Telephone Encounter (Signed)
 Called to reschedule the appointment for 02/23/24 due to the provider being out of office. Unable to reach patient or leave a voicemail.

## 2024-01-03 ENCOUNTER — Other Ambulatory Visit: Payer: Self-pay | Admitting: Physician Assistant

## 2024-01-19 ENCOUNTER — Ambulatory Visit (INDEPENDENT_AMBULATORY_CARE_PROVIDER_SITE_OTHER): Admitting: Physician Assistant

## 2024-01-19 VITALS — BP 94/62 | HR 63 | Temp 97.7°F | Resp 18 | Ht 62.0 in | Wt 121.8 lb

## 2024-01-19 DIAGNOSIS — R911 Solitary pulmonary nodule: Secondary | ICD-10-CM | POA: Diagnosis not present

## 2024-01-19 DIAGNOSIS — Z794 Long term (current) use of insulin: Secondary | ICD-10-CM | POA: Diagnosis not present

## 2024-01-19 DIAGNOSIS — R32 Unspecified urinary incontinence: Secondary | ICD-10-CM

## 2024-01-19 DIAGNOSIS — I10 Essential (primary) hypertension: Secondary | ICD-10-CM | POA: Diagnosis not present

## 2024-01-19 DIAGNOSIS — E782 Mixed hyperlipidemia: Secondary | ICD-10-CM

## 2024-01-19 DIAGNOSIS — E1165 Type 2 diabetes mellitus with hyperglycemia: Secondary | ICD-10-CM | POA: Diagnosis not present

## 2024-01-19 DIAGNOSIS — Z0000 Encounter for general adult medical examination without abnormal findings: Secondary | ICD-10-CM | POA: Diagnosis not present

## 2024-01-19 DIAGNOSIS — R634 Abnormal weight loss: Secondary | ICD-10-CM

## 2024-01-19 LAB — POCT URINALYSIS DIP (CLINITEK)
Bilirubin, UA: NEGATIVE
Glucose, UA: NEGATIVE mg/dL
Ketones, POC UA: NEGATIVE mg/dL
Leukocytes, UA: NEGATIVE
Nitrite, UA: NEGATIVE
Spec Grav, UA: 1.015 (ref 1.010–1.025)
Urobilinogen, UA: 0.2 U/dL
pH, UA: 6 (ref 5.0–8.0)

## 2024-01-19 NOTE — Progress Notes (Signed)
 Subjective:   Rebecca Hodge is a 83 y.o. female who presents for a Medicare Annual Wellness Visit.  Allergies (verified) Codeine, Morphine and codeine, Cipro  [ciprofloxacin  hcl], Lantus  [insulin  glargine], and Sulfa antibiotics   History: Past Medical History:  Diagnosis Date   Age-related osteoporosis without current pathological fracture 06/18/2019   Allergic rhinitis    BMI 27.0-27.9,adult 08/17/2019   Diabetes (HCC)    Dysphasia following cerebral infarction 06/18/2019   Hyperlipidemia    Hypertension    Mixed hyperlipidemia 06/18/2019   Mixed incontinence 06/18/2019   Sequelae of poliomyelitis 06/18/2019   Type 2 diabetes mellitus with other specified complication (HCC) 06/18/2019   Past Surgical History:  Procedure Laterality Date   ABDOMINAL HYSTERECTOMY     CATARACT EXTRACTION Bilateral    CHOLECYSTECTOMY     ROTATOR CUFF REPAIR Bilateral    No family history on file. Social History   Occupational History   Occupation: Disabled  Tobacco Use   Smoking status: Former    Current packs/day: 0.00    Average packs/day: 1 pack/day for 5.0 years (5.0 ttl pk-yrs)    Types: Cigarettes    Start date: 03/15/1982    Quit date: 03/16/1987    Years since quitting: 36.9   Smokeless tobacco: Never  Vaping Use   Vaping status: Never Used  Substance and Sexual Activity   Alcohol use: No   Drug use: No   Sexual activity: Not Currently   Tobacco Counseling Counseling given: Not Answered  SDOH Screenings   Food Insecurity: No Food Insecurity (01/19/2024)  Housing: Unknown (01/19/2024)  Transportation Needs: No Transportation Needs (01/19/2024)  Utilities: Not At Risk (01/19/2024)  Alcohol Screen: Low Risk  (09/07/2022)  Depression (PHQ2-9): Low Risk  (02/02/2024)  Financial Resource Strain: Low Risk  (09/07/2022)  Physical Activity: Sufficiently Active (01/19/2024)  Social Connections: Moderately Isolated (01/19/2024)  Stress: No Stress Concern Present (01/19/2024)  Tobacco Use: Medium  Risk (02/01/2024)  Health Literacy: Inadequate Health Literacy (10/20/2022)   Depression Screen    02/02/2024    8:50 AM 10/24/2023    9:34 AM 07/21/2023    8:33 AM 04/22/2023    9:20 AM 10/20/2022    8:22 AM 09/07/2022    9:23 AM 07/08/2022    8:51 AM  PHQ 2/9 Scores  PHQ - 2 Score 0 0 0 0 0 0 0  PHQ- 9 Score 0 0  0  0  0   0      Data saved with a previous flowsheet row definition     Goals Addressed   None    Visit info / Clinical Intake: Medicare Wellness Visit Type:: Subsequent Annual Wellness Visit Persons participating in visit:: patient & caregiver Medicare Wellness Visit Mode:: In-person (required for WTM) Information given by:: patient; caregiver Interpreter Needed?: No Pre-visit prep was completed: yes AWV questionnaire completed by patient prior to visit?: yes Date:: 01/18/24 Living arrangements:: with family/others Patient's Overall Health Status Rating: good Typical amount of pain: none Does pain affect daily life?: no Are you currently prescribed opioids?: no  Dietary Habits and Nutritional Risks How many meals a day?: 2 Eats fruit and vegetables daily?: yes Most meals are obtained by: preparing own meals In the last 2 weeks, have you had any of the following?: none Diabetic:: (!) yes Any non-healing wounds?: no How often do you check your BS?: 1 Would you like to be referred to a Nutritionist or for Diabetic Management? : no  Functional Status Activities of Daily Living (to  include ambulation/medication): Independent Ambulation: Independent Medication Administration: Independent Home Management: Independent Manage your own finances?: yes Primary transportation is: family/friends Concerns about vision?: no *vision screening is required for WTM* Concerns about hearing?: no  Fall Screening Falls in the past year?: 0 Number of falls in past year: 0 Was there an injury with Fall?: 0 Fall Risk Category Calculator: 0 Patient Fall Risk Level: Low Fall  Risk  Fall Risk Patient at Risk for Falls Due to: No Fall Risks Fall risk Follow up: Falls evaluation completed  Home and Transportation Safety: All rugs have non-skid backing?: yes All stairs or steps have railings?: yes Grab bars in the bathtub or shower?: yes Have non-skid surface in bathtub or shower?: yes Good home lighting?: yes Regular seat belt use?: yes Hospital stays in the last year:: no  Cognitive Assessment Difficulty concentrating, remembering, or making decisions? : no Will 6CIT or Mini Cog be Completed: yes What year is it?: 0 points What month is it?: 0 points Give patient an address phrase to remember (5 components): cat, dog, tree, ball, car About what time is it?: 0 points Count backwards from 20 to 1: 2 points Say the months of the year in reverse: 0 points Repeat the address phrase from earlier: 2 points 6 CIT Score: 4 points  Advance Directives (For Healthcare) Does Patient Have a Medical Advance Directive?: No Would patient like information on creating a medical advance directive?: No - Patient declined  Reviewed/Updated  Reviewed/Updated: Reviewed All (Medical, Surgical, Family, Medications, Allergies, Care Teams, Patient Goals)        Objective:    Today's Vitals   01/19/24 0907  BP: 94/62  Pulse: 63  Resp: 18  Temp: 97.7 F (36.5 C)  TempSrc: Temporal  Weight: 121 lb 12.8 oz (55.2 kg)  Height: 5' 2 (1.575 m)  PainSc: 0-No pain   Body mass index is 22.28 kg/m.  Current Medications (verified) Outpatient Encounter Medications as of 01/19/2024  Medication Sig   alendronate  (FOSAMAX ) 70 MG tablet Take 1 tablet (70 mg total) by mouth once a week. Take with a full glass of water on an empty stomach.   BD PEN NEEDLE NANO U/F 32G X 4 MM MISC Use new needle with each injection   Blood Glucose Monitoring Suppl (ACCU-CHEK GUIDE) w/Device KIT 1 each by Other route daily.   clotrimazole-betamethasone (LOTRISONE) cream Apply topically.    estradiol  (ESTRACE ) 0.1 MG/GM vaginal cream INSERT 1 APPLICATORFUL VAGINALLY 3 TIMES A WEEK   fluticasone  (FLONASE ) 50 MCG/ACT nasal spray Place 2 sprays into both nostrils daily.   gabapentin  (NEURONTIN ) 300 MG capsule Take 1 capsule (300 mg total) by mouth 3 (three) times daily.   glucose blood (ACCU-CHEK GUIDE TEST) test strip Check FBS daily   Lancets (ONETOUCH ULTRASOFT) lancets 1 each by Other route daily. Use as instructed   liraglutide  (VICTOZA ) 18 MG/3ML SOPN ADMINISTER 1.2 MG UNDER THE SKIN DAILY   lisinopril  (ZESTRIL ) 20 MG tablet TAKE 1 TABLET(20 MG) BY MOUTH DAILY   loratadine  (CLARITIN ) 10 MG tablet Take 10 mg by mouth daily.   metoprolol  tartrate (LOPRESSOR ) 25 MG tablet Take 1 tablet (25 mg total) by mouth 2 (two) times daily. (Patient taking differently: Take 25 mg by mouth daily.)   mupirocin  ointment (BACTROBAN ) 2 % Apply topically 2 (two) times daily.   pantoprazole  (PROTONIX ) 40 MG tablet Take 1 tablet (40 mg total) by mouth daily.   rosuvastatin  (CRESTOR ) 40 MG tablet Take 1 tablet (40 mg total) by  mouth at bedtime.   TRESIBA  FLEXTOUCH 200 UNIT/ML FlexTouch Pen Inject 12 Units into the skin in the morning and at bedtime.   Vitamin D , Ergocalciferol , (DRISDOL ) 1.25 MG (50000 UNIT) CAPS capsule Take 1 capsule (50,000 Units total) by mouth every 7 (seven) days.   No facility-administered encounter medications on file as of 01/19/2024.   Hearing/Vision screen No results found. Immunizations and Health Maintenance Health Maintenance  Topic Date Due   DTaP/Tdap/Td (1 - Tdap) Never done   OPHTHALMOLOGY EXAM  03/19/2021   Mammogram  07/19/2023   Influenza Vaccine  10/14/2023   FOOT EXAM  04/21/2024   HEMOGLOBIN A1C  07/18/2024   Diabetic kidney evaluation - eGFR measurement  01/18/2025   Diabetic kidney evaluation - Urine ACR  01/18/2025   Medicare Annual Wellness (AWV)  01/18/2025   Pneumococcal Vaccine: 50+ Years  Completed   Bone Density Scan  Completed   Zoster  Vaccines- Shingrix  Completed   Meningococcal B Vaccine  Aged Out   COVID-19 Vaccine  Discontinued        Assessment/Plan:  This is a routine wellness examination for Stanislawa.  Patient Care Team: Milon Cleaves, [place] as PCP - General (Physician Assistant) Nyle Rankin POUR, [facility] (Inactive) as Pharmacist (Pharmacist)  I have personally reviewed and noted the following in the patient's chart:   Medical and social history Use of alcohol, tobacco or illicit drugs  Current medications and supplements including opioid prescriptions. Functional ability and status Nutritional status Physical activity Advanced directives List of other physicians Hospitalizations, surgeries, and ER visits in previous 12 months Vitals Screenings to include cognitive, depression, and falls Referrals and appointments  Orders Placed This Encounter  Procedures   CT CHEST ABDOMEN PELVIS W CONTRAST    Standing Status:   Future    Expiration Date:   01/18/2025    If indicated for the ordered procedure, I authorize the administration of contrast media per Radiology protocol:   Yes    Does the patient have a contrast media/X-ray dye allergy?:   No    Preferred imaging location?:   [facility]    If indicated for the ordered procedure, I authorize the administration of oral contrast media per Radiology protocol:   Yes   CBC with Differential/Platelet   Comprehensive metabolic panel with GFR   Hemoglobin A1c   Lipid panel   Microalbumin / creatinine urine ratio   Thyroid  Panel With TSH   POCT URINALYSIS DIP (CLINITEK)   In addition, I have reviewed and discussed with patient certain preventive protocols, quality metrics, and best practice recommendations. A written personalized care plan for preventive services as well as general preventive health recommendations were provided to patient.   Tinnie CHRISTELLA Clover, CMA   02/02/2024   Return in 1 year (on 01/18/2025).  After Visit Summary: (In Person-Printed)  AVS printed and given to the patient  Nurse Notes: N/A

## 2024-01-19 NOTE — Assessment & Plan Note (Addendum)
 Currently managed with Tresiba . Discussed importance of maintaining adequate nutrition and hydration to prevent complications. - Continue current diabetes management with Tresiba . - Encouraged regular meals and adequate hydration. Orders:   Hemoglobin A1c   Microalbumin / creatinine urine ratio

## 2024-01-19 NOTE — Assessment & Plan Note (Addendum)
 Previous CT scan showed a nodule on the lung. Follow-up imaging needed to assess for changes or progression. - Ordered CT scan of the chest with contrast to evaluate the pulmonary nodule. - Will assess kidney function prior to CT scan due to contrast use. Orders:   CT CHEST ABDOMEN PELVIS W CONTRAST; Future

## 2024-01-19 NOTE — Assessment & Plan Note (Addendum)
 Blood pressure lower than usual today. Possible dehydration contributing to lower blood pressure. - Encouraged adequate fluid intake to maintain blood pressure stability. Orders:   CBC with Differential/Platelet   Comprehensive metabolic panel with GFR   Thyroid  Panel With TSH

## 2024-01-19 NOTE — Progress Notes (Unsigned)
 Subjective:  Patient ID: Rebecca Hodge, female    DOB: 1940-09-03  Age: 83 y.o. MRN: [id number]  Chief Complaint  Patient presents with   Medical Management of Chronic Issues    HPI: Discussed the use of AI scribe software for clinical note transcription with the patient, who gave verbal consent to proceed.  History of Present Illness Rebecca Hodge is an 83 year old female who presents for an annual wellness visit.  She has been experiencing ongoing weight loss despite frequent eating, which she attributes to a new sinus medication. She is concerned about the weight loss and its impact on her health. She is currently on Tresiba  for diabetes management.  She experiences dizziness, particularly when transitioning from lying down to sitting up, and sometimes when bending over. Occasionally, she needs to sit down for a few minutes to recover. She also reports frequent headaches and a sensation of fullness due to a sinus infection. No fever is present.  She has a history of sinus issues, with symptoms including congestion, sore throat, and thick mucus production, particularly in the morning and at night. She has not yet used Flonase .  She reports low fluid intake, preferring sugar-free drinks over water, and acknowledges potential dehydration. She drinks fluids once or twice a day. Her family has encouraged her to increase her water intake.  She has a history of a lung nodule, identified in a previous CT scan, and recalls being told about a 'little bitty spot' on her lung many years ago, which was not considered worrisome at the time. She also mentions a history of kidney issues, with protein noted in her urine in the past, and expresses concern about her kidney function.  She describes a history of bowel issues following a surgical procedure, which she attributes to the surgery. Ongoing problems since the surgery include issues with her bladder  not being 'tacked up'.  She has a history of urinary tract infections and notes that her urine is sometimes dark. She is concerned about potential dehydration and its impact on her health.       10/24/2023    9:34 AM 07/21/2023    8:33 AM 04/22/2023    9:20 AM 10/20/2022    8:22 AM 09/07/2022    9:23 AM  Depression screen PHQ 2/9  Decreased Interest 0 0 0 0 0  Down, Depressed, Hopeless 0 0 0 0 0  PHQ - 2 Score 0 0 0 0 0  Altered sleeping 0 0 0 0   Tired, decreased energy 0 0 0 0   Change in appetite 0 0 0 0   Feeling bad or failure about yourself  0 0 0 0   Trouble concentrating 0 0 0 0   Moving slowly or fidgety/restless  0 0 0   Suicidal thoughts 0 0 0 0   PHQ-9 Score 0  0  0  0    Difficult doing work/chores Not difficult at all Not difficult at all Not difficult at all Not difficult at all      Data saved with a previous flowsheet row definition        10/24/2023    9:34 AM  Fall Risk   Falls in the past year? 0  Number falls in past yr: 0  Injury with Fall? 0  Risk for fall due to : Impaired balance/gait  Follow up Education provided    Patient Care Team: Milon Cleaves, [place] as PCP - General (Physician Assistant) Nyle Rankin POUR,  RPH (Inactive) as Pharmacist (Pharmacist)   Review of Systems  Current Outpatient Medications on File Prior to Visit  Medication Sig Dispense Refill   alendronate  (FOSAMAX ) 70 MG tablet Take 1 tablet (70 mg total) by mouth once a week. Take with a full glass of water on an empty stomach. 12 tablet 3   BD PEN NEEDLE NANO U/F 32G X 4 MM MISC Use new needle with each injection 50 each 1   Blood Glucose Monitoring Suppl (ACCU-CHEK GUIDE) w/Device KIT 1 each by Other route daily. 1 kit 0   clotrimazole-betamethasone (LOTRISONE) cream Apply topically.     estradiol  (ESTRACE ) 0.1 MG/GM vaginal cream INSERT 1 APPLICATORFUL VAGINALLY 3 TIMES A WEEK 42.5 g 3   fluticasone  (FLONASE ) 50 MCG/ACT nasal spray Place 2 sprays into both nostrils daily. 16 g 6    gabapentin  (NEURONTIN ) 300 MG capsule Take 1 capsule (300 mg total) by mouth 3 (three) times daily. 270 capsule 2   glucose blood (ACCU-CHEK GUIDE TEST) test strip Check FBS daily 100 each 12   Lancets (ONETOUCH ULTRASOFT) lancets 1 each by Other route daily. Use as instructed 100 each 2   liraglutide  (VICTOZA ) 18 MG/3ML SOPN ADMINISTER 1.2 MG UNDER THE SKIN DAILY 9 mL 1   lisinopril  (ZESTRIL ) 20 MG tablet TAKE 1 TABLET(20 MG) BY MOUTH DAILY 90 tablet 1   loratadine  (CLARITIN ) 10 MG tablet Take 10 mg by mouth daily.     metoprolol  tartrate (LOPRESSOR ) 25 MG tablet Take 1 tablet (25 mg total) by mouth 2 (two) times daily. (Patient taking differently: Take 25 mg by mouth daily.) 180 tablet 2   mupirocin  ointment (BACTROBAN ) 2 % Apply topically 2 (two) times daily.     pantoprazole  (PROTONIX ) 40 MG tablet Take 1 tablet (40 mg total) by mouth daily. 90 tablet 2   rosuvastatin  (CRESTOR ) 40 MG tablet Take 1 tablet (40 mg total) by mouth at bedtime. 90 tablet 2   TRESIBA  FLEXTOUCH 200 UNIT/ML FlexTouch Pen Inject 12 Units into the skin in the morning and at bedtime. 9 mL 3   Vitamin D , Ergocalciferol , (DRISDOL ) 1.25 MG (50000 UNIT) CAPS capsule Take 1 capsule (50,000 Units total) by mouth every 7 (seven) days. 12 capsule 6   No current facility-administered medications on file prior to visit.   Past Medical History:  Diagnosis Date   Age-related osteoporosis without current pathological fracture 06/18/2019   Allergic rhinitis    BMI 27.0-27.9,adult 08/17/2019   Diabetes (HCC)    Dysphasia following cerebral infarction 06/18/2019   Hyperlipidemia    Hypertension    Mixed hyperlipidemia 06/18/2019   Mixed incontinence 06/18/2019   Sequelae of poliomyelitis 06/18/2019   Type 2 diabetes mellitus with other specified complication (HCC) 06/18/2019   Past Surgical History:  Procedure Laterality Date   ABDOMINAL HYSTERECTOMY     CATARACT EXTRACTION Bilateral    CHOLECYSTECTOMY     ROTATOR CUFF REPAIR Bilateral      No family history on file. Social History   Socioeconomic History   Marital status: Widowed    Spouse name: Not on file   Number of children: Not on file   Years of education: Not on file   Highest education level: 8th grade  Occupational History   Occupation: Disabled  Tobacco Use   Smoking status: Former    Current packs/day: 0.00    Average packs/day: 1 pack/day for 5.0 years (5.0 ttl pk-yrs)    Types: Cigarettes    Start date: 03/15/1982  Quit date: 03/16/1987    Years since quitting: 36.9   Smokeless tobacco: Never  Vaping Use   Vaping status: Never Used  Substance and Sexual Activity   Alcohol use: No   Drug use: No   Sexual activity: Not Currently  Other Topics Concern   Not on file  Social History Narrative   Not on file   Social Drivers of Health   Financial Resource Strain: Low Risk  (09/07/2022)   Overall Financial Resource Strain (CARDIA)    Difficulty of Paying Living Expenses: Not hard at all  Food Insecurity: No Food Insecurity (09/07/2022)   Hunger Vital Sign    Worried About Running Out of Food in the Last Year: Never true    Ran Out of Food in the Last Year: Never true  Transportation Needs: No Transportation Needs (09/07/2022)   PRAPARE - Administrator, Civil Service (Medical): No    Lack of Transportation (Non-Medical): No  Physical Activity: Sufficiently Active (09/07/2022)   Exercise Vital Sign    Days of Exercise per Week: 5 days    Minutes of Exercise per Session: 30 min  Stress: No Stress Concern Present (09/07/2022)   [employer] of Occupational Health - Occupational Stress Questionnaire    Feeling of Stress : Not at all  Social Connections: Moderately Isolated (09/07/2022)   Social Connection and Isolation Panel    Frequency of Communication with Friends and Family: Three times a week    Frequency of Social Gatherings with Friends and Family: Twice a week    Attends Religious Services: More than 4 times per year     Active Member of [employer] or Organizations: No    Attends Banker Meetings: Never    Marital Status: Widowed    Objective:  BP 94/62   Pulse 63   Temp 97.7 F (36.5 C) (Temporal)   Resp 18   Ht 5' 2 (1.575 m)   Wt 121 lb 12.8 oz (55.2 kg)   LMP  (LMP Unknown)   BMI 22.28 kg/m      01/19/2024    9:07 AM 12/15/2023    9:08 AM 10/24/2023    9:21 AM  BP/Weight  Systolic BP 94 118 130  Diastolic BP 62 72 64  Wt. (Lbs) 121.8 120 123  BMI 22.28 kg/m2 21.95 kg/m2 22.5 kg/m2    Physical Exam  {Perform Simple Foot Exam  Perform Detailed exam:1} {Insert foot Exam (Optional):30965}   Lab Results  Component Value Date   WBC 6.9 01/19/2024   HGB 12.5 01/19/2024   HCT 39.9 01/19/2024   PLT 244 01/19/2024   GLUCOSE 113 (H) 01/19/2024   CHOL 177 01/19/2024   TRIG 117 01/19/2024   HDL 58 01/19/2024   LDLCALC 98 01/19/2024   ALT 15 01/19/2024   AST 24 01/19/2024   NA 140 01/19/2024   K 5.4 (H) 01/19/2024   CL 103 01/19/2024   CREATININE 1.53 (H) 01/19/2024   BUN 36 (H) 01/19/2024   CO2 21 01/19/2024   TSH 1.670 01/19/2024   HGBA1C 6.9 (H) 01/19/2024    Results for orders placed or performed in visit on 01/19/24  POCT URINALYSIS DIP (CLINITEK)   Collection Time: 01/19/24 10:23 AM  Result Value Ref Range   Color, UA yellow yellow   Clarity, UA clear clear   Glucose, UA negative negative mg/dL   Bilirubin, UA negative negative   Ketones, POC UA negative negative mg/dL   Spec Grav, UA 8.984 8.989 -  1.025   Blood, UA trace-intact (A) negative   pH, UA 6.0 5.0 - 8.0   POC PROTEIN,UA trace negative, trace   Urobilinogen, UA 0.2 0.2 or 1.0 E.U./dL   Nitrite, UA Negative Negative   Leukocytes, UA Negative Negative  CBC with Differential/Platelet   Collection Time: 01/19/24 11:11 AM  Result Value Ref Range   WBC 6.9 3.4 - 10.8 x10E3/uL   RBC 4.39 3.77 - 5.28 x10E6/uL   Hemoglobin 12.5 11.1 - 15.9 g/dL   Hematocrit 60.0 65.9 - 46.6 %   MCV 91 79 - 97 fL    MCH 28.5 26.6 - 33.0 pg   MCHC 31.3 (L) 31.5 - 35.7 g/dL   RDW 86.1 88.2 - 84.5 %   Platelets 244 150 - 450 x10E3/uL   Neutrophils 47 Not Estab. %   Lymphs 40 Not Estab. %   Monocytes 9 Not Estab. %   Eos 3 Not Estab. %   Basos 1 Not Estab. %   Neutrophils Absolute 3.3 1.4 - 7.0 x10E3/uL   Lymphocytes Absolute 2.7 0.7 - 3.1 x10E3/uL   Monocytes Absolute 0.6 0.1 - 0.9 x10E3/uL   EOS (ABSOLUTE) 0.2 0.0 - 0.4 x10E3/uL   Basophils Absolute 0.1 0.0 - 0.2 x10E3/uL   Immature Granulocytes 0 Not Estab. %   Immature Grans (Abs) 0.0 0.0 - 0.1 x10E3/uL  Comprehensive metabolic panel with GFR   Collection Time: 01/19/24 11:11 AM  Result Value Ref Range   Glucose 113 (H) 70 - 99 mg/dL   BUN 36 (H) 8 - 27 mg/dL   Creatinine, Ser 8.46 (H) 0.57 - 1.00 mg/dL   eGFR 33 (L) >40 fO/fpw/8.26   BUN/Creatinine Ratio 24 12 - 28   Sodium 140 134 - 144 mmol/L   Potassium 5.4 (H) 3.5 - 5.2 mmol/L   Chloride 103 96 - 106 mmol/L   CO2 21 20 - 29 mmol/L   Calcium  9.6 8.7 - 10.3 mg/dL   Total Protein 7.0 6.0 - 8.5 g/dL   Albumin 4.6 3.7 - 4.7 g/dL   Globulin, Total 2.4 1.5 - 4.5 g/dL   Bilirubin Total 0.3 0.0 - 1.2 mg/dL   Alkaline Phosphatase 60 48 - 129 IU/L   AST 24 0 - 40 IU/L   ALT 15 0 - 32 IU/L  Hemoglobin A1c   Collection Time: 01/19/24 11:11 AM  Result Value Ref Range   Hgb A1c MFr Bld 6.9 (H) 4.8 - 5.6 %   Est. average glucose Bld gHb Est-mCnc 151 mg/dL  Lipid panel   Collection Time: 01/19/24 11:11 AM  Result Value Ref Range   Cholesterol, Total 177 100 - 199 mg/dL   Triglycerides 882 0 - 149 mg/dL   HDL 58 >60 mg/dL   VLDL Cholesterol Cal 21 5 - 40 mg/dL   LDL Chol Calc (NIH) 98 0 - 99 mg/dL   Chol/HDL Ratio 3.1 0.0 - 4.4 ratio  Microalbumin / creatinine urine ratio   Collection Time: 01/19/24 11:11 AM  Result Value Ref Range   Creatinine, Urine 57.7 Not Estab. mg/dL   Microalbumin, Urine 51.6 Not Estab. ug/mL   Microalb/Creat Ratio 84 (H) 0 - 29 mg/g creat  Thyroid  Panel With  TSH   Collection Time: 01/19/24 11:11 AM  Result Value Ref Range   TSH 1.670 0.450 - 4.500 uIU/mL   T4, Total 9.9 4.5 - 12.0 ug/dL   T3 Uptake Ratio 27 24 - 39 %   Free Thyroxine Index 2.7 1.2 - 4.9  .  Assessment & Plan:   Assessment & Plan Encounter for Medicare annual wellness exam Annual wellness visit conducted.    Solitary pulmonary nodule Previous CT scan showed a nodule on the lung. Follow-up imaging needed to assess for changes or progression. - Ordered CT scan of the chest with contrast to evaluate the pulmonary nodule. - Will assess kidney function prior to CT scan due to contrast use. Orders:   CT CHEST ABDOMEN PELVIS W CONTRAST; Future  Hypertension, unspecified type Blood pressure lower than usual today. Possible dehydration contributing to lower blood pressure. - Encouraged adequate fluid intake to maintain blood pressure stability. Orders:   CBC with Differential/Platelet   Comprehensive metabolic panel with GFR   Thyroid  Panel With TSH  Type 2 diabetes mellitus with hyperglycemia, with long-term current use of insulin  (HCC) Currently managed with Tresiba . Discussed importance of maintaining adequate nutrition and hydration to prevent complications. - Continue current diabetes management with Tresiba . - Encouraged regular meals and adequate hydration. Orders:   Hemoglobin A1c   Microalbumin / creatinine urine ratio  Urinary incontinence, unspecified type Urine sample taken today to rule out UTI Will adjust treatment as needed Orders:   POCT URINALYSIS DIP (CLINITEK)   Mixed hyperlipidemia Well controlled.  Continue to work on eating a healthy diet and exercise.  Labs ordered today.   No major side effects reported, and no issues with compliance. The current medical regimen is effective;  continue present plan with Zetia  10mg  Will adjust medication as needed depending on labs  Orders:   Lipid panel   Unintentional weight loss Reports  unintentional weight loss despite adequate food intake. Possible contributing factors include dehydration and inadequate nutrition. Dehydration suspected due to low fluid intake. - Ordered blood tests to evaluate for underlying causes of weight loss. - Encouraged increased fluid intake. - Advised on maintaining adequate nutrition, including protein intake. Orders:   CT CHEST ABDOMEN PELVIS W CONTRAST; Future   Body mass index is 22.28 kg/m.    No orders of the defined types were placed in this encounter.   Orders Placed This Encounter  Procedures   CT CHEST ABDOMEN PELVIS W CONTRAST   CBC with Differential/Platelet   Comprehensive metabolic panel with GFR   Hemoglobin A1c   Lipid panel   Microalbumin / creatinine urine ratio   Thyroid  Panel With TSH   POCT URINALYSIS DIP (CLINITEK)     I,Marla I Leal-Borjas,acting as a scribe for [employer], PA.,have documented all relevant documentation on the behalf of Nola Angles, PA,as directed by  Nola Angles, PA while in the presence of Nola Angles, [place].   Follow-up: Return in 1 year (on 01/18/2025).  An After Visit Summary was printed and given to the patient.  Nola Angles, [place] [facility] [phone]

## 2024-01-19 NOTE — Assessment & Plan Note (Addendum)
 Reports unintentional weight loss despite adequate food intake. Possible contributing factors include dehydration and inadequate nutrition. Dehydration suspected due to low fluid intake. - Ordered blood tests to evaluate for underlying causes of weight loss. - Encouraged increased fluid intake. - Advised on maintaining adequate nutrition, including protein intake. Orders:   CT CHEST ABDOMEN PELVIS W CONTRAST; Future

## 2024-01-19 NOTE — Assessment & Plan Note (Addendum)
 Well controlled.  Continue to work on eating a healthy diet and exercise.  Labs ordered today.   No major side effects reported, and no issues with compliance. The current medical regimen is effective;  continue present plan with Zetia  10mg  Will adjust medication as needed depending on labs  Orders:   Lipid panel

## 2024-01-19 NOTE — Assessment & Plan Note (Addendum)
 Annual wellness visit conducted.

## 2024-01-19 NOTE — Assessment & Plan Note (Addendum)
 Urine sample taken today to rule out UTI Will adjust treatment as needed Orders:   POCT URINALYSIS DIP (CLINITEK)

## 2024-01-20 LAB — LIPID PANEL
Chol/HDL Ratio: 3.1 ratio (ref 0.0–4.4)
Cholesterol, Total: 177 mg/dL (ref 100–199)
HDL: 58 mg/dL (ref >39)
LDL Chol Calc (NIH): 98 mg/dL (ref 0–99)
Triglycerides: 117 mg/dL (ref 0–149)
VLDL Cholesterol Cal: 21 mg/dL (ref 5–40)

## 2024-01-20 LAB — COMPREHENSIVE METABOLIC PANEL WITH GFR
ALT: 15 IU/L (ref 0–32)
AST: 24 IU/L (ref 0–40)
Albumin: 4.6 g/dL (ref 3.7–4.7)
Alkaline Phosphatase: 60 IU/L (ref 48–129)
BUN/Creatinine Ratio: 24 (ref 12–28)
BUN: 36 mg/dL — ABNORMAL HIGH (ref 8–27)
Bilirubin Total: 0.3 mg/dL (ref 0.0–1.2)
CO2: 21 mmol/L (ref 20–29)
Calcium: 9.6 mg/dL (ref 8.7–10.3)
Chloride: 103 mmol/L (ref 96–106)
Creatinine, Ser: 1.53 mg/dL — ABNORMAL HIGH (ref 0.57–1.00)
Globulin, Total: 2.4 g/dL (ref 1.5–4.5)
Glucose: 113 mg/dL — ABNORMAL HIGH (ref 70–99)
Potassium: 5.4 mmol/L — ABNORMAL HIGH (ref 3.5–5.2)
Sodium: 140 mmol/L (ref 134–144)
Total Protein: 7 g/dL (ref 6.0–8.5)
eGFR: 33 mL/min/1.73 — ABNORMAL LOW (ref >59)

## 2024-01-20 LAB — CBC WITH DIFFERENTIAL/PLATELET
Basophils Absolute: 0.1 x10E3/uL (ref 0.0–0.2)
Basos: 1 %
EOS (ABSOLUTE): 0.2 x10E3/uL (ref 0.0–0.4)
Eos: 3 %
Hematocrit: 39.9 % (ref 34.0–46.6)
Hemoglobin: 12.5 g/dL (ref 11.1–15.9)
Immature Grans (Abs): 0 x10E3/uL (ref 0.0–0.1)
Immature Granulocytes: 0 %
Lymphocytes Absolute: 2.7 x10E3/uL (ref 0.7–3.1)
Lymphs: 40 %
MCH: 28.5 pg (ref 26.6–33.0)
MCHC: 31.3 g/dL — ABNORMAL LOW (ref 31.5–35.7)
MCV: 91 fL (ref 79–97)
Monocytes Absolute: 0.6 x10E3/uL (ref 0.1–0.9)
Monocytes: 9 %
Neutrophils Absolute: 3.3 x10E3/uL (ref 1.4–7.0)
Neutrophils: 47 %
Platelets: 244 x10E3/uL (ref 150–450)
RBC: 4.39 x10E6/uL (ref 3.77–5.28)
RDW: 13.8 % (ref 11.7–15.4)
WBC: 6.9 x10E3/uL (ref 3.4–10.8)

## 2024-01-20 LAB — MICROALBUMIN / CREATININE URINE RATIO
Creatinine, Urine: 57.7 mg/dL
Microalb/Creat Ratio: 84 mg/g{creat} — ABNORMAL HIGH (ref 0–29)
Microalbumin, Urine: 48.3 ug/mL

## 2024-01-20 LAB — THYROID PANEL WITH TSH
Free Thyroxine Index: 2.7 (ref 1.2–4.9)
T3 Uptake Ratio: 27 % (ref 24–39)
T4, Total: 9.9 ug/dL (ref 4.5–12.0)
TSH: 1.67 u[IU]/mL (ref 0.450–4.500)

## 2024-01-20 LAB — HEMOGLOBIN A1C
Est. average glucose Bld gHb Est-mCnc: 151 mg/dL
Hgb A1c MFr Bld: 6.9 % — ABNORMAL HIGH (ref 4.8–5.6)

## 2024-01-22 ENCOUNTER — Ambulatory Visit: Payer: Self-pay | Admitting: Physician Assistant

## 2024-01-24 ENCOUNTER — Ambulatory Visit: Payer: Self-pay

## 2024-01-24 NOTE — Telephone Encounter (Signed)
 FYI Only or Action Required?: Action required by provider: clinical question for provider.  Patient was last seen in primary care on 01/19/2024 by Milon Cleaves, PA.  Called Nurse Triage reporting URI.  Symptoms began about a month ago.  Interventions attempted: Rest, hydration, or home remedies.  Symptoms are: gradually worsening.  Triage Disposition: See HCP Within 4 Hours (Or PCP Triage)  Patient/caregiver understands and will follow disposition?:  Reason for Disposition  [1] Sinus congestion (pressure, fullness) AND [2] present > 10 days  Answer Assessment - Initial Assessment Questions States no improvement in symptoms since she took Zpack in October. Was seen again on 01/19/24 and does not wish to come back in if possible. Advised that it would be best to be seen for re-evaluation. Requests call back.  1. LOCATION: Where does it hurt?      Sinus pain and pressure with productive cough  2. ONSET: When did the sinus pain start?  (e.g., hours, days)      12/15/23  3. SEVERITY: How bad is the pain?   (Scale 0-10; or none, mild, moderate or severe)     Severe  6. NASAL DISCHARGE: Do you have discharge from your nose? If so ask, What color?     White and snotty  7. FEVER: Do you have a fever? If Yes, ask: What is it, how was it measured, and when did it start?      Denies  8. OTHER SYMPTOMS: Do you have any other symptoms? (e.g., sore throat, cough, earache, difficulty breathing)     Sore throat  Protocols used: Sinus Pain or Congestion-A-AH Copied from CRM #[id number]. Topic: Clinical - Red Word Triage >> Jan 24, 2024  8:06 AM Treva T wrote: [facility] that prompted transfer to Nurse Triage: Patient states she has sinus infection, with thick colored mucous, cold chills, sweating, productive coughing, causing back pain.  Patient also reports she has throat drainage.

## 2024-01-25 ENCOUNTER — Ambulatory Visit: Payer: Self-pay

## 2024-01-25 ENCOUNTER — Other Ambulatory Visit (HOSPITAL_BASED_OUTPATIENT_CLINIC_OR_DEPARTMENT_OTHER): Admitting: Radiology

## 2024-01-25 NOTE — Telephone Encounter (Signed)
 Patient is coming in 11.14 for an appointment

## 2024-01-25 NOTE — Telephone Encounter (Signed)
 FYI Only or Action Required?: Action required by provider: Requesting antibiotics.  Patient was last seen in primary care on 01/19/2024 by Milon Cleaves, PA.  Called Nurse Triage reporting Sinus Problem.  Symptoms began about a month ago.  Interventions attempted: Rest, hydration, or home remedies.  Symptoms are: gradually worsening.  Triage Disposition: See PCP When Office is Open (Within 3 Days)  Patient/caregiver understands and will follow disposition?: No, wishes to speak with PCP           Copied from CRM #[id number]. Topic: Clinical - Red Word Triage >> Jan 25, 2024  8:25 AM Macario HERO wrote: Red Word that prompted transfer to Nurse Triage: Patient called said she still has not received a call from Dr. Encarnacion nurse regarding possible sinus infection, congested and coughing up mucus. Reason for Disposition  [1] Sinus congestion (pressure, fullness) AND [2] present > 10 days  Answer Assessment - Initial Assessment Questions Patient refused appt in office. Requesting a z pak for symptoms to be called in. Preferred pharmacy below. Patient requesting a call back regarding request.   [id number], [place] - [address], [place] [phone]    1. LOCATION: Where does it hurt?     Sinus pain  2. ONSET: When did the sinus pain start?  (e.g., hours, days)      12/15/2023  3. FEVER: Do you have a fever? If Yes, ask: What is it, how was it measured, and when did it start?      Denies  4. OTHER SYMPTOMS: Do you have any other symptoms? (e.g., sore throat, cough, earache, difficulty breathing)     Cough with white snot  Protocols used: Sinus Pain or Congestion-A-AH

## 2024-01-25 NOTE — Telephone Encounter (Signed)
 Patient scheduled for appointment.

## 2024-01-26 DIAGNOSIS — R0981 Nasal congestion: Secondary | ICD-10-CM | POA: Diagnosis not present

## 2024-01-26 DIAGNOSIS — R051 Acute cough: Secondary | ICD-10-CM | POA: Diagnosis not present

## 2024-01-26 DIAGNOSIS — R059 Cough, unspecified: Secondary | ICD-10-CM | POA: Diagnosis not present

## 2024-01-27 ENCOUNTER — Ambulatory Visit

## 2024-01-31 ENCOUNTER — Ambulatory Visit: Admitting: Physician Assistant

## 2024-02-01 ENCOUNTER — Ambulatory Visit (INDEPENDENT_AMBULATORY_CARE_PROVIDER_SITE_OTHER): Admitting: Family Medicine

## 2024-02-01 ENCOUNTER — Encounter: Payer: Self-pay | Admitting: Family Medicine

## 2024-02-01 VITALS — BP 88/60 | HR 61 | Temp 98.0°F | Resp 20 | Ht 62.0 in | Wt 119.0 lb

## 2024-02-01 DIAGNOSIS — J069 Acute upper respiratory infection, unspecified: Secondary | ICD-10-CM

## 2024-02-01 DIAGNOSIS — U071 COVID-19: Secondary | ICD-10-CM

## 2024-02-01 DIAGNOSIS — R6889 Other general symptoms and signs: Secondary | ICD-10-CM | POA: Diagnosis not present

## 2024-02-01 LAB — POC COVID19 BINAXNOW: SARS Coronavirus 2 Ag: POSITIVE — AB

## 2024-02-01 LAB — POC INFLUENZA A&B (BINAX/QUICKVUE)
Influenza A, POC: NEGATIVE
Influenza B, POC: NEGATIVE

## 2024-02-01 MED ORDER — ALBUTEROL SULFATE HFA 108 (90 BASE) MCG/ACT IN AERS: 2.0000 | INHALATION_SPRAY | Freq: Four times a day (QID) | RESPIRATORY_TRACT | 2 refills | Status: DC | PRN

## 2024-02-01 MED ORDER — ALBUTEROL SULFATE (2.5 MG/3ML) 0.083% IN NEBU: 2.5000 mg | INHALATION_SOLUTION | Freq: Four times a day (QID) | RESPIRATORY_TRACT | 12 refills | Status: AC | PRN

## 2024-02-01 NOTE — Progress Notes (Signed)
 Subjective:  Patient ID: Rebecca Hodge, female    DOB: 12-27-1940  Age: 83 y.o. MRN: [id number]  Chief Complaint  Patient presents with   Hospitalization Follow-up    Urgent care follow up   Discussed the use of AI scribe software for clinical note transcription with the patient, who gave verbal consent to proceed.  History of Present Illness   Rebecca Hodge is an 83 year old female who presents with COVID-19 symptoms. She is accompanied by her daughter, who is her primary caregiver.  Upper and lower respiratory symptoms - Congestion for two weeks, described as 'white and snotty, just as thick as it can be' - Cough and chest discomfort present for two weeks - Wheezing present - Sore throat reported - No recent influenza infection - Recent chest x-ray did not show pneumonia  Systemic symptoms - No energy - Body aches - Fever-like symptoms  Functional status - Mostly bedridden over the past two weeks  Respiratory support and medications - Recent urgent care visit  - Completed course of prednisone and antibiotics without benefit - Uses albuterol  inhaler and nebulizer every six hours; requires refill - Using humidifier with Vicks for congestion - Taking Tylenol for body aches and fever-like symptoms  Immunization status - Received all vaccinations         10/24/2023    9:34 AM 07/21/2023    8:33 AM 04/22/2023    9:20 AM 10/20/2022    8:22 AM 09/07/2022    9:23 AM  Depression screen PHQ 2/9  Decreased Interest 0 0 0 0 0  Down, Depressed, Hopeless 0 0 0 0 0  PHQ - 2 Score 0 0 0 0 0  Altered sleeping 0 0 0 0   Tired, decreased energy 0 0 0 0   Change in appetite 0 0 0 0   Feeling bad or failure about yourself  0 0 0 0   Trouble concentrating 0 0 0 0   Moving slowly or fidgety/restless  0 0 0   Suicidal thoughts 0 0 0 0   PHQ-9 Score 0  0  0  0    Difficult doing work/chores Not difficult at all Not difficult at all Not difficult at all Not difficult at all       Data saved with a previous flowsheet row definition        10/24/2023    9:34 AM  Fall Risk   Falls in the past year? 0  Number falls in past yr: 0  Injury with Fall? 0  Risk for fall due to : Impaired balance/gait  Follow up Education provided    Patient Care Team: Milon Cleaves, [place] as PCP - General (Physician Assistant) Nyle Rankin POUR, [facility] (Inactive) as Pharmacist (Pharmacist)   Review of Systems  Constitutional:  Negative for chills, diaphoresis, fatigue and fever.  HENT:  Positive for congestion, postnasal drip, rhinorrhea and sinus pressure. Negative for ear pain and sinus pain.   Eyes: Negative.   Respiratory:  Positive for cough and chest tightness. Negative for shortness of breath.   Cardiovascular:  Negative for chest pain.  Gastrointestinal:  Negative for abdominal pain, constipation, nausea and vomiting.  Endocrine: Negative.   Genitourinary:  Negative for dysuria, frequency and urgency.  Musculoskeletal:  Negative for arthralgias.  Allergic/Immunologic: Negative.   Neurological:  Positive for dizziness and headaches. Negative for weakness.  Hematological: Negative.   Psychiatric/Behavioral:  Negative for dysphoric mood. The patient is not nervous/anxious.  Current Outpatient Medications on File Prior to Visit  Medication Sig Dispense Refill   alendronate  (FOSAMAX ) 70 MG tablet Take 1 tablet (70 mg total) by mouth once a week. Take with a full glass of water on an empty stomach. 12 tablet 3   BD PEN NEEDLE NANO U/F 32G X 4 MM MISC Use new needle with each injection 50 each 1   Blood Glucose Monitoring Suppl (ACCU-CHEK GUIDE) w/Device KIT 1 each by Other route daily. 1 kit 0   clotrimazole-betamethasone (LOTRISONE) cream Apply topically.     estradiol  (ESTRACE ) 0.1 MG/GM vaginal cream INSERT 1 APPLICATORFUL VAGINALLY 3 TIMES A WEEK 42.5 g 3   fluticasone  (FLONASE ) 50 MCG/ACT nasal spray Place 2 sprays into both nostrils daily. 16 g 6   gabapentin  (NEURONTIN ) 300  MG capsule Take 1 capsule (300 mg total) by mouth 3 (three) times daily. 270 capsule 2   glucose blood (ACCU-CHEK GUIDE TEST) test strip Check FBS daily 100 each 12   Lancets (ONETOUCH ULTRASOFT) lancets 1 each by Other route daily. Use as instructed 100 each 2   liraglutide  (VICTOZA ) 18 MG/3ML SOPN ADMINISTER 1.2 MG UNDER THE SKIN DAILY 9 mL 1   lisinopril  (ZESTRIL ) 20 MG tablet TAKE 1 TABLET(20 MG) BY MOUTH DAILY 90 tablet 1   loratadine  (CLARITIN ) 10 MG tablet Take 10 mg by mouth daily.     metoprolol  tartrate (LOPRESSOR ) 25 MG tablet Take 1 tablet (25 mg total) by mouth 2 (two) times daily. (Patient taking differently: Take 25 mg by mouth daily.) 180 tablet 2   mupirocin  ointment (BACTROBAN ) 2 % Apply topically 2 (two) times daily.     pantoprazole  (PROTONIX ) 40 MG tablet Take 1 tablet (40 mg total) by mouth daily. 90 tablet 2   rosuvastatin  (CRESTOR ) 40 MG tablet Take 1 tablet (40 mg total) by mouth at bedtime. 90 tablet 2   TRESIBA  FLEXTOUCH 200 UNIT/ML FlexTouch Pen Inject 12 Units into the skin in the morning and at bedtime. 9 mL 3   Vitamin D , Ergocalciferol , (DRISDOL ) 1.25 MG (50000 UNIT) CAPS capsule Take 1 capsule (50,000 Units total) by mouth every 7 (seven) days. 12 capsule 6   No current facility-administered medications on file prior to visit.   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 No  pertinent family history. Social History   Socioeconomic History   Marital status: Widowed    Spouse name: Not on file   Number of children: Not on file   Years of education: Not on file   Highest education level: 8th grade  Occupational History   Occupation: Disabled  Tobacco Use   Smoking status: Former    Current packs/day: 0.00    Average packs/day: 1 pack/day for 5.0 years (5.0 ttl pk-yrs)    Types: Cigarettes    Start date: 03/15/1982    Quit date: 03/16/1987    Years since quitting:  36.9   Smokeless tobacco: Never  Vaping Use   Vaping status: Never Used  Substance and Sexual Activity   Alcohol use: No   Drug use: No   Sexual activity: Not Currently  Other Topics Concern   Not on file  Social History Narrative   Not on file   Social Drivers of Health   Financial Resource Strain: Low Risk  (09/07/2022)   Overall Financial Resource Strain (CARDIA)    Difficulty of Paying Living Expenses: Not hard at all  Food Insecurity: No Food Insecurity (09/07/2022)   Hunger Vital Sign    Worried About Running Out of Food in the Last Year: Never true    Ran Out of Food in the Last Year: Never true  Transportation Needs: No Transportation Needs (09/07/2022)   PRAPARE - Administrator, Civil Service (Medical): No    Lack of Transportation (Non-Medical): No  Physical Activity: Sufficiently Active (09/07/2022)   Exercise Vital Sign    Days of Exercise per Week: 5 days    Minutes of Exercise per Session: 30 min  Stress: No Stress Concern Present (09/07/2022)   [employer] of Occupational Health - Occupational Stress Questionnaire    Feeling of Stress : Not at all  Social Connections: Moderately Isolated (09/07/2022)   Social Connection and Isolation Panel    Frequency of Communication with Friends and Family: Three times a week    Frequency of Social Gatherings with Friends and Family: Twice a week    Attends Religious Services: More than 4 times per year    Active  Member of [employer] or Organizations: No    Attends Banker Meetings: Never    Marital Status: Widowed    Objective:  BP (!) 88/60   Pulse 61   Temp 98 F (36.7 C) (Temporal)   Resp 20   Ht 5' 2 (1.575 m)   Wt 119 lb (54 kg)   LMP  (LMP Unknown)   SpO2 97%   BMI 21.77 kg/m      02/01/2024    1:47 PM 01/19/2024    9:07 AM 12/15/2023    9:08 AM  BP/Weight  Systolic BP 88 94 118  Diastolic BP 60 62 72  Wt. (Lbs) 119 121.8 120  BMI 21.77 kg/m2 22.28 kg/m2 21.95 kg/m2    Physical Exam Constitutional:      General: She is not in acute distress.    Appearance: Normal appearance. She is normal weight. She is ill-appearing.  HENT:     Nose: Congestion present.  Cardiovascular:     Rate and Rhythm: Normal rate and regular rhythm.     Heart sounds: Normal heart sounds. No murmur heard. Pulmonary:     Effort: Pulmonary effort is normal. No respiratory distress.     Breath sounds: Examination of the right-lower field reveals wheezing. Examination of the left-lower field reveals wheezing. Wheezing present.  Abdominal:     General: Bowel sounds are normal.     Palpations: Abdomen is soft.     Tenderness: There is no abdominal tenderness.  Musculoskeletal:        General: Normal range of motion.  Neurological:     Mental Status: She is alert and oriented to person, place, and time. Mental status is at baseline.  Psychiatric:        Mood and Affect: Mood normal.        Behavior: Behavior normal.     Lab Results  Component Value Date  WBC 6.9 01/19/2024   HGB 12.5 01/19/2024   HCT 39.9 01/19/2024   PLT 244 01/19/2024   GLUCOSE 113 (H) 01/19/2024   CHOL 177 01/19/2024   TRIG 117 01/19/2024   HDL 58 01/19/2024   LDLCALC 98 01/19/2024   ALT 15 01/19/2024   AST 24 01/19/2024   NA 140 01/19/2024   K 5.4 (H) 01/19/2024   CL 103 01/19/2024   CREATININE 1.53 (H) 01/19/2024   BUN 36 (H) 01/19/2024   CO2 21 01/19/2024   TSH 1.670 01/19/2024   HGBA1C 6.9 (H)  01/19/2024    Results for orders placed or performed in visit on 02/01/24  POC COVID-19   Collection Time: 02/01/24  2:04 PM  Result Value Ref Range   SARS Coronavirus 2 Ag Positive (A) Negative  POC Influenza A&B (Binax test)   Collection Time: 02/01/24  2:10 PM  Result Value Ref Range   Influenza A, POC Negative Negative   Influenza B, POC Negative Negative  .  Assessment & Plan:   Assessment & Plan Flu-like symptoms General symptoms associated with COVID-19 (cough, congestion, sore throat, fatigue, body aches) Flu - negative Covid-19 - positive Symptoms include cough, congestion, sore throat, fatigue, and body aches. Tylenol used for relief. Sore throat management with hot tea, honey, and lemon recommended. - Continue Tylenol for symptom relief. - Use hot tea with honey and lemon for sore throat. - Monitor symptoms and seek medical attention if she worsens.  Orders:   POC Influenza A&B (Binax test)   POC COVID-19  Upper respiratory tract infection due to COVID-19 virus COVID-19 infection Confirmed COVID-19 infection with persistent symptoms. Outside antiviral treatment window. No pneumonia on chest x-ray. Requires symptomatic management and monitoring for complications. - Prescribed albuterol  nebulizer and inhaler every six hours for wheezing. - Advised mask-wearing for ten days from symptom onset. - Encouraged rest and hydration. - Recommended humidifier and warm showers. - Instructed on good hand hygiene. - Advised seeking medical attention if symptoms worsen. Orders:   albuterol  (PROVENTIL ) (2.5 MG/3ML) 0.083% nebulizer solution; Take 3 mLs (2.5 mg total) by nebulization every 6 (six) hours as needed for wheezing or shortness of breath.   albuterol  (VENTOLIN  HFA) 108 (90 Base) MCG/ACT inhaler; Inhale 2 puffs into the lungs every 6 (six) hours as needed for wheezing or shortness of breath.   Follow-up: Return if symptoms worsen or fail to improve.  An After Visit  Summary was printed and given to the patient.  Harrie Cedar, FNP [facility] [phone]

## 2024-02-05 ENCOUNTER — Other Ambulatory Visit: Payer: Self-pay | Admitting: Physician Assistant

## 2024-02-20 ENCOUNTER — Other Ambulatory Visit: Payer: Self-pay | Admitting: Physician Assistant

## 2024-02-23 ENCOUNTER — Ambulatory Visit: Admitting: Physician Assistant

## 2024-03-01 ENCOUNTER — Ambulatory Visit: Admitting: Physician Assistant

## 2024-03-01 ENCOUNTER — Encounter: Payer: Self-pay | Admitting: Physician Assistant

## 2024-03-01 DIAGNOSIS — N1831 Chronic kidney disease, stage 3a: Secondary | ICD-10-CM | POA: Diagnosis not present

## 2024-03-01 DIAGNOSIS — J3489 Other specified disorders of nose and nasal sinuses: Secondary | ICD-10-CM

## 2024-03-01 DIAGNOSIS — R634 Abnormal weight loss: Secondary | ICD-10-CM

## 2024-03-01 DIAGNOSIS — J0100 Acute maxillary sinusitis, unspecified: Secondary | ICD-10-CM | POA: Diagnosis not present

## 2024-03-01 DIAGNOSIS — R32 Unspecified urinary incontinence: Secondary | ICD-10-CM

## 2024-03-01 DIAGNOSIS — R911 Solitary pulmonary nodule: Secondary | ICD-10-CM

## 2024-03-01 LAB — POCT URINALYSIS DIP (CLINITEK)
Bilirubin, UA: NEGATIVE
Blood, UA: NEGATIVE
Glucose, UA: NEGATIVE mg/dL
Ketones, POC UA: NEGATIVE mg/dL
Leukocytes, UA: NEGATIVE
Nitrite, UA: NEGATIVE
POC PROTEIN,UA: NEGATIVE
Spec Grav, UA: 1.015 (ref 1.010–1.025)
Urobilinogen, UA: 0.2 U/dL
pH, UA: 6 (ref 5.0–8.0)

## 2024-03-01 MED ADMIN — Triamcinolone Acetonide Inj Susp 40 MG/ML: 40 mg | INTRAMUSCULAR | @ 16:00:00 | NDC 70121116901

## 2024-03-01 NOTE — Assessment & Plan Note (Addendum)
 Labs drawn today Continue to monitor diet Continue to supplement with protein shakes Orders:   CBC with Differential/Platelet   Comprehensive metabolic panel with GFR

## 2024-03-01 NOTE — Assessment & Plan Note (Addendum)
 Urinary symptoms Frequent urination and occasional dark urine, risk for recurrent UTIs post-viral illness. - Obtain urine sample for UTI check. - Advise increased water intake. Orders:   POCT URINALYSIS DIP (CLINITEK)

## 2024-03-01 NOTE — Progress Notes (Signed)
 "  Subjective:  Patient ID: Rebecca Hodge, female    DOB: Dec 27, 1940  Age: 83 y.o. MRN: [id number]  Chief Complaint  Patient presents with   Medical Management of Chronic Issues    HPI:  Discussed the use of AI scribe software for clinical note transcription with the patient, who gave verbal consent to proceed.  History of Present Illness Rebecca Hodge is an 83 year old female with recurrent urinary tract infections who presents with sinus congestion and urinary symptoms.  She experiences significant sinus congestion and nasal soreness, describing it as 'real bad this year.' Her nose is sore and running, and she has not received a steroid shot this year, which she typically gets in the spring for her allergies.  She has a history of lung disease and mentions a nodule in her lung, which she describes as 'as big as a six inch piece.' She describes the nodule as 'as big as a six inch piece.'  She reports urinary symptoms, including frequent urination, especially at night, and occasional dark-colored urine. She has a history of recurrent urinary tract infections, particularly after viral infections. Her bladder had previously 'fallen down' and required surgical intervention.  She had COVID-19 recently, which was initially misdiagnosed as a sinus infection. She was tested and confirmed to have COVID-19 after visiting urgent care. She did not receive any steroid shots during her COVID-19 illness.  She mentions a history of kidney issues and is concerned about her kidney function, noting that she has experienced dark-colored urine occasionally.        02/02/2024    8:50 AM 10/24/2023    9:34 AM 07/21/2023    8:33 AM 04/22/2023    9:20 AM 10/20/2022    8:22 AM  Depression screen PHQ 2/9  Decreased Interest 0 0 0 0 0  Down, Depressed, Hopeless 0 0 0 0 0  PHQ - 2 Score 0 0 0 0 0  Altered sleeping 0 0 0 0 0  Tired, decreased energy 0 0 0 0 0  Change in appetite 0 0 0 0 0  Feeling bad or  failure about yourself  0 0 0 0 0  Trouble concentrating 0 0 0 0 0  Moving slowly or fidgety/restless 0  0 0 0  Suicidal thoughts 0 0 0 0 0  PHQ-9 Score 0 0  0  0  0   Difficult doing work/chores Not difficult at all Not difficult at all Not difficult at all Not difficult at all Not difficult at all     Data saved with a previous flowsheet row definition        02/02/2024    8:43 AM  Fall Risk   Falls in the past year? 0  Number falls in past yr: 0  Injury with Fall? 0   Risk for fall due to : No Fall Risks  Follow up Falls evaluation completed     Data saved with a previous flowsheet row definition    Patient Care Team: Milon Cleaves, [place] as PCP - General (Physician Assistant) Nyle Rankin POUR, [facility] (Inactive) as Pharmacist (Pharmacist)   Review of Systems  Constitutional:  Negative for appetite change, fatigue and fever.  HENT:  Negative for congestion, ear pain, sinus pressure and sore throat.   Respiratory:  Negative for cough, chest tightness, shortness of breath and wheezing.   Cardiovascular:  Negative for chest pain and palpitations.  Gastrointestinal:  Negative for abdominal pain, constipation, diarrhea, nausea and vomiting.  Genitourinary:  Negative for dysuria and hematuria.  Musculoskeletal:  Negative for arthralgias, back pain, joint swelling and myalgias.  Skin:  Negative for rash.  Neurological:  Negative for dizziness, weakness and headaches.  Psychiatric/Behavioral:  Negative for dysphoric mood. The patient is not nervous/anxious.     Medications Ordered Prior to Encounter[1] Past Medical History:  Diagnosis Date   Age-related osteoporosis without current pathological fracture 06/18/2019   Allergic rhinitis    BMI 27.0-27.9,adult 08/17/2019   Diabetes (HCC)    Dysphasia following cerebral infarction 06/18/2019   Hyperlipidemia    Hypertension    Mixed hyperlipidemia 06/18/2019   Mixed incontinence 06/18/2019   Sequelae of poliomyelitis 06/18/2019   Type 2  diabetes mellitus with other specified complication (HCC) 06/18/2019   Past Surgical History:  Procedure Laterality Date   ABDOMINAL HYSTERECTOMY     CATARACT EXTRACTION Bilateral    CHOLECYSTECTOMY     ROTATOR CUFF REPAIR Bilateral     History reviewed. No pertinent family history. Social History   Socioeconomic History   Marital status: Widowed    Spouse name: Not on file   Number of children: Not on file   Years of education: Not on file   Highest education level: 8th grade  Occupational History   Occupation: Disabled  Tobacco Use   Smoking status: Former    Current packs/day: 0.00    Average packs/day: 1 pack/day for 5.0 years (5.0 ttl pk-yrs)    Types: Cigarettes    Start date: 03/15/1982    Quit date: 03/16/1987    Years since quitting: 36.9   Smokeless tobacco: Never  Vaping Use   Vaping status: Never Used  Substance and Sexual Activity   Alcohol use: No   Drug use: No   Sexual activity: Not Currently  Other Topics Concern   Not on file  Social History Narrative   Not on file   Social Drivers of Health   Tobacco Use: Medium Risk (03/01/2024)   Patient History    Smoking Tobacco Use: Former    Smokeless Tobacco Use: Never    Passive Exposure: Not on Actuary Strain: Low Risk (09/07/2022)   Overall Financial Resource Strain (CARDIA)    Difficulty of Paying Living Expenses: Not hard at all  Food Insecurity: No Food Insecurity (01/19/2024)   Epic    Worried About Programme Researcher, Broadcasting/film/video in the Last Year: Never true    Ran Out of Food in the Last Year: Never true  Transportation Needs: No Transportation Needs (01/19/2024)   Epic    Lack of Transportation (Medical): No    Lack of Transportation (Non-Medical): No  Physical Activity: Sufficiently Active (01/19/2024)   Exercise Vital Sign    Days of Exercise per Week: 5 days    Minutes of Exercise per Session: 30 min  Stress: No Stress Concern Present (01/19/2024)   [employer] of Occupational  Health - Occupational Stress Questionnaire    Feeling of Stress: Not at all  Social Connections: Moderately Isolated (01/19/2024)   Social Connection and Isolation Panel    Frequency of Communication with Friends and Family: Three times a week    Frequency of Social Gatherings with Friends and Family: Twice a week    Attends Religious Services: More than 4 times per year    Active Member of [employer] or Organizations: No    Attends Banker Meetings: Never    Marital Status: Widowed  Depression (PHQ2-9): Low Risk (02/02/2024)   Depression (PHQ2-9)  PHQ-2 Score: 0  Alcohol Screen: Low Risk (09/07/2022)   Alcohol Screen    Last Alcohol Screening Score (AUDIT): 0  Housing: Unknown (01/19/2024)   Epic    Unable to Pay for Housing in the Last Year: No    Number of Times Moved in the Last Year: Not on file    Homeless in the Last Year: No  Utilities: Not At Risk (01/19/2024)   Epic    Threatened with loss of utilities: No  Health Literacy: Inadequate Health Literacy (10/20/2022)   B1300 Health Literacy    Frequency of need for help with medical instructions: Sometimes    Objective:  LMP  (LMP Unknown)      02/01/2024    1:47 PM 01/19/2024    9:07 AM 12/15/2023    9:08 AM  BP/Weight  Systolic BP 88 94 118  Diastolic BP 60 62 72  Wt. (Lbs) 119 121.8 120  BMI 21.77 kg/m2 22.28 kg/m2 21.95 kg/m2    Physical Exam Vitals reviewed.  Constitutional:      Appearance: Normal appearance.  Cardiovascular:     Rate and Rhythm: Normal rate and regular rhythm.     Heart sounds: Normal heart sounds.  Pulmonary:     Effort: Pulmonary effort is normal.     Breath sounds: Normal breath sounds.  Abdominal:     General: Bowel sounds are normal.     Palpations: Abdomen is soft.     Tenderness: There is no abdominal tenderness.  Neurological:     Mental Status: She is alert and oriented to person, place, and time.  Psychiatric:        Mood and Affect: Mood normal.        Behavior:  Behavior normal.       Lab Results  Component Value Date   WBC 6.9 01/19/2024   HGB 12.5 01/19/2024   HCT 39.9 01/19/2024   PLT 244 01/19/2024   GLUCOSE 113 (H) 01/19/2024   CHOL 177 01/19/2024   TRIG 117 01/19/2024   HDL 58 01/19/2024   LDLCALC 98 01/19/2024   ALT 15 01/19/2024   AST 24 01/19/2024   NA 140 01/19/2024   K 5.4 (H) 01/19/2024   CL 103 01/19/2024   CREATININE 1.53 (H) 01/19/2024   BUN 36 (H) 01/19/2024   CO2 21 01/19/2024   TSH 1.670 01/19/2024   HGBA1C 6.9 (H) 01/19/2024    Results for orders placed or performed in visit on 02/01/24  POC COVID-19   Collection Time: 02/01/24  2:04 PM  Result Value Ref Range   SARS Coronavirus 2 Ag Positive (A) Negative  POC Influenza A&B (Binax test)   Collection Time: 02/01/24  2:10 PM  Result Value Ref Range   Influenza A, POC Negative Negative   Influenza B, POC Negative Negative  .  Assessment & Plan:   Assessment & Plan Unintentional weight loss Labs drawn today Continue to monitor diet Continue to supplement with protein shakes Orders:   CBC with Differential/Platelet   Comprehensive metabolic panel with GFR  Urinary incontinence, unspecified type Urinary symptoms Frequent urination and occasional dark urine, risk for recurrent UTIs post-viral illness. - Obtain urine sample for UTI check. - Advise increased water intake. Orders:   POCT URINALYSIS DIP (CLINITEK)  Rhinorrhea Allergic rhinitis Chronic condition with severe symptoms this year, untreated with usual steroid shot. - Administer steroid shot for symptom relief. Orders:   triamcinolone  acetonide (KENALOG -40) injection 40 mg  Acute non-recurrent maxillary sinusitis COVID-19 Recent infection  with residual sinus congestion and nasal symptoms. - Administer steroid shot for symptom relief.    Chronic kidney disease, stage 3a (HCC) Kidney function monitoring Monitoring required due to recent health issues and urinary symptoms. - Order  kidney function tests.    Right lower lobe pulmonary nodule Lung nodule Nodule under surveillance for malignancy, history of lung disease. - Monitor for changes.    Follow-up Reassessment needed post-holiday for kidney function and health stability. - Schedule follow-up in February unless earlier review needed.  There is no height or weight on file to calculate BMI.   No orders of the defined types were placed in this encounter.   No orders of the defined types were placed in this encounter.      Follow-up: No follow-ups on file.  An After Visit Summary was printed and given to the patient.    I,Lauren M Auman,acting as a neurosurgeon for [employer].,have documented all relevant documentation on the behalf of Rebecca Angles, PA,as directed by  Rebecca Angles, PA while in the presence of Rebecca Hodge, [place].    Rebecca Hodge, [place] [facility] [phone]     [1]  Current Outpatient Medications on File Prior to Visit  Medication Sig Dispense Refill   albuterol  (PROVENTIL ) (2.5 MG/3ML) 0.083% nebulizer solution Take 3 mLs (2.5 mg total) by nebulization every 6 (six) hours as needed for wheezing or shortness of breath. 84 each 12   albuterol  (VENTOLIN  HFA) 108 (90 Base) MCG/ACT inhaler Inhale 2 puffs into the lungs every 6 (six) hours as needed for wheezing or shortness of breath. 17 each 2   alendronate  (FOSAMAX ) 70 MG tablet Take 1 tablet (70 mg total) by mouth once a week. Take with a full glass of water on an empty stomach. 12 tablet 3   BD PEN NEEDLE NANO U/F 32G X 4 MM MISC Use new needle with each injection 50 each 1   Blood Glucose Monitoring Suppl (ACCU-CHEK GUIDE) w/Device KIT 1 each by Other route daily. 1 kit 0   clotrimazole-betamethasone (LOTRISONE) cream Apply topically.     estradiol  (ESTRACE ) 0.1 MG/GM vaginal cream INSERT 1 APPLICATORFUL VAGINALLY 3 TIMES A WEEK 42.5 g 3   fluticasone  (FLONASE ) 50 MCG/ACT nasal spray Place 2 sprays into both nostrils daily.  16 g 6   gabapentin  (NEURONTIN ) 300 MG capsule Take 1 capsule (300 mg total) by mouth 3 (three) times daily. 270 capsule 2   glucose blood (ACCU-CHEK GUIDE TEST) test strip Check FBS daily 100 each 12   Lancets (ONETOUCH ULTRASOFT) lancets 1 each by Other route daily. Use as instructed 100 each 2   liraglutide  (VICTOZA ) 18 MG/3ML SOPN ADMINISTER 1.2 MG UNDER THE SKIN DAILY 9 mL 1   lisinopril  (ZESTRIL ) 20 MG tablet TAKE 1 TABLET(20 MG) BY MOUTH DAILY 90 tablet 1   loratadine  (CLARITIN ) 10 MG tablet TAKE 1 TABLET(10 MG) BY MOUTH DAILY 100 tablet 3   metoprolol  tartrate (LOPRESSOR ) 25 MG tablet Take 1 tablet (25 mg total) by mouth 2 (two) times daily. (Patient taking differently: Take 25 mg by mouth daily.) 180 tablet 2   mupirocin  ointment (BACTROBAN ) 2 % Apply topically 2 (two) times daily.     pantoprazole  (PROTONIX ) 40 MG tablet Take 1 tablet (40 mg total) by mouth daily. 90 tablet 2   rosuvastatin  (CRESTOR ) 40 MG tablet TAKE 1 TABLET(40 MG) BY MOUTH AT BEDTIME 90 tablet 2   TRESIBA  FLEXTOUCH 200 UNIT/ML FlexTouch Pen Inject 12 Units into the  skin in the morning and at bedtime. 9 mL 3   Vitamin D , Ergocalciferol , (DRISDOL ) 1.25 MG (50000 UNIT) CAPS capsule Take 1 capsule (50,000 Units total) by mouth every 7 (seven) days. 12 capsule 6   No current facility-administered medications on file prior to visit.   "

## 2024-03-02 LAB — CBC WITH DIFFERENTIAL/PLATELET
Basophils Absolute: 0.1 x10E3/uL (ref 0.0–0.2)
Basos: 1 %
EOS (ABSOLUTE): 0.3 x10E3/uL (ref 0.0–0.4)
Eos: 4 %
Hematocrit: 36.8 % (ref 34.0–46.6)
Hemoglobin: 11.6 g/dL (ref 11.1–15.9)
Immature Grans (Abs): 0 x10E3/uL (ref 0.0–0.1)
Immature Granulocytes: 0 %
Lymphocytes Absolute: 2.7 x10E3/uL (ref 0.7–3.1)
Lymphs: 37 %
MCH: 29 pg (ref 26.6–33.0)
MCHC: 31.5 g/dL (ref 31.5–35.7)
MCV: 92 fL (ref 79–97)
Monocytes Absolute: 0.7 x10E3/uL (ref 0.1–0.9)
Monocytes: 9 %
Neutrophils Absolute: 3.5 x10E3/uL (ref 1.4–7.0)
Neutrophils: 49 %
Platelets: 251 x10E3/uL (ref 150–450)
RBC: 4 x10E6/uL (ref 3.77–5.28)
RDW: 14.5 % (ref 11.7–15.4)
WBC: 7.3 x10E3/uL (ref 3.4–10.8)

## 2024-03-02 LAB — COMPREHENSIVE METABOLIC PANEL WITH GFR
ALT: 27 IU/L (ref 0–32)
AST: 36 IU/L (ref 0–40)
Albumin: 4 g/dL (ref 3.7–4.7)
Alkaline Phosphatase: 61 IU/L (ref 48–129)
BUN/Creatinine Ratio: 20 (ref 12–28)
BUN: 27 mg/dL (ref 8–27)
Bilirubin Total: 0.3 mg/dL (ref 0.0–1.2)
CO2: 23 mmol/L (ref 20–29)
Calcium: 9.1 mg/dL (ref 8.7–10.3)
Chloride: 101 mmol/L (ref 96–106)
Creatinine, Ser: 1.33 mg/dL — ABNORMAL HIGH (ref 0.57–1.00)
Globulin, Total: 2.2 g/dL (ref 1.5–4.5)
Glucose: 94 mg/dL (ref 70–99)
Potassium: 5.9 mmol/L — ABNORMAL HIGH (ref 3.5–5.2)
Sodium: 138 mmol/L (ref 134–144)
Total Protein: 6.2 g/dL (ref 6.0–8.5)
eGFR: 39 mL/min/1.73 — ABNORMAL LOW

## 2024-03-05 NOTE — Progress Notes (Signed)
 Pharmacy Quality Measure Review  This patient is appearing on a report for being at risk of failing the adherence measure for cholesterol (statin) medications this calendar year.   Medication: Rosuvastatin  40 mg Last fill date: 02/20/24 for 90 day supply, sold 02/25/24  Insurance report was not up to date. No action needed at this time.   Jenkins Graces, PharmD PGY1 Pharmacy Resident

## 2024-03-06 ENCOUNTER — Inpatient Hospital Stay (HOSPITAL_BASED_OUTPATIENT_CLINIC_OR_DEPARTMENT_OTHER): Admission: RE | Admit: 2024-03-06 | Source: Ambulatory Visit | Admitting: Radiology

## 2024-03-07 ENCOUNTER — Ambulatory Visit: Payer: Self-pay | Admitting: Family Medicine

## 2024-03-07 ENCOUNTER — Other Ambulatory Visit: Payer: Self-pay | Admitting: Family Medicine

## 2024-03-07 MED ORDER — LOSARTAN POTASSIUM 50 MG PO TABS: 50.0000 mg | ORAL_TABLET | Freq: Every day | ORAL | 0 refills | Status: AC

## 2024-03-09 ENCOUNTER — Other Ambulatory Visit

## 2024-03-11 NOTE — Assessment & Plan Note (Signed)
 Kidney function monitoring Monitoring required due to recent health issues and urinary symptoms. - Order kidney function tests.

## 2024-03-11 NOTE — Assessment & Plan Note (Signed)
 Lung nodule Nodule under surveillance for malignancy, history of lung disease. - Monitor for changes.

## 2024-03-11 NOTE — Assessment & Plan Note (Signed)
 COVID-19 Recent infection with residual sinus congestion and nasal symptoms. - Administer steroid shot for symptom relief.

## 2024-03-16 ENCOUNTER — Other Ambulatory Visit

## 2024-03-16 DIAGNOSIS — R899 Unspecified abnormal finding in specimens from other organs, systems and tissues: Secondary | ICD-10-CM

## 2024-03-17 LAB — CMP14+EGFR
ALT: 22 IU/L (ref 0–32)
AST: 27 IU/L (ref 0–40)
Albumin: 4.2 g/dL (ref 3.7–4.7)
Alkaline Phosphatase: 59 IU/L (ref 48–129)
BUN/Creatinine Ratio: 20 (ref 12–28)
BUN: 23 mg/dL (ref 8–27)
Bilirubin Total: 0.5 mg/dL (ref 0.0–1.2)
CO2: 23 mmol/L (ref 20–29)
Calcium: 9.1 mg/dL (ref 8.7–10.3)
Chloride: 105 mmol/L (ref 96–106)
Creatinine, Ser: 1.14 mg/dL — ABNORMAL HIGH (ref 0.57–1.00)
Globulin, Total: 2.3 g/dL (ref 1.5–4.5)
Glucose: 93 mg/dL (ref 70–99)
Potassium: 4.5 mmol/L (ref 3.5–5.2)
Sodium: 140 mmol/L (ref 134–144)
Total Protein: 6.5 g/dL (ref 6.0–8.5)
eGFR: 47 mL/min/1.73 — ABNORMAL LOW

## 2024-03-18 ENCOUNTER — Ambulatory Visit: Payer: Self-pay | Admitting: Family Medicine

## 2024-03-20 ENCOUNTER — Other Ambulatory Visit: Payer: Self-pay

## 2024-03-20 DIAGNOSIS — E875 Hyperkalemia: Secondary | ICD-10-CM

## 2024-03-21 ENCOUNTER — Other Ambulatory Visit: Payer: Self-pay

## 2024-03-21 ENCOUNTER — Ambulatory Visit

## 2024-03-21 VITALS — BP 132/78

## 2024-03-21 DIAGNOSIS — R634 Abnormal weight loss: Secondary | ICD-10-CM

## 2024-03-21 DIAGNOSIS — E875 Hyperkalemia: Secondary | ICD-10-CM

## 2024-03-21 DIAGNOSIS — R911 Solitary pulmonary nodule: Secondary | ICD-10-CM

## 2024-03-21 LAB — COMPREHENSIVE METABOLIC PANEL WITH GFR
ALT: 19 IU/L (ref 0–32)
AST: 22 IU/L (ref 0–40)
Albumin: 3.8 g/dL (ref 3.7–4.7)
Alkaline Phosphatase: 60 IU/L (ref 48–129)
BUN/Creatinine Ratio: 18 (ref 12–28)
BUN: 20 mg/dL (ref 8–27)
Bilirubin Total: 0.6 mg/dL (ref 0.0–1.2)
CO2: 24 mmol/L (ref 20–29)
Calcium: 9.3 mg/dL (ref 8.7–10.3)
Chloride: 102 mmol/L (ref 96–106)
Creatinine, Ser: 1.12 mg/dL — ABNORMAL HIGH (ref 0.57–1.00)
Globulin, Total: 2.2 g/dL (ref 1.5–4.5)
Glucose: 86 mg/dL (ref 70–99)
Potassium: 4.9 mmol/L (ref 3.5–5.2)
Sodium: 139 mmol/L (ref 134–144)
Total Protein: 6 g/dL (ref 6.0–8.5)
eGFR: 48 mL/min/1.73 — ABNORMAL LOW

## 2024-03-26 ENCOUNTER — Ambulatory Visit: Payer: Self-pay | Admitting: Physician Assistant

## 2024-04-05 ENCOUNTER — Other Ambulatory Visit: Payer: Self-pay | Admitting: Physician Assistant

## 2024-04-11 NOTE — Telephone Encounter (Signed)
 FYI... Patient is requesting  a cream.  Copied from CRM #[id number]. Topic: Clinical - Prescription Issue >> Apr 11, 2024 12:01 PM Rebecca Hodge wrote: Reason for CRM: Daughter called.Rebecca Hodge looking for script Diclofenac  100mg .. She is experiencing itching, burning, sore - when she pees.. -- not sure if we want to call and check in on her.. but daughter says this works for her.

## 2024-04-12 ENCOUNTER — Other Ambulatory Visit: Payer: Self-pay | Admitting: Family Medicine

## 2024-04-12 DIAGNOSIS — J069 Acute upper respiratory infection, unspecified: Secondary | ICD-10-CM

## 2024-04-16 ENCOUNTER — Other Ambulatory Visit: Payer: Self-pay | Admitting: Physician Assistant

## 2024-05-03 ENCOUNTER — Other Ambulatory Visit (HOSPITAL_BASED_OUTPATIENT_CLINIC_OR_DEPARTMENT_OTHER): Admitting: Radiology

## 2024-05-31 ENCOUNTER — Ambulatory Visit: Admitting: Physician Assistant
